# Patient Record
Sex: Male | Born: 1945
Health system: Southern US, Community
[De-identification: ages and names within clinical notes are randomized; demographics above are authoritative.]

## PROBLEM LIST (undated history)

## (undated) DIAGNOSIS — Z720 Tobacco use: Secondary | ICD-10-CM

## (undated) DIAGNOSIS — E559 Vitamin D deficiency, unspecified: Secondary | ICD-10-CM

## (undated) DIAGNOSIS — I10 Essential (primary) hypertension: Secondary | ICD-10-CM

## (undated) DIAGNOSIS — E119 Type 2 diabetes mellitus without complications: Secondary | ICD-10-CM

## (undated) DIAGNOSIS — E785 Hyperlipidemia, unspecified: Secondary | ICD-10-CM

## (undated) HISTORY — DX: Tobacco use: Z72.0

## (undated) HISTORY — DX: Vitamin D deficiency, unspecified: E55.9

## (undated) HISTORY — DX: Essential (primary) hypertension: I10

## (undated) HISTORY — DX: Type 2 diabetes mellitus without complications: E11.9

## (undated) HISTORY — PX: CATARACT EXTRACTION: SUR2

## (undated) HISTORY — DX: Hyperlipidemia, unspecified: E78.5

---

## 2013-05-15 ENCOUNTER — Ambulatory Visit (INDEPENDENT_AMBULATORY_CARE_PROVIDER_SITE_OTHER): Payer: Medicare Other | Admitting: Family Medicine

## 2013-05-15 ENCOUNTER — Encounter: Payer: Self-pay | Admitting: Family Medicine

## 2013-05-15 VITALS — BP 143/87 | HR 74 | Temp 97.6°F | Ht 70.0 in | Wt 186.0 lb

## 2013-05-15 DIAGNOSIS — E119 Type 2 diabetes mellitus without complications: Secondary | ICD-10-CM

## 2013-05-15 DIAGNOSIS — E785 Hyperlipidemia, unspecified: Secondary | ICD-10-CM

## 2013-05-15 DIAGNOSIS — I10 Essential (primary) hypertension: Secondary | ICD-10-CM

## 2013-05-15 LAB — COMPLETE METABOLIC PANEL WITH GFR
ALT: 19 U/L (ref 0–53)
AST: 17 U/L (ref 0–37)
Albumin: 4.6 g/dL (ref 3.5–5.2)
Alkaline Phosphatase: 53 U/L (ref 39–117)
BUN: 12 mg/dL (ref 6–23)
CO2: 28 mEq/L (ref 19–32)
Calcium: 9.2 mg/dL (ref 8.4–10.5)
Chloride: 91 mEq/L — ABNORMAL LOW (ref 96–112)
Creat: 0.8 mg/dL (ref 0.50–1.35)
GFR, Est African American: >89 mL/min
GFR, Est Non African American: >89 mL/min
Glucose, Bld: 107 mg/dL — ABNORMAL HIGH (ref 70–99)
Potassium: 5.1 mEq/L (ref 3.5–5.3)
Sodium: 130 mEq/L — ABNORMAL LOW (ref 135–145)
Total Bilirubin: 0.6 mg/dL (ref 0.3–1.2)
Total Protein: 7.2 g/dL (ref 6.0–8.3)

## 2013-05-15 LAB — POCT GLYCOSYLATED HEMOGLOBIN (HGB A1C): Hemoglobin A1C: 6.1

## 2013-05-15 MED ORDER — GLIPIZIDE 5 MG PO TABS: 5.0000 mg | ORAL_TABLET | Freq: Two times a day (BID) | ORAL | Status: DC

## 2013-05-15 MED ORDER — METOPROLOL TARTRATE 50 MG PO TABS: 50.0000 mg | ORAL_TABLET | Freq: Two times a day (BID) | ORAL | Status: DC

## 2013-05-15 MED ORDER — SITAGLIPTIN PHOSPHATE 100 MG PO TABS: 100.0000 mg | ORAL_TABLET | Freq: Every day | ORAL | Status: DC

## 2013-05-15 MED ORDER — AMLODIPINE BESYLATE 5 MG PO TABS: 5.0000 mg | ORAL_TABLET | Freq: Every day | ORAL | Status: DC

## 2013-05-15 NOTE — Progress Notes (Signed)
Patient ID: Ray Carlson, male   DOB: 1946/04/26, 67 y.o.   MRN: [id number] SUBJECTIVE: Chief Complaint  Patient presents with  . Diabetes    3-4 MO F/U acm PT  . Hypertension  . Hypothyroidism     HPI: Patient is here for follow up of Diabetes Mellitus/hypertension/hyperlipidemia: Symptoms of DM: Denies Nocturia ,Denies Urinary Frequency , denies Blurred vision ,deniesDizziness,denies.Dysuria,denies paresthesias, denies extremity pain or ulcers.Marland Kitchendenies chest pain. has had an annual eye exam. do check the feet. Does check CBGs. Average CBG:135 this am Denies episodes of hypoglycemia. Does have an emergency hypoglycemic plan. admits toCompliance with medications. Denies Problems with medications.   PMH/PSH: reviewed/updated in Epic  SH/FH: reviewed/updated in Epic  Allergies: reviewed/updated in Epic  Medications: reviewed/updated in Epic  Immunizations: reviewed/updated in Epic  ROS: As above in the HPI. All other systems are stable or negative.  OBJECTIVE: APPEARANCE:  Patient in no acute distress.The patient appeared well nourished and normally developed. Acyanotic. Waist: VITAL SIGNS:BP 143/87  Pulse 74  Temp(Src) 97.6 F (36.4 C) (Oral)  Ht 5\' 10"  (1.778 m)  Wt 186 lb (84.369 kg)  BMI 26.69 kg/m2   SKIN: warm and  Dry without overt rashes, tattoos and scars  HEAD and Neck: without JVD, Head and scalp: normal Eyes:No scleral icterus. Fundi normal, eye movements normal. Ears: Auricle normal, canal normal, Tympanic membranes normal, insufflation normal. Nose: normal Throat: normal Neck & thyroid: normal  CHEST & LUNGS: Chest wall: normal Lungs: Clear  CVS: Reveals the PMI to be normally located. Regular rhythm, First and Second Heart sounds are normal,  absence of murmurs, rubs or gallops. Peripheral vasculature: Radial pulses: normal Dorsal pedis pulses: normal Posterior pulses: normal  ABDOMEN:  Appearance: normal Benign, no organomegaly, no  masses, no Abdominal Aortic enlargement. No Guarding , no rebound. No Bruits. Bowel sounds: normal  RECTAL: N/A GU: N/A  EXTREMETIES: nonedematous. Both Femoral and Pedal pulses are normal.  MUSCULOSKELETAL:  Spine: normal Joints: intact  NEUROLOGIC: oriented to time,place and person; nonfocal. Strength is normal Sensory is normal Reflexes are normal Cranial Nerves are normal.  ASSESSMENT: DM (diabetes mellitus) - Plan: POCT glycosylated hemoglobin (Hb A1C), COMPLETE METABOLIC PANEL WITH GFR, sitaGLIPtin (JANUVIA) 100 MG tablet, glipiZIDE (GLUCOTROL) 5 MG tablet, CANCELED: POCT UA - Microalbumin  HTN (hypertension) - Plan: COMPLETE METABOLIC PANEL WITH GFR, metoprolol (LOPRESSOR) 50 MG tablet, amLODipine (NORVASC) 5 MG tablet  HLD (hyperlipidemia) - Plan: COMPLETE METABOLIC PANEL WITH GFR, NMR Lipoprofile with Lipids  Patient doesn't have a Dx of hypothyroidism  PLAN: Orders Placed This Encounter  Procedures  . COMPLETE METABOLIC PANEL WITH GFR  . NMR Lipoprofile with Lipids  . POCT glycosylated hemoglobin (Hb A1C)   Meds ordered this encounter  Medications  . DISCONTD: amLODipine (NORVASC) 5 MG tablet    Sig: Take 1 tablet by mouth daily.  Marland Kitchen DISCONTD: glipiZIDE (GLUCOTROL) 5 MG tablet    Sig: Take 1 tablet by mouth 2 (two) times daily.  Marland Kitchen BAYER CONTOUR TEST test strip    Sig:   . lisinopril-hydrochlorothiazide (PRINZIDE,ZESTORETIC) 20-12.5 MG per tablet    Sig: Take 1 tablet by mouth daily.  . metFORMIN (GLUCOPHAGE) 500 MG tablet    Sig: Take 1 tablet by mouth 2 (two) times daily.  Marland Kitchen DISCONTD: metoprolol (LOPRESSOR) 50 MG tablet    Sig: Take 1 tablet by mouth 2 (two) times daily.  . pravastatin (PRAVACHOL) 40 MG tablet    Sig: Take 1 tablet by mouth at bedtime.  Marland Kitchen  DISCONTD: JANUVIA 100 MG tablet    Sig: Take 1 tablet by mouth daily.  . Cholecalciferol (VITAMIN D) 2000 UNITS CAPS    Sig: Take 2,000 Units by mouth daily.  . metoprolol (LOPRESSOR) 50 MG tablet     Sig: Take 1 tablet (50 mg total) by mouth 2 (two) times daily.    Dispense:  60 tablet    Refill:  11  . sitaGLIPtin (JANUVIA) 100 MG tablet    Sig: Take 1 tablet (100 mg total) by mouth daily.    Dispense:  30 tablet    Refill:  11  . amLODipine (NORVASC) 5 MG tablet    Sig: Take 1 tablet (5 mg total) by mouth daily.    Dispense:  30 tablet    Refill:  11  . glipiZIDE (GLUCOTROL) 5 MG tablet    Sig: Take 1 tablet (5 mg total) by mouth 2 (two) times daily.    Dispense:  60 tablet    Refill:  11   Results for orders placed in visit on 05/15/13  POCT GLYCOSYLATED HEMOGLOBIN (HGB A1C)      Result Value Range   Hemoglobin A1C 6.1%     Discussed goals of therapy  Return in about 3 months (around 08/15/2013).  Nayelli Inglis P. Modesto Charon, M.D.

## 2013-05-16 ENCOUNTER — Other Ambulatory Visit: Payer: Self-pay | Admitting: Family Medicine

## 2013-05-16 DIAGNOSIS — E871 Hypo-osmolality and hyponatremia: Secondary | ICD-10-CM

## 2013-05-16 LAB — NMR LIPOPROFILE WITH LIPIDS
Cholesterol, Total: 116 mg/dL (ref <200)
HDL Particle Number: 26.6 umol/L — ABNORMAL LOW (ref >=30.5)
HDL Size: 9 nm — ABNORMAL LOW (ref >=9.2)
HDL-C: 32 mg/dL — ABNORMAL LOW (ref >=40)
LDL (calc): 54 mg/dL (ref <100)
LDL Particle Number: 1001 nmol/L — ABNORMAL HIGH (ref <1000)
LDL Size: 19.8 nm — ABNORMAL LOW (ref >20.5)
LP-IR Score: 75 — ABNORMAL HIGH (ref <=45)
Large HDL-P: 3.7 umol/L — ABNORMAL LOW (ref >=4.8)
Large VLDL-P: 6.4 nmol/L — ABNORMAL HIGH (ref <=2.7)
Small LDL Particle Number: 809 nmol/L — ABNORMAL HIGH (ref <=527)
Triglycerides: 148 mg/dL (ref <150)
VLDL Size: 61.6 nm — ABNORMAL HIGH (ref <=46.6)

## 2013-05-16 NOTE — Progress Notes (Signed)
Quick Note:  Lab result at goal.for DM and lipids. However, the sodium is very low. Will need to to recheck this in 1 week. Ordered in EPIC. No change in Medications for now.  ______

## 2013-05-29 ENCOUNTER — Telehealth: Payer: Self-pay | Admitting: Family Medicine

## 2013-05-29 MED ORDER — METFORMIN HCL 500 MG PO TABS: 500.0000 mg | ORAL_TABLET | Freq: Two times a day (BID) | ORAL | Status: DC

## 2013-05-29 NOTE — Telephone Encounter (Signed)
done

## 2013-05-30 ENCOUNTER — Telehealth: Payer: Self-pay | Admitting: Family Medicine

## 2013-05-30 MED ORDER — LISINOPRIL-HYDROCHLOROTHIAZIDE 20-12.5 MG PO TABS: 1.0000 | ORAL_TABLET | Freq: Every day | ORAL | Status: DC

## 2013-05-30 NOTE — Telephone Encounter (Signed)
done

## 2013-05-31 ENCOUNTER — Telehealth: Payer: Self-pay | Admitting: Family Medicine

## 2013-07-16 ENCOUNTER — Other Ambulatory Visit: Payer: Self-pay | Admitting: *Deleted

## 2013-07-16 MED ORDER — PRAVASTATIN SODIUM 40 MG PO TABS: 40.0000 mg | ORAL_TABLET | Freq: Every day | ORAL | Status: DC

## 2013-08-16 ENCOUNTER — Ambulatory Visit (INDEPENDENT_AMBULATORY_CARE_PROVIDER_SITE_OTHER): Payer: Medicare Other | Admitting: Family Medicine

## 2013-08-16 ENCOUNTER — Encounter: Payer: Self-pay | Admitting: Family Medicine

## 2013-08-16 VITALS — BP 129/82 | HR 85 | Temp 98.9°F | Ht 71.0 in | Wt 182.4 lb

## 2013-08-16 DIAGNOSIS — F17200 Nicotine dependence, unspecified, uncomplicated: Secondary | ICD-10-CM

## 2013-08-16 DIAGNOSIS — E1159 Type 2 diabetes mellitus with other circulatory complications: Secondary | ICD-10-CM | POA: Insufficient documentation

## 2013-08-16 DIAGNOSIS — I10 Essential (primary) hypertension: Secondary | ICD-10-CM

## 2013-08-16 DIAGNOSIS — E1169 Type 2 diabetes mellitus with other specified complication: Secondary | ICD-10-CM | POA: Insufficient documentation

## 2013-08-16 DIAGNOSIS — E785 Hyperlipidemia, unspecified: Secondary | ICD-10-CM

## 2013-08-16 DIAGNOSIS — Z720 Tobacco use: Secondary | ICD-10-CM | POA: Insufficient documentation

## 2013-08-16 DIAGNOSIS — I152 Hypertension secondary to endocrine disorders: Secondary | ICD-10-CM | POA: Insufficient documentation

## 2013-08-16 DIAGNOSIS — E119 Type 2 diabetes mellitus without complications: Secondary | ICD-10-CM

## 2013-08-16 DIAGNOSIS — E559 Vitamin D deficiency, unspecified: Secondary | ICD-10-CM

## 2013-08-16 LAB — POCT GLYCOSYLATED HEMOGLOBIN (HGB A1C): Hemoglobin A1C: 6.1

## 2013-08-16 NOTE — Progress Notes (Signed)
Patient ID: Ray Carlson, male   DOB: Feb 14, 1946, 67 y.o.   MRN: [id number] SUBJECTIVE: CC: Chief Complaint  Patient presents with  . Follow-up    3 month follow up chronic problems refill metformin, pravastatin    HPI: Patient is here for follow up of Diabetes Mellitus/HLD/HTN: Symptoms evaluated: Denies Nocturia ,Denies Urinary Frequency , denies Blurred vision ,deniesDizziness,denies.Dysuria,denies paresthesias, denies extremity pain or ulcers.Marland Kitchendenies chest pain. has had an annual eye exam. do check the feet. Does check CBGs. Average WUJ:WJXBJY check regularly Denies episodes of hypoglycemia. Does have an emergency hypoglycemic plan. admits toCompliance with medications. Denies Problems with medications.  Tobacco use: continues to smoke at least one daily. Not ready to tally stop  Past Medical History  Diagnosis Date  . Hyperlipidemia   . Hypertension   . Diabetes mellitus without complication   . Tobacco user   . Vitamin D deficiency    No past surgical history on file. History   Social History  . Marital Status: Divorced    Spouse Name: N/A    Number of Children: N/A  . Years of Education: N/A   Occupational History  . Not on file.   Social History Main Topics  . Smoking status: Current Every Day Smoker -- 0.25 packs/day    Types: Cigarettes  . Smokeless tobacco: Not on file  . Alcohol Use: No  . Drug Use: No  . Sexual Activity: Not on file   Other Topics Concern  . Not on file   Social History Narrative  . No narrative on file   Family History  Problem Relation Age of Onset  . Hypertension Mother   . Heart disease Mother   . Hypertension Father    Current Outpatient Prescriptions on File Prior to Visit  Medication Sig Dispense Refill  . amLODipine (NORVASC) 5 MG tablet Take 1 tablet (5 mg total) by mouth daily.  30 tablet  11  . BAYER CONTOUR TEST test strip       . Cholecalciferol (VITAMIN D) 2000 UNITS CAPS Take 2,000 Units by mouth daily.       Marland Kitchen glipiZIDE (GLUCOTROL) 5 MG tablet Take 1 tablet (5 mg total) by mouth 2 (two) times daily.  60 tablet  11  . lisinopril-hydrochlorothiazide (PRINZIDE,ZESTORETIC) 20-12.5 MG per tablet Take 1 tablet by mouth daily.  30 tablet  5  . metFORMIN (GLUCOPHAGE) 500 MG tablet Take 1 tablet (500 mg total) by mouth 2 (two) times daily.  60 tablet  2  . metoprolol (LOPRESSOR) 50 MG tablet Take 1 tablet (50 mg total) by mouth 2 (two) times daily.  60 tablet  11  . pravastatin (PRAVACHOL) 40 MG tablet Take 1 tablet (40 mg total) by mouth at bedtime.  30 tablet  2  . sitaGLIPtin (JANUVIA) 100 MG tablet Take 1 tablet (100 mg total) by mouth daily.  30 tablet  11   No current facility-administered medications on file prior to visit.   Allergies  Allergen Reactions  . Penicillins    Immunization History  Administered Date(s) Administered  . Pneumococcal Polysaccharide 08/12/2010   Prior to Admission medications   Medication Sig Start Date End Date Taking? Authorizing Provider  amLODipine (NORVASC) 5 MG tablet Take 1 tablet (5 mg total) by mouth daily. 05/15/13  Yes Ileana Ladd, MD  BAYER CONTOUR TEST test strip  02/25/13  Yes Historical Provider, MD  Cholecalciferol (VITAMIN D) 2000 UNITS CAPS Take 2,000 Units by mouth daily.   Yes Historical Provider, MD  glipiZIDE (GLUCOTROL) 5 MG tablet Take 1 tablet (5 mg total) by mouth 2 (two) times daily. 05/15/13  Yes Ileana Ladd, MD  lisinopril-hydrochlorothiazide (PRINZIDE,ZESTORETIC) 20-12.5 MG per tablet Take 1 tablet by mouth daily. 05/30/13  Yes Ileana Ladd, MD  metFORMIN (GLUCOPHAGE) 500 MG tablet Take 1 tablet (500 mg total) by mouth 2 (two) times daily. 05/29/13  Yes Ileana Ladd, MD  metoprolol (LOPRESSOR) 50 MG tablet Take 1 tablet (50 mg total) by mouth 2 (two) times daily. 05/15/13  Yes Ileana Ladd, MD  pravastatin (PRAVACHOL) 40 MG tablet Take 1 tablet (40 mg total) by mouth at bedtime. 07/16/13  Yes Ernestina Penna, MD  sitaGLIPtin (JANUVIA) 100  MG tablet Take 1 tablet (100 mg total) by mouth daily. 05/15/13  Yes Ileana Ladd, MD    ROS: As above in the HPI. All other systems are stable or negative.  OBJECTIVE: APPEARANCE:  Patient in no acute distress.The patient appeared well nourished and normally developed. Acyanotic. Waist: VITAL SIGNS:BP 129/82  Pulse 85  Temp(Src) 98.9 F (37.2 C) (Oral)  Ht 5\' 11"  (1.803 m)  Wt 182 lb 6.4 oz (82.736 kg)  BMI 25.45 kg/m2  WM  Ambulates with a cane    SKIN: warm and  Dry without overt rashes, tattoos and scars  HEAD and Neck: without JVD, Head and scalp: normal Eyes:No scleral icterus. Fundi normal, eye movements normal. Ears: Auricle normal, canal normal, Tympanic membranes normal, insufflation normal. Nose: normal Throat: normal Neck & thyroid: normal  CHEST & LUNGS: Chest wall: normal Lungs: Clear  CVS: Reveals the PMI to be normally located. Regular rhythm, First and Second Heart sounds are normal,  absence of murmurs, rubs or gallops. Peripheral vasculature: Radial pulses: normal Dorsal pedis pulses: normal Posterior pulses: normal  ABDOMEN:  Appearance: normal Benign, no organomegaly, no masses, no Abdominal Aortic enlargement. No Guarding , no rebound. No Bruits. Bowel sounds: normal  RECTAL: N/A GU: N/A  EXTREMETIES: nonedematous.  MUSCULOSKELETAL:  Spine: normal Joints: intact  NEUROLOGIC: oriented to time,place and person; nonfocal.  Results for orders placed in visit on 05/15/13  COMPLETE METABOLIC PANEL WITH GFR      Result Value Range   Sodium 130 (*) 135 - 145 mEq/L   Potassium 5.1  3.5 - 5.3 mEq/L   Chloride 91 (*) 96 - 112 mEq/L   CO2 28  19 - 32 mEq/L   Glucose, Bld 107 (*) 70 - 99 mg/dL   BUN 12  6 - 23 mg/dL   Creat 1.02  7.25 - 3.66 mg/dL   Total Bilirubin 0.6  0.3 - 1.2 mg/dL   Alkaline Phosphatase 53  39 - 117 U/L   AST 17  0 - 37 U/L   ALT 19  0 - 53 U/L   Total Protein 7.2  6.0 - 8.3 g/dL   Albumin 4.6  3.5 - 5.2  g/dL   Calcium 9.2  8.4 - 44.0 mg/dL   GFR, Est African American >89     GFR, Est Non African American >89    NMR LIPOPROFILE WITH LIPIDS      Result Value Range   LDL Particle Number 1001 (*) <1000 nmol/L   LDL (calc) 54  <100 mg/dL   HDL-C 32 (*) >=34 mg/dL   Triglycerides 742  <595 mg/dL   Cholesterol, Total 638  <200 mg/dL   HDL Particle Number 75.6 (*) >=30.5 umol/L   Large HDL-P 3.7 (*) >=4.8 umol/L   Large  VLDL-P 6.4 (*) <=2.7 nmol/L   Small LDL Particle Number [id number] (*) <=527 nmol/L   LDL Size 19.8 (*) >20.5 nm   HDL Size 9.0 (*) >=9.2 nm   VLDL Size 61.6 (*) <=46.6 nm   LP-IR Score 75 (*) <=45  POCT GLYCOSYLATED HEMOGLOBIN (HGB A1C)      Result Value Range   Hemoglobin A1C 6.1%      ASSESSMENT: Diabetes mellitus without complication - Plan: POCT glycosylated hemoglobin (Hb A1C), CMP14+EGFR  Hyperlipidemia - Plan: CMP14+EGFR, NMR, lipoprofile  Hypertension - Plan: CMP14+EGFR  Tobacco user  Vitamin D deficiency - Plan: Vit D  25 hydroxy (rtn osteoporosis monitoring)   PLAN: Orders Placed This Encounter  Procedures  . CMP14+EGFR  . NMR, lipoprofile  . Vit D  25 hydroxy (rtn osteoporosis monitoring)  . POCT glycosylated hemoglobin (Hb A1C)         Dr Woodroe Mode Recommendations  For nutrition information, I recommend books:  1).Eat to Live by Dr Monico Hoar. 2).Prevent and Reverse Heart Disease by Dr Suzzette Righter. 3) Dr Katherina Right Book:  Program to Reverse Diabetes  Exercise recommendations are:  If unable to walk, then the patient can exercise in a chair 3 times a day. By flapping arms like a bird gently and raising legs outwards to the front.  If ambulatory, the patient can go for walks for 30 minutes 3 times a week. Then increase the intensity and duration as tolerated.  Goal is to try to attain exercise frequency to 5 times a week.  If applicable: Best to perform resistance exercises (machines or weights) 2 days a week and cardio type  exercises 3 days per week.  Smoking cessation and foot care discussed and handout generated in the AVS. Same medications for now. Await lab results.  Return in about 3 months (around 11/15/2013) for Recheck medical problems.  Kojo Liby P. [place] Charon, M.D.

## 2013-08-16 NOTE — Patient Instructions (Signed)
Smoking Cessation Quitting smoking is important to your health and has many advantages. However, it is not always easy to quit since nicotine is a very addictive drug. Often times, people try 3 times or more before being able to quit. This document explains the best ways for you to prepare to quit smoking. Quitting takes hard work and a lot of effort, but you can do it. ADVANTAGES OF QUITTING SMOKING  You will live longer, feel better, and live better.  Your body will feel the impact of quitting smoking almost immediately.  Within 20 minutes, blood pressure decreases. Your pulse returns to its normal level.  After 8 hours, carbon monoxide levels in the blood return to normal. Your oxygen level increases.  After 24 hours, the chance of having a heart attack starts to decrease. Your breath, hair, and body stop smelling like smoke.  After 48 hours, damaged nerve endings begin to recover. Your sense of taste and smell improve.  After 72 hours, the body is virtually free of nicotine. Your bronchial tubes relax and breathing becomes easier.  After 2 to 12 weeks, lungs can hold more air. Exercise becomes easier and circulation improves.  The risk of having a heart attack, stroke, cancer, or lung disease is greatly reduced.  After 1 year, the risk of coronary heart disease is cut in half.  After 5 years, the risk of stroke falls to the same as a nonsmoker.  After 10 years, the risk of lung cancer is cut in half and the risk of other cancers decreases significantly.  After 15 years, the risk of coronary heart disease drops, usually to the level of a nonsmoker.  If you are pregnant, quitting smoking will improve your chances of having a healthy baby.  The people you live with, especially any children, will be healthier.  You will have extra money to spend on things other than cigarettes. QUESTIONS TO THINK ABOUT BEFORE ATTEMPTING TO QUIT You may want to talk about your answers with your  caregiver.  Why do you want to quit?  If you tried to quit in the past, what helped and what did not?  What will be the most difficult situations for you after you quit? How will you plan to handle them?  Who can help you through the tough times? Your family? Friends? A caregiver?  What pleasures do you get from smoking? What ways can you still get pleasure if you quit? Here are some questions to ask your caregiver:  How can you help me to be successful at quitting?  What medicine do you think would be best for me and how should I take it?  What should I do if I need more help?  What is smoking withdrawal like? How can I get information on withdrawal? GET READY  Set a quit date.  Change your environment by getting rid of all cigarettes, ashtrays, matches, and lighters in your home, car, or work. Do not let people smoke in your home.  Review your past attempts to quit. Think about what worked and what did not. GET SUPPORT AND ENCOURAGEMENT You have a better chance of being successful if you have help. You can get support in many ways.  Tell your family, friends, and co-workers that you are going to quit and need their support. Ask them not to smoke around you.  Get individual, group, or telephone counseling and support. Programs are available at local hospitals and health centers. Call your local health department for   information about programs in your area.  Spiritual beliefs and practices may help some smokers quit.  Download a "quit meter" on your computer to keep track of quit statistics, such as how long you have gone without smoking, cigarettes not smoked, and money saved.  Get a self-help book about quitting smoking and staying off of tobacco. LEARN NEW SKILLS AND BEHAVIORS  Distract yourself from urges to smoke. Talk to someone, go for a walk, or occupy your time with a task.  Change your normal routine. Take a different route to work. Drink tea instead of coffee.  Eat breakfast in a different place.  Reduce your stress. Take a hot bath, exercise, or read a book.  Plan something enjoyable to do every day. Reward yourself for not smoking.  Explore interactive web-based programs that specialize in helping you quit. GET MEDICINE AND USE IT CORRECTLY Medicines can help you stop smoking and decrease the urge to smoke. Combining medicine with the above behavioral methods and support can greatly increase your chances of successfully quitting smoking.  Nicotine replacement therapy helps deliver nicotine to your body without the negative effects and risks of smoking. Nicotine replacement therapy includes nicotine gum, lozenges, inhalers, nasal sprays, and skin patches. Some may be available over-the-counter and others require a prescription.  Antidepressant medicine helps people abstain from smoking, but how this works is unknown. This medicine is available by prescription.  Nicotinic receptor partial agonist medicine simulates the effect of nicotine in your brain. This medicine is available by prescription. Ask your caregiver for advice about which medicines to use and how to use them based on your health history. Your caregiver will tell you what side effects to look out for if you choose to be on a medicine or therapy. Carefully read the information on the package. Do not use any other product containing nicotine while using a nicotine replacement product.  RELAPSE OR DIFFICULT SITUATIONS Most relapses occur within the first 3 months after quitting. Do not be discouraged if you start smoking again. Remember, most people try several times before finally quitting. You may have symptoms of withdrawal because your body is used to nicotine. You may crave cigarettes, be irritable, feel very hungry, cough often, get headaches, or have difficulty concentrating. The withdrawal symptoms are only temporary. They are strongest when you first quit, but they will go away within  10 14 days. To reduce the chances of relapse, try to:  Avoid drinking alcohol. Drinking lowers your chances of successfully quitting.  Reduce the amount of caffeine you consume. Once you quit smoking, the amount of caffeine in your body increases and can give you symptoms, such as a rapid heartbeat, sweating, and anxiety.  Avoid smokers because they can make you want to smoke.  Do not let weight gain distract you. Many smokers will gain weight when they quit, usually less than 10 pounds. Eat a healthy diet and stay active. You can always lose the weight gained after you quit.  Find ways to improve your mood other than smoking. FOR MORE INFORMATION  www.smokefree.gov  Document Released: 11/22/2001 Document Revised: 05/29/2012 Document Reviewed: 03/08/2012 [facility] Patient Information 2014 [place], [place].   Diabetes and Foot Care Diabetes may cause you to have a poor blood supply (circulation) to your legs and feet. Because of this, the skin may be thinner, break easier, and heal more slowly. You also may have nerve damage in your legs and feet causing decreased feeling. You may not notice minor injuries to your  feet that could lead to serious problems or infections. Taking care of your feet is one of the most important things you can do for yourself.  HOME CARE INSTRUCTIONS  Do not go barefoot. Bare feet are easily injured.  Check your feet daily for blisters, cuts, and redness.  Wash your feet with warm water (not hot) and mild soap. Pat your feet and between your toes until completely dry.  Apply a moisturizing lotion that does not contain alcohol or petroleum jelly to the dry skin on your feet and to dry brittle toenails. Do not put it between your toes.  Trim your toenails straight across. Do not dig under them or around the cuticle.  Do not cut corns or calluses, or try to remove them with medicine.  Wear clean cotton socks or stockings every day. Make sure they are not too  tight. Do not wear knee high stockings since they may decrease blood flow to your legs.  Wear leather shoes that fit properly and have enough cushioning. To break in new shoes, wear them just a few hours a day to avoid injuring your feet.  Wear shoes at all times, even in the house.  Do not cross your legs. This may decrease the blood flow to your feet.  If you find a minor scrape, cut, or break in the skin on your feet, keep it and the skin around it clean and dry. These areas may be cleansed with mild soap and water. Do not use peroxide, alcohol, iodine or Merthiolate.  When you remove an adhesive bandage, be sure not to harm the skin around it.  If you have a wound, look at it several times a day to make sure it is healing.  Do not use heating pads or hot water bottles. Burns can occur. If you have lost feeling in your feet or legs, you may not know it is happening until it is too late.  Report any cuts, sores or bruises to your caregiver. Do not wait! SEEK MEDICAL CARE IF:   You have an injury that is not healing or you notice redness, numbness, burning, or tingling.  Your feet always feel cold.  You have pain or cramps in your legs and feet. SEEK IMMEDIATE MEDICAL CARE IF:   There is increasing redness, swelling, or increasing pain in the wound.  There is a red line that goes up your leg.  Pus is coming from a wound.  You develop an unexplained oral temperature above 102 F (38.9 C), or as your caregiver suggests.  You notice a bad smell coming from an ulcer or wound. MAKE SURE YOU:   Understand these instructions.  Will watch your condition.  Will get help right away if you are not doing well or get worse. Document Released: 11/25/2000 Document Revised: 02/20/2012 Document Reviewed: 06/03/2009 [facility] Patient Information 2014 [place], [place].        Dr Woodroe Mode Recommendations  For nutrition information, I recommend books:  1).Eat to Live by Dr Monico Hoar. 2).Prevent and Reverse Heart Disease by Dr Suzzette Righter. 3) Dr Katherina Right Book:  Program to Reverse Diabetes  Exercise recommendations are:  If unable to walk, then the patient can exercise in a chair 3 times a day. By flapping arms like a bird gently and raising legs outwards to the front.  If ambulatory, the patient can go for walks for 30 minutes 3 times a week. Then increase the intensity and duration as tolerated.  Goal  is to try to attain exercise frequency to 5 times a week.  If applicable: Best to perform resistance exercises (machines or weights) 2 days a week and cardio type exercises 3 days per week.

## 2013-08-18 LAB — NMR, LIPOPROFILE
Cholesterol: 115 mg/dL (ref <200)
HDL Cholesterol by NMR: 36 mg/dL — ABNORMAL LOW (ref >=40)
HDL Particle Number: 27.2 umol/L — ABNORMAL LOW (ref >=30.5)
LDL Particle Number: 1206 nmol/L — ABNORMAL HIGH (ref <1000)
LDL Size: 20.2 nm — ABNORMAL LOW (ref >20.5)
LDLC SERPL CALC-MCNC: 59 mg/dL (ref <100)
LP-IR Score: 55 — ABNORMAL HIGH (ref <=45)
Small LDL Particle Number: 840 nmol/L — ABNORMAL HIGH (ref <=527)
Triglycerides by NMR: 102 mg/dL (ref <150)

## 2013-08-18 LAB — CMP14+EGFR
ALT: 21 IU/L (ref 0–44)
AST: 15 IU/L (ref 0–40)
Albumin/Globulin Ratio: 2 (ref 1.1–2.5)
Albumin: 4.9 g/dL — ABNORMAL HIGH (ref 3.6–4.8)
Alkaline Phosphatase: 49 IU/L (ref 39–117)
BUN/Creatinine Ratio: 19 (ref 10–22)
BUN: 15 mg/dL (ref 8–27)
CO2: 25 mmol/L (ref 18–29)
Calcium: 9.8 mg/dL (ref 8.6–10.2)
Chloride: 87 mmol/L — ABNORMAL LOW (ref 97–108)
Creatinine, Ser: 0.8 mg/dL (ref 0.76–1.27)
GFR calc Af Amer: 106 mL/min/{1.73_m2} (ref >59)
GFR calc non Af Amer: 92 mL/min/{1.73_m2} (ref >59)
Globulin, Total: 2.5 g/dL (ref 1.5–4.5)
Glucose: 135 mg/dL — ABNORMAL HIGH (ref 65–99)
Potassium: 4.4 mmol/L (ref 3.5–5.2)
Sodium: 131 mmol/L — ABNORMAL LOW (ref 134–144)
Total Bilirubin: 0.4 mg/dL (ref 0.0–1.2)
Total Protein: 7.4 g/dL (ref 6.0–8.5)

## 2013-08-18 LAB — VITAMIN D 25 HYDROXY (VIT D DEFICIENCY, FRACTURES): Vit D, 25-Hydroxy: 58.5 ng/mL (ref 30.0–100.0)

## 2013-08-21 ENCOUNTER — Other Ambulatory Visit: Payer: Self-pay | Admitting: Family Medicine

## 2013-08-27 ENCOUNTER — Encounter: Payer: Self-pay | Admitting: *Deleted

## 2013-11-11 ENCOUNTER — Other Ambulatory Visit: Payer: Self-pay

## 2013-11-11 MED ORDER — GLUCOSE BLOOD VI STRP: ORAL_STRIP | Status: DC

## 2013-11-11 NOTE — Telephone Encounter (Signed)
Vit D level 08/16/13  58.5 normal

## 2013-11-12 MED ORDER — VITAMIN D 50 MCG (2000 UT) PO CAPS: 2000.0000 [IU] | ORAL_CAPSULE | Freq: Every day | ORAL | Status: DC

## 2013-11-12 NOTE — Telephone Encounter (Signed)
Call patient : Prescription refilled & sent to pharmacy in EPIC. 

## 2013-11-23 ENCOUNTER — Other Ambulatory Visit: Payer: Self-pay | Admitting: Family Medicine

## 2013-11-28 LAB — HM DIABETES EYE EXAM

## 2013-12-03 ENCOUNTER — Encounter: Payer: Self-pay | Admitting: Family Medicine

## 2013-12-03 ENCOUNTER — Ambulatory Visit (INDEPENDENT_AMBULATORY_CARE_PROVIDER_SITE_OTHER): Payer: Medicare Other | Admitting: Family Medicine

## 2013-12-03 VITALS — BP 130/83 | HR 82 | Temp 97.3°F | Ht 71.0 in | Wt 181.0 lb

## 2013-12-03 DIAGNOSIS — Z720 Tobacco use: Secondary | ICD-10-CM

## 2013-12-03 DIAGNOSIS — E559 Vitamin D deficiency, unspecified: Secondary | ICD-10-CM

## 2013-12-03 DIAGNOSIS — I10 Essential (primary) hypertension: Secondary | ICD-10-CM

## 2013-12-03 DIAGNOSIS — F17200 Nicotine dependence, unspecified, uncomplicated: Secondary | ICD-10-CM

## 2013-12-03 DIAGNOSIS — Z23 Encounter for immunization: Secondary | ICD-10-CM

## 2013-12-03 DIAGNOSIS — E119 Type 2 diabetes mellitus without complications: Secondary | ICD-10-CM

## 2013-12-03 DIAGNOSIS — E785 Hyperlipidemia, unspecified: Secondary | ICD-10-CM

## 2013-12-03 MED ORDER — PRAVASTATIN SODIUM 40 MG PO TABS: 40.0000 mg | ORAL_TABLET | Freq: Every day | ORAL | Status: DC

## 2013-12-03 MED ORDER — LISINOPRIL-HYDROCHLOROTHIAZIDE 20-12.5 MG PO TABS: ORAL_TABLET | ORAL | Status: DC

## 2013-12-03 MED ORDER — METFORMIN HCL 500 MG PO TABS: ORAL_TABLET | ORAL | Status: DC

## 2013-12-03 NOTE — Progress Notes (Signed)
Patient ID: Ray Carlson, male   DOB: January 20, 1946, 67 y.o.   MRN: [id number] SUBJECTIVE: CC: Chief Complaint  Patient presents with  . Follow-up    3 month follow up needs refills    HPI: Patient is here for follow up of Diabetes Mellitus/HTN/HLD/Vit D Def/tob user: Symptoms evaluated: Denies Nocturia ,Denies Urinary Frequency , denies Blurred vision ,deniesDizziness,denies.Dysuria,denies paresthesias, denies extremity pain or ulcers.Marland Kitchendenies chest pain. has had an annual eye exam. do check the feet. Does check CBGs. Average CBG:120s Denies episodes of hypoglycemia. Does have an emergency hypoglycemic plan. admits toCompliance with medications. Denies Problems with medications.  Past Medical History  Diagnosis Date  . Hyperlipidemia   . Hypertension   . Diabetes mellitus without complication   . Tobacco user   . Vitamin D deficiency    No past surgical history on file. History   Social History  . Marital Status: Divorced    Spouse Name: N/A    Number of Children: N/A  . Years of Education: N/A   Occupational History  . Not on file.   Social History Main Topics  . Smoking status: Current Every Day Smoker -- 0.25 packs/day    Types: Cigarettes  . Smokeless tobacco: Not on file  . Alcohol Use: No  . Drug Use: No  . Sexual Activity: Not on file   Other Topics Concern  . Not on file   Social History Narrative  . No narrative on file   Family History  Problem Relation Age of Onset  . Hypertension Mother   . Heart disease Mother   . Hypertension Father    Current Outpatient Prescriptions on File Prior to Visit  Medication Sig Dispense Refill  . amLODipine (NORVASC) 5 MG tablet Take 1 tablet (5 mg total) by mouth daily.  30 tablet  11  . BAYER CONTOUR TEST test strip       . Cholecalciferol (VITAMIN D) 2000 UNITS CAPS Take 1 capsule (2,000 Units total) by mouth daily.  30 capsule  0  . glipiZIDE (GLUCOTROL) 5 MG tablet Take 1 tablet (5 mg total) by mouth 2 (two)  times daily.  60 tablet  11  . glucose blood test strip Use as instructed  100 each  0  . metoprolol (LOPRESSOR) 50 MG tablet Take 1 tablet (50 mg total) by mouth 2 (two) times daily.  60 tablet  11  . sitaGLIPtin (JANUVIA) 100 MG tablet Take 1 tablet (100 mg total) by mouth daily.  30 tablet  11   No current facility-administered medications on file prior to visit.   Allergies  Allergen Reactions  . Penicillins    Immunization History  Administered Date(s) Administered  . Influenza-Unspecified 10/12/2013  . Pneumococcal Polysaccharide-23 08/12/2010, 12/03/2013  . Tdap 12/13/2007   Prior to Admission medications   Medication Sig Start Date End Date Taking? Authorizing Provider  amLODipine (NORVASC) 5 MG tablet Take 1 tablet (5 mg total) by mouth daily. 05/15/13   Ileana Ladd, MD  BAYER CONTOUR TEST test strip  02/25/13   Historical Provider, MD  Cholecalciferol (VITAMIN D) 2000 UNITS CAPS Take 1 capsule (2,000 Units total) by mouth daily. 11/11/13   Ileana Ladd, MD  glipiZIDE (GLUCOTROL) 5 MG tablet Take 1 tablet (5 mg total) by mouth 2 (two) times daily. 05/15/13   Ileana Ladd, MD  glucose blood test strip Use as instructed 11/11/13   Ileana Ladd, MD  lisinopril-hydrochlorothiazide (PRINZIDE,ZESTORETIC) 20-12.5 MG per tablet TAKE 1 TABLET ONCE  DAILY. 11/23/13   Ileana Ladd, MD  metFORMIN (GLUCOPHAGE) 500 MG tablet TAKE ONE TABLET TWICE A DAY. 11/23/13   Ileana Ladd, MD  metoprolol (LOPRESSOR) 50 MG tablet Take 1 tablet (50 mg total) by mouth 2 (two) times daily. 05/15/13   Ileana Ladd, MD  pravastatin (PRAVACHOL) 40 MG tablet Take 1 tablet (40 mg total) by mouth at bedtime. 07/16/13   Ernestina Penna, MD  sitaGLIPtin (JANUVIA) 100 MG tablet Take 1 tablet (100 mg total) by mouth daily. 05/15/13   Ileana Ladd, MD     ROS: As above in the HPI. All other systems are stable or negative.  OBJECTIVE: APPEARANCE:  Patient in no acute distress.The patient appeared well  nourished and normally developed. Acyanotic. Waist: VITAL SIGNS:BP 130/83  Pulse 82  Temp(Src) 97.3 F (36.3 C) (Oral)  Ht 5\' 11"  (1.803 m)  Wt 181 lb (82.101 kg)  BMI 25.26 kg/m2  WM   SKIN: warm and  Dry without overt rashes, tattoos and scars  HEAD and Neck: without JVD, Head and scalp: normal Eyes:No scleral icterus. Fundi normal, eye movements normal. Ears: Auricle normal, canal normal, Tympanic membranes normal, insufflation normal. Nose: normal Throat: normal Neck & thyroid: normal  CHEST & LUNGS: Chest wall: normal Lungs: Clear  CVS: Reveals the PMI to be normally located. Regular rhythm, First and Second Heart sounds are normal,  absence of murmurs, rubs or gallops. Peripheral vasculature: Radial pulses: normal Dorsal pedis pulses: normal Posterior pulses: normal  ABDOMEN:  Appearance: normal Benign, no organomegaly, no masses, no Abdominal Aortic enlargement. No Guarding , no rebound. No Bruits. Bowel sounds: normal  RECTAL: N/A GU: N/A  EXTREMETIES: nonedematous.  MUSCULOSKELETAL:  Spine: normal Joints: intact  NEUROLOGIC: oriented to time,place and person; nonfocal. Strength is normal Sensory is normal Reflexes are normal Cranial Nerves are normal.  ASSESSMENT:  Diabetes mellitus without complication - Plan: metFORMIN (GLUCOPHAGE) 500 MG tablet, POCT glycosylated hemoglobin (Hb A1C), POCT UA - Microalbumin, CMP14+EGFR  Hyperlipidemia - Plan: pravastatin (PRAVACHOL) 40 MG tablet, CMP14+EGFR, NMR, lipoprofile  Hypertension - Plan: lisinopril-hydrochlorothiazide (PRINZIDE,ZESTORETIC) 20-12.5 MG per tablet, CMP14+EGFR  Tobacco user  Vitamin D deficiency - Plan: Vit D  25 hydroxy (rtn osteoporosis monitoring)  PLAN: Smoking cessation counseled for 7 minutes and handout in the AVS       Dr Woodroe Mode Recommendations  For nutrition information, I recommend books:  1).Eat to Live by Dr Monico Hoar. 2).Prevent and Reverse Heart  Disease by Dr Suzzette Righter. 3) Dr Katherina Right Book:  Program to Reverse Diabetes  Exercise recommendations are:  If unable to walk, then the patient can exercise in a chair 3 times a day. By flapping arms like a bird gently and raising legs outwards to the front.  If ambulatory, the patient can go for walks for 30 minutes 3 times a week. Then increase the intensity and duration as tolerated.  Goal is to try to attain exercise frequency to 5 times a week.  If applicable: Best to perform resistance exercises (machines or weights) 2 days a week and cardio type exercises 3 days per week.  DM foot care handout in the AVS. Orders Placed This Encounter  Procedures  . Pneumococcal polysaccharide vaccine 23-valent greater than or equal to 2yo subcutaneous/IM  . CMP14+EGFR  . NMR, lipoprofile  . Vit D  25 hydroxy (rtn osteoporosis monitoring)  . POCT glycosylated hemoglobin (Hb A1C)  . POCT UA - Microalbumin   Meds ordered this encounter  Medications  . lisinopril-hydrochlorothiazide (PRINZIDE,ZESTORETIC) 20-12.5 MG per tablet    Sig: TAKE 1 TABLET ONCE DAILY.    Dispense:  30 tablet    Refill:  5  . metFORMIN (GLUCOPHAGE) 500 MG tablet    Sig: TAKE ONE TABLET TWICE A DAY.    Dispense:  60 tablet    Refill:  5  . pravastatin (PRAVACHOL) 40 MG tablet    Sig: Take 1 tablet (40 mg total) by mouth at bedtime.    Dispense:  30 tablet    Refill:  5   Medications Discontinued During This Encounter  Medication Reason  . lisinopril-hydrochlorothiazide (PRINZIDE,ZESTORETIC) 20-12.5 MG per tablet Reorder  . metFORMIN (GLUCOPHAGE) 500 MG tablet Reorder  . pravastatin (PRAVACHOL) 40 MG tablet Reorder   Return in about 4 months (around 04/03/2014) for Recheck medical problems.  Deniro Laymon P. [place]esto Charon, M.D.

## 2013-12-03 NOTE — Patient Instructions (Signed)
Smoking Cessation Quitting smoking is important to your health and has many advantages. However, it is not always easy to quit since nicotine is a very addictive drug. Often times, people try 3 times or more before being able to quit. This document explains the best ways for you to prepare to quit smoking. Quitting takes hard work and a lot of effort, but you can do it. ADVANTAGES OF QUITTING SMOKING  You will live longer, feel better, and live better.  Your body will feel the impact of quitting smoking almost immediately.  Within 20 minutes, blood pressure decreases. Your pulse returns to its normal level.  After 8 hours, carbon monoxide levels in the blood return to normal. Your oxygen level increases.  After 24 hours, the chance of having a heart attack starts to decrease. Your breath, hair, and body stop smelling like smoke.  After 48 hours, damaged nerve endings begin to recover. Your sense of taste and smell improve.  After 72 hours, the body is virtually free of nicotine. Your bronchial tubes relax and breathing becomes easier.  After 2 to 12 weeks, lungs can hold more air. Exercise becomes easier and circulation improves.  The risk of having a heart attack, stroke, cancer, or lung disease is greatly reduced.  After 1 year, the risk of coronary heart disease is cut in half.  After 5 years, the risk of stroke falls to the same as a nonsmoker.  After 10 years, the risk of lung cancer is cut in half and the risk of other cancers decreases significantly.  After 15 years, the risk of coronary heart disease drops, usually to the level of a nonsmoker.  If you are pregnant, quitting smoking will improve your chances of having a healthy baby.  The people you live with, especially any children, will be healthier.  You will have extra money to spend on things other than cigarettes. QUESTIONS TO THINK ABOUT BEFORE ATTEMPTING TO QUIT You may want to talk about your answers with your  caregiver.  Why do you want to quit?  If you tried to quit in the past, what helped and what did not?  What will be the most difficult situations for you after you quit? How will you plan to handle them?  Who can help you through the tough times? Your family? Friends? A caregiver?  What pleasures do you get from smoking? What ways can you still get pleasure if you quit? Here are some questions to ask your caregiver:  How can you help me to be successful at quitting?  What medicine do you think would be best for me and how should I take it?  What should I do if I need more help?  What is smoking withdrawal like? How can I get information on withdrawal? GET READY  Set a quit date.  Change your environment by getting rid of all cigarettes, ashtrays, matches, and lighters in your home, car, or work. Do not let people smoke in your home.  Review your past attempts to quit. Think about what worked and what did not. GET SUPPORT AND ENCOURAGEMENT You have a better chance of being successful if you have help. You can get support in many ways.  Tell your family, friends, and co-workers that you are going to quit and need their support. Ask them not to smoke around you.  Get individual, group, or telephone counseling and support. Programs are available at local hospitals and health centers. Call your local health department for   information about programs in your area.  Spiritual beliefs and practices may help some smokers quit.  Download a "quit meter" on your computer to keep track of quit statistics, such as how long you have gone without smoking, cigarettes not smoked, and money saved.  Get a self-help book about quitting smoking and staying off of tobacco. LEARN NEW SKILLS AND BEHAVIORS  Distract yourself from urges to smoke. Talk to someone, go for a walk, or occupy your time with a task.  Change your normal routine. Take a different route to work. Drink tea instead of coffee.  Eat breakfast in a different place.  Reduce your stress. Take a hot bath, exercise, or read a book.  Plan something enjoyable to do every day. Reward yourself for not smoking.  Explore interactive web-based programs that specialize in helping you quit. GET MEDICINE AND USE IT CORRECTLY Medicines can help you stop smoking and decrease the urge to smoke. Combining medicine with the above behavioral methods and support can greatly increase your chances of successfully quitting smoking.  Nicotine replacement therapy helps deliver nicotine to your body without the negative effects and risks of smoking. Nicotine replacement therapy includes nicotine gum, lozenges, inhalers, nasal sprays, and skin patches. Some may be available over-the-counter and others require a prescription.  Antidepressant medicine helps people abstain from smoking, but how this works is unknown. This medicine is available by prescription.  Nicotinic receptor partial agonist medicine simulates the effect of nicotine in your brain. This medicine is available by prescription. Ask your caregiver for advice about which medicines to use and how to use them based on your health history. Your caregiver will tell you what side effects to look out for if you choose to be on a medicine or therapy. Carefully read the information on the package. Do not use any other product containing nicotine while using a nicotine replacement product.  RELAPSE OR DIFFICULT SITUATIONS Most relapses occur within the first 3 months after quitting. Do not be discouraged if you start smoking again. Remember, most people try several times before finally quitting. You may have symptoms of withdrawal because your body is used to nicotine. You may crave cigarettes, be irritable, feel very hungry, cough often, get headaches, or have difficulty concentrating. The withdrawal symptoms are only temporary. They are strongest when you first quit, but they will go away within  10 14 days. To reduce the chances of relapse, try to:  Avoid drinking alcohol. Drinking lowers your chances of successfully quitting.  Reduce the amount of caffeine you consume. Once you quit smoking, the amount of caffeine in your body increases and can give you symptoms, such as a rapid heartbeat, sweating, and anxiety.  Avoid smokers because they can make you want to smoke.  Do not let weight gain distract you. Many smokers will gain weight when they quit, usually less than 10 pounds. Eat a healthy diet and stay active. You can always lose the weight gained after you quit.  Find ways to improve your mood other than smoking. FOR MORE INFORMATION  www.smokefree.gov  Document Released: 11/22/2001 Document Revised: 05/29/2012 Document Reviewed: 03/08/2012 [employer] Patient Information 2014 [employer].        Dr Artist Bloom's Recommendations  For nutrition information, I recommend books:  1).Eat to Live by Dr Joel Fuhrman. 2).Prevent and Reverse Heart Disease by Dr Caldwell Esselstyn. 3) Dr Neal Barnard's Book:  Program to Reverse Diabetes  Exercise recommendations are:  If unable to walk, then the patient can   exercise in a chair 3 times a day. By flapping arms like a bird gently and raising legs outwards to the front.  If ambulatory, the patient can go for walks for 30 minutes 3 times a week. Then increase the intensity and duration as tolerated.  Goal is to try to attain exercise frequency to 5 times a week.  If applicable: Best to perform resistance exercises (machines or weights) 2 days a week and cardio type exercises 3 days per week.  Diabetes and Foot Care Diabetes may cause you to have problems because of poor blood supply (circulation) to your feet and legs. This may cause the skin on your feet to become thinner, break easier, and heal more slowly. Your skin may become dry, and the skin may peel and crack. You may also have nerve damage in your legs and feet causing  decreased feeling in them. You may not notice minor injuries to your feet that could lead to infections or more serious problems. Taking care of your feet is one of the most important things you can do for yourself.  HOME CARE INSTRUCTIONS  Wear shoes at all times, even in the house. Do not go barefoot. Bare feet are easily injured.  Check your feet daily for blisters, cuts, and redness. If you cannot see the bottom of your feet, use a mirror or ask someone for help.  Wash your feet with warm water (do not use hot water) and mild soap. Then pat your feet and the areas between your toes until they are completely dry. Do not soak your feet as this can dry your skin.  Apply a moisturizing lotion or petroleum jelly (that does not contain alcohol and is unscented) to the skin on your feet and to dry, brittle toenails. Do not apply lotion between your toes.  Trim your toenails straight across. Do not dig under them or around the cuticle. File the edges of your nails with an emery board or nail file.  Do not cut corns or calluses or try to remove them with medicine.  Wear clean socks or stockings every day. Make sure they are not too tight. Do not wear knee-high stockings since they may decrease blood flow to your legs.  Wear shoes that fit properly and have enough cushioning. To break in new shoes, wear them for just a few hours a day. This prevents you from injuring your feet. Always look in your shoes before you put them on to be sure there are no objects inside.  Do not cross your legs. This may decrease the blood flow to your feet.  If you find a minor scrape, cut, or break in the skin on your feet, keep it and the skin around it clean and dry. These areas may be cleansed with mild soap and water. Do not cleanse the area with peroxide, alcohol, or iodine.  When you remove an adhesive bandage, be sure not to damage the skin around it.  If you have a wound, look at it several times a day to make  sure it is healing.  Do not use heating pads or hot water bottles. They may burn your skin. If you have lost feeling in your feet or legs, you may not know it is happening until it is too late.  Make sure your health care provider performs a complete foot exam at least annually or more often if you have foot problems. Report any cuts, sores, or bruises to your health care provider  immediately. SEEK MEDICAL CARE IF:   You have an injury that is not healing.  You have cuts or breaks in the skin.  You have an ingrown nail.  You notice redness on your legs or feet.  You feel burning or tingling in your legs or feet.  You have pain or cramps in your legs and feet.  Your legs or feet are numb.  Your feet always feel cold. SEEK IMMEDIATE MEDICAL CARE IF:   There is increasing redness, swelling, or pain in or around a wound.  There is a red line that goes up your leg.  Pus is coming from a wound.  You develop a fever or as directed by your health care provider.  You notice a bad smell coming from an ulcer or wound. Document Released: 11/25/2000 Document Revised: 07/31/2013 Document Reviewed: 05/07/2013 [facility] Patient Information 2014 [place], [place].

## 2014-04-03 ENCOUNTER — Encounter: Payer: Self-pay | Admitting: Family Medicine

## 2014-04-03 ENCOUNTER — Ambulatory Visit (INDEPENDENT_AMBULATORY_CARE_PROVIDER_SITE_OTHER): Payer: Medicare Other | Admitting: Family Medicine

## 2014-04-03 VITALS — BP 134/77 | HR 82 | Temp 98.0°F | Ht 72.0 in | Wt 182.0 lb

## 2014-04-03 DIAGNOSIS — E119 Type 2 diabetes mellitus without complications: Secondary | ICD-10-CM

## 2014-04-03 DIAGNOSIS — E785 Hyperlipidemia, unspecified: Secondary | ICD-10-CM

## 2014-04-03 DIAGNOSIS — I10 Essential (primary) hypertension: Secondary | ICD-10-CM

## 2014-04-03 DIAGNOSIS — Z720 Tobacco use: Secondary | ICD-10-CM

## 2014-04-03 DIAGNOSIS — E559 Vitamin D deficiency, unspecified: Secondary | ICD-10-CM

## 2014-04-03 DIAGNOSIS — F17200 Nicotine dependence, unspecified, uncomplicated: Secondary | ICD-10-CM

## 2014-04-03 DIAGNOSIS — R269 Unspecified abnormalities of gait and mobility: Secondary | ICD-10-CM

## 2014-04-03 LAB — POCT GLYCOSYLATED HEMOGLOBIN (HGB A1C): Hemoglobin A1C: 6.3

## 2014-04-03 MED ORDER — LISINOPRIL-HYDROCHLOROTHIAZIDE 20-12.5 MG PO TABS: ORAL_TABLET | ORAL | Status: DC

## 2014-04-03 MED ORDER — METOPROLOL TARTRATE 50 MG PO TABS: 50.0000 mg | ORAL_TABLET | Freq: Two times a day (BID) | ORAL | Status: DC

## 2014-04-03 MED ORDER — GLIPIZIDE 5 MG PO TABS: 5.0000 mg | ORAL_TABLET | Freq: Two times a day (BID) | ORAL | Status: DC

## 2014-04-03 MED ORDER — PRAVASTATIN SODIUM 40 MG PO TABS: 40.0000 mg | ORAL_TABLET | Freq: Every day | ORAL | Status: DC

## 2014-04-03 MED ORDER — GLUCOSE BLOOD VI STRP: ORAL_STRIP | Status: DC

## 2014-04-03 MED ORDER — AMLODIPINE BESYLATE 5 MG PO TABS: 5.0000 mg | ORAL_TABLET | Freq: Every day | ORAL | Status: DC

## 2014-04-03 MED ORDER — SITAGLIPTIN PHOSPHATE 100 MG PO TABS: 100.0000 mg | ORAL_TABLET | Freq: Every day | ORAL | Status: DC

## 2014-04-03 MED ORDER — METFORMIN HCL 500 MG PO TABS: ORAL_TABLET | ORAL | Status: DC

## 2014-04-03 NOTE — Progress Notes (Signed)
Patient ID: Ray Carlson, male   DOB: Feb 27, 1946, 68 y.o.   MRN: [id number] SUBJECTIVE: CC: Chief Complaint  Patient presents with  . Follow-up    4 month reck chronic problems alert and oriented to date time place  nneds form completed for [employer]    HPI: Patient is here for follow up of Diabetes Mellitus/HTN/HLD/Tobacco smoking: Symptoms evaluated: Denies Nocturia ,Denies Urinary Frequency , denies Blurred vision ,deniesDizziness,denies.Dysuria,denies paresthesias, denies extremity pain or ulcers.Marland Kitchendenies chest pain. has had an annual eye exam. do check the feet. Does check CBGs. Average CBG:88-90 Denies episodes of hypoglycemia. Does have an emergency hypoglycemic plan. admits toCompliance with medications. Denies Problems with medications.   Breakfast : cereal  : oatmeal with splenda , water; honey Snack: nabbs Lunch: sandwich: cheese mostly Snack: none Supper: last night had a tomato sandwich.wheat bread.  No change in his high stepping gait. Evaluated extensively in the past by Dr Erling Cruz , neurology   Past Medical History  Diagnosis Date  . Hyperlipidemia   . Hypertension   . Diabetes mellitus without complication   . Tobacco user   . Vitamin D deficiency    No past surgical history on file. History   Social History  . Marital Status: Divorced    Spouse Name: N/A    Number of Children: N/A  . Years of Education: N/A   Occupational History  . Not on file.   Social History Main Topics  . Smoking status: Current Every Day Smoker -- 0.25 packs/day    Types: Cigarettes  . Smokeless tobacco: Not on file  . Alcohol Use: No  . Drug Use: No  . Sexual Activity: Not on file   Other Topics Concern  . Not on file   Social History Narrative  . No narrative on file   Family History  Problem Relation Age of Onset  . Hypertension Mother   . Heart disease Mother   . Hypertension Father    Current Outpatient Prescriptions on File Prior to Visit   Medication Sig Dispense Refill  . Cholecalciferol (VITAMIN D) 2000 UNITS CAPS Take 1 capsule (2,000 Units total) by mouth daily.  30 capsule  0   No current facility-administered medications on file prior to visit.   Allergies  Allergen Reactions  . Penicillins    Immunization History  Administered Date(s) Administered  . Influenza-Unspecified 10/12/2013  . Pneumococcal Polysaccharide-23 08/12/2010, 12/03/2013  . Tdap 12/13/2007   Prior to Admission medications   Medication Sig Start Date End Date Taking? Authorizing Provider  amLODipine (NORVASC) 5 MG tablet Take 1 tablet (5 mg total) by mouth daily. 05/15/13  Yes Vernie Shanks, MD  BAYER CONTOUR TEST test strip  02/25/13  Yes Historical Provider, MD  Cholecalciferol (VITAMIN D) 2000 UNITS CAPS Take 1 capsule (2,000 Units total) by mouth daily. 11/11/13  Yes Vernie Shanks, MD  glipiZIDE (GLUCOTROL) 5 MG tablet Take 1 tablet (5 mg total) by mouth 2 (two) times daily. 05/15/13  Yes Vernie Shanks, MD  glucose blood test strip Use as instructed 11/11/13  Yes Vernie Shanks, MD  lisinopril-hydrochlorothiazide (PRINZIDE,ZESTORETIC) 20-12.5 MG per tablet TAKE 1 TABLET ONCE DAILY. 12/03/13  Yes Vernie Shanks, MD  metFORMIN (GLUCOPHAGE) 500 MG tablet TAKE ONE TABLET TWICE A DAY. 12/03/13  Yes Vernie Shanks, MD  metoprolol (LOPRESSOR) 50 MG tablet Take 1 tablet (50 mg total) by mouth 2 (two) times daily. 05/15/13  Yes Vernie Shanks, MD  pravastatin (PRAVACHOL) 40 MG  tablet Take 1 tablet (40 mg total) by mouth at bedtime. 12/03/13  Yes Vernie Shanks, MD  sitaGLIPtin (JANUVIA) 100 MG tablet Take 1 tablet (100 mg total) by mouth daily. 05/15/13  Yes Vernie Shanks, MD     ROS: As above in the HPI. All other systems are stable or negative.  OBJECTIVE: APPEARANCE:  Patient in no acute distress.The patient appeared well nourished and normally developed. Acyanotic. Waist: VITAL SIGNS:BP 134/77  Pulse 82  Temp(Src) 98 F (36.7 C) (Oral)  Ht 6'  (1.829 m)  Wt 182 lb (82.555 kg)  BMI 24.68 kg/m2 WM Ambulates with a cane. SKIN: warm and  Dry without overt rashes, tattoos and scars  HEAD and Neck: without JVD, Head and scalp: normal Eyes:No scleral icterus. Fundi normal, eye movements normal. Ears: Auricle normal, canal normal, Tympanic membranes normal, insufflation normal. Nose: normal Throat: normal Neck & thyroid: normal  CHEST & LUNGS: Chest wall: normal Lungs: Clear  CVS: Reveals the PMI to be normally located. Regular rhythm, First and Second Heart sounds are normal,  absence of murmurs, rubs or gallops. Peripheral vasculature: Radial pulses: normal Dorsal pedis pulses: normal Posterior pulses: normal  ABDOMEN:  Appearance: normal Benign, no organomegaly, no masses, no Abdominal Aortic enlargement. No Guarding , no rebound. No Bruits. Bowel sounds: normal  RECTAL: N/A GU: N/A  EXTREMETIES: nonedematous.  MUSCULOSKELETAL:  Spine: normal Joints: intact  NEUROLOGIC: oriented to time,place and person; nonfocal in extremities High stepping gait. Ambulates with a cane.  Strength is normal Sensory is normal Reflexes are normal Cranial Nerves are normal.  ASSESSMENT:  Tobacco user  Hypertension - Plan: lisinopril-hydrochlorothiazide (PRINZIDE,ZESTORETIC) 20-12.5 MG per tablet  Hyperlipidemia - Plan: CMP14+EGFR, Lipid panel, pravastatin (PRAVACHOL) 40 MG tablet  Diabetes mellitus without complication - Plan: POCT glycosylated hemoglobin (Hb A1C), CMP14+EGFR, metFORMIN (GLUCOPHAGE) 500 MG tablet, glucose blood test strip  Gait disturbance - Plan: Vitamin B12  HTN (hypertension) - Plan: amLODipine (NORVASC) 5 MG tablet, metoprolol (LOPRESSOR) 50 MG tablet  DM (diabetes mellitus) - Plan: glipiZIDE (GLUCOTROL) 5 MG tablet, sitaGLIPtin (JANUVIA) 100 MG tablet  Unspecified vitamin D deficiency - Plan: Vit D  25 hydroxy (rtn osteoporosis monitoring)  PLAN:  Declines AAA screen. Risks for AAA  discussed. Smoking cessation encouraged. Handouts in the AVS Declines zostavax. See handouts. Orders Placed This Encounter  Procedures  . CMP14+EGFR  . Lipid panel  . Vit D  25 hydroxy (rtn osteoporosis monitoring)  . Vitamin B12  . POCT glycosylated hemoglobin (Hb A1C)   Meds ordered this encounter  Medications  . lisinopril-hydrochlorothiazide (PRINZIDE,ZESTORETIC) 20-12.5 MG per tablet    Sig: TAKE 1 TABLET ONCE DAILY.    Dispense:  30 tablet    Refill:  5  . metFORMIN (GLUCOPHAGE) 500 MG tablet    Sig: TAKE ONE TABLET TWICE A DAY.    Dispense:  60 tablet    Refill:  5  . pravastatin (PRAVACHOL) 40 MG tablet    Sig: Take 1 tablet (40 mg total) by mouth at bedtime.    Dispense:  30 tablet    Refill:  5  . amLODipine (NORVASC) 5 MG tablet    Sig: Take 1 tablet (5 mg total) by mouth daily.    Dispense:  30 tablet    Refill:  11  . glipiZIDE (GLUCOTROL) 5 MG tablet    Sig: Take 1 tablet (5 mg total) by mouth 2 (two) times daily.    Dispense:  60 tablet    Refill:  11  . metoprolol (LOPRESSOR) 50 MG tablet    Sig: Take 1 tablet (50 mg total) by mouth 2 (two) times daily.    Dispense:  60 tablet    Refill:  11  . sitaGLIPtin (JANUVIA) 100 MG tablet    Sig: Take 1 tablet (100 mg total) by mouth daily.    Dispense:  30 tablet    Refill:  11  . glucose blood test strip    Sig: Use as instructed    Dispense:  100 each    Refill:  2    Check blood sugar once daily  250.00   Medications Discontinued During This Encounter  Medication Reason  . BAYER CONTOUR TEST test strip   . lisinopril-hydrochlorothiazide (PRINZIDE,ZESTORETIC) 20-12.5 MG per tablet Reorder  . metFORMIN (GLUCOPHAGE) 500 MG tablet Reorder  . pravastatin (PRAVACHOL) 40 MG tablet Reorder  . amLODipine (NORVASC) 5 MG tablet Reorder  . glipiZIDE (GLUCOTROL) 5 MG tablet Reorder  . metoprolol (LOPRESSOR) 50 MG tablet Reorder  . sitaGLIPtin (JANUVIA) 100 MG tablet Reorder  . glucose blood test strip Reorder    Return in about 4 months (around 08/03/2014) for Recheck medical problems.  Yamira Papa P. Jacelyn Grip, M.D.

## 2014-04-04 ENCOUNTER — Other Ambulatory Visit: Payer: Self-pay | Admitting: Family Medicine

## 2014-04-04 DIAGNOSIS — E538 Deficiency of other specified B group vitamins: Secondary | ICD-10-CM | POA: Insufficient documentation

## 2014-04-04 DIAGNOSIS — E871 Hypo-osmolality and hyponatremia: Secondary | ICD-10-CM | POA: Insufficient documentation

## 2014-04-04 LAB — VITAMIN D 25 HYDROXY (VIT D DEFICIENCY, FRACTURES): Vit D, 25-Hydroxy: 56.1 ng/mL (ref 30.0–100.0)

## 2014-04-04 LAB — CMP14+EGFR
ALT: 20 IU/L (ref 0–44)
AST: 19 IU/L (ref 0–40)
Albumin/Globulin Ratio: 2 (ref 1.1–2.5)
Albumin: 4.7 g/dL (ref 3.6–4.8)
Alkaline Phosphatase: 58 IU/L (ref 39–117)
BUN/Creatinine Ratio: 15 (ref 10–22)
BUN: 12 mg/dL (ref 8–27)
CO2: 26 mmol/L (ref 18–29)
Calcium: 9.6 mg/dL (ref 8.6–10.2)
Chloride: 88 mmol/L — ABNORMAL LOW (ref 97–108)
Creatinine, Ser: 0.81 mg/dL (ref 0.76–1.27)
GFR calc Af Amer: 105 mL/min/{1.73_m2} (ref >59)
GFR calc non Af Amer: 91 mL/min/{1.73_m2} (ref >59)
Globulin, Total: 2.4 g/dL (ref 1.5–4.5)
Glucose: 123 mg/dL — ABNORMAL HIGH (ref 65–99)
Potassium: 4.6 mmol/L (ref 3.5–5.2)
Sodium: 129 mmol/L — ABNORMAL LOW (ref 134–144)
Total Bilirubin: 0.6 mg/dL (ref 0.0–1.2)
Total Protein: 7.1 g/dL (ref 6.0–8.5)

## 2014-04-04 LAB — LIPID PANEL
Chol/HDL Ratio: 3.2 ratio units (ref 0.0–5.0)
Cholesterol, Total: 113 mg/dL (ref 100–199)
HDL: 35 mg/dL — ABNORMAL LOW (ref >39)
LDL Calculated: 60 mg/dL (ref 0–99)
Triglycerides: 89 mg/dL (ref 0–149)
VLDL Cholesterol Cal: 18 mg/dL (ref 5–40)

## 2014-04-04 LAB — VITAMIN B12: Vitamin B-12: 84 pg/mL — ABNORMAL LOW (ref 211–946)

## 2014-04-04 NOTE — Progress Notes (Signed)
Quick Note:  Call Patient Labs that are abnormal: Vit B12 very low Sodium level low  The rest are at goal  Recommendations: liberalize salt in diet. Restrict water intake to 1.2Liter per 24 hours. start on Vit B12 tablets 2 mg daily. Recheck sodium in 2 weeks lab ordered in EPIC. Recheck B12 in 4 weeks ordered in Epic.    ______

## 2014-04-28 ENCOUNTER — Ambulatory Visit (INDEPENDENT_AMBULATORY_CARE_PROVIDER_SITE_OTHER): Payer: Medicare Other | Admitting: *Deleted

## 2014-04-28 DIAGNOSIS — E538 Deficiency of other specified B group vitamins: Secondary | ICD-10-CM

## 2014-04-28 DIAGNOSIS — E871 Hypo-osmolality and hyponatremia: Secondary | ICD-10-CM

## 2014-04-28 NOTE — Progress Notes (Signed)
Patient ID: Ray Carlson, male   DOB: [birth year], 68 y.o.   MRN: [id number] ..Marland Kitchen

## 2014-04-28 NOTE — Progress Notes (Signed)
Pt came in for lab  only 

## 2014-04-29 LAB — VITAMIN B12: Vitamin B-12: 607 pg/mL (ref 211–946)

## 2014-04-29 LAB — BMP8+EGFR
BUN/Creatinine Ratio: 16 (ref 10–22)
BUN: 13 mg/dL (ref 8–27)
CO2: 25 mmol/L (ref 18–29)
Calcium: 9.4 mg/dL (ref 8.6–10.2)
Chloride: 90 mmol/L — ABNORMAL LOW (ref 97–108)
Creatinine, Ser: 0.79 mg/dL (ref 0.76–1.27)
GFR calc Af Amer: 106 mL/min/{1.73_m2} (ref >59)
GFR calc non Af Amer: 92 mL/min/{1.73_m2} (ref >59)
Glucose: 127 mg/dL — ABNORMAL HIGH (ref 65–99)
Potassium: 4.6 mmol/L (ref 3.5–5.2)
Sodium: 129 mmol/L — ABNORMAL LOW (ref 134–144)

## 2014-06-23 ENCOUNTER — Ambulatory Visit (INDEPENDENT_AMBULATORY_CARE_PROVIDER_SITE_OTHER): Payer: Medicare Other | Admitting: Family Medicine

## 2014-06-23 ENCOUNTER — Encounter: Payer: Self-pay | Admitting: Family Medicine

## 2014-06-23 VITALS — BP 140/83 | HR 89 | Temp 97.8°F | Ht 72.0 in | Wt 183.6 lb

## 2014-06-23 DIAGNOSIS — L03114 Cellulitis of left upper limb: Secondary | ICD-10-CM

## 2014-06-23 DIAGNOSIS — IMO0002 Reserved for concepts with insufficient information to code with codable children: Secondary | ICD-10-CM

## 2014-06-23 MED ORDER — DOXYCYCLINE HYCLATE 100 MG PO TABS: 100.0000 mg | ORAL_TABLET | Freq: Two times a day (BID) | ORAL | Status: DC

## 2014-06-23 NOTE — Progress Notes (Signed)
   Subjective:    Patient ID: Ray Carlson, male    DOB: 10/25/1946, 68 y.o.   MRN: [id number]  HPI This 68 y.o. male presents for evaluation of swelling and discomfort in his right hand and wrist. He has had this for a few days.  He denies any joint pain or injury.  He has had this in the past and was tx'd with antibiotics.   Review of Systems C/o right hand and wrist swelling   No chest pain, SOB, HA, dizziness, vision change, N/V, diarrhea, constipation, dysuria, urinary urgency or frequency, myalgias, arthralgias or rash.  Objective:   Physical Exam  Vital signs noted  Well developed well nourished male.  HEENT - Head atraumatic Normocephalic Respiratory - Lungs CTA bilateral Cardiac - RRR S1 and S2 without murmur Skin - Erythema and swelling right hand and wrist w/o TTP     Assessment & Plan:  Cellulitis of left upper extremity - Plan: doxycycline (VIBRA-TABS) 100 MG tablet Po bid x 10 days #20.  Follow up if not resolved in a week and prn  Deatra CanterWilliam J Oxford FNP

## 2014-07-01 ENCOUNTER — Encounter: Payer: Self-pay | Admitting: *Deleted

## 2014-08-04 ENCOUNTER — Encounter: Payer: Self-pay | Admitting: Family

## 2014-08-04 ENCOUNTER — Ambulatory Visit (INDEPENDENT_AMBULATORY_CARE_PROVIDER_SITE_OTHER): Payer: Medicare Other | Admitting: *Deleted

## 2014-08-04 ENCOUNTER — Ambulatory Visit (INDEPENDENT_AMBULATORY_CARE_PROVIDER_SITE_OTHER): Payer: Medicare Other | Admitting: Family

## 2014-08-04 ENCOUNTER — Ambulatory Visit (INDEPENDENT_AMBULATORY_CARE_PROVIDER_SITE_OTHER): Payer: Medicare Other

## 2014-08-04 VITALS — BP 137/84 | HR 83 | Temp 97.0°F | Ht 72.0 in | Wt 180.6 lb

## 2014-08-04 DIAGNOSIS — E785 Hyperlipidemia, unspecified: Secondary | ICD-10-CM

## 2014-08-04 DIAGNOSIS — R0602 Shortness of breath: Secondary | ICD-10-CM

## 2014-08-04 DIAGNOSIS — I10 Essential (primary) hypertension: Secondary | ICD-10-CM

## 2014-08-04 DIAGNOSIS — E119 Type 2 diabetes mellitus without complications: Secondary | ICD-10-CM

## 2014-08-04 DIAGNOSIS — E538 Deficiency of other specified B group vitamins: Secondary | ICD-10-CM

## 2014-08-04 DIAGNOSIS — Z23 Encounter for immunization: Secondary | ICD-10-CM

## 2014-08-04 DIAGNOSIS — E559 Vitamin D deficiency, unspecified: Secondary | ICD-10-CM

## 2014-08-04 LAB — POCT GLYCOSYLATED HEMOGLOBIN (HGB A1C): Hemoglobin A1C: 5.9

## 2014-08-04 LAB — POCT UA - MICROALBUMIN: Microalbumin Ur, POC: NEGATIVE mg/L

## 2014-08-04 MED ORDER — LISINOPRIL-HYDROCHLOROTHIAZIDE 20-12.5 MG PO TABS: ORAL_TABLET | ORAL | Status: DC

## 2014-08-04 MED ORDER — PRAVASTATIN SODIUM 40 MG PO TABS: 40.0000 mg | ORAL_TABLET | Freq: Every day | ORAL | Status: DC

## 2014-08-04 MED ORDER — METFORMIN HCL 500 MG PO TABS: ORAL_TABLET | ORAL | Status: DC

## 2014-08-04 MED ORDER — BUDESONIDE-FORMOTEROL FUMARATE 160-4.5 MCG/ACT IN AERO: 2.0000 | INHALATION_SPRAY | Freq: Two times a day (BID) | RESPIRATORY_TRACT | Status: DC

## 2014-08-04 NOTE — Patient Instructions (Signed)
Herpes Zoster Virus Vaccine What is this medicine? HERPES ZOSTER VIRUS VACCINE (HUR peez ZOS ter vahy ruhs vak SEEN) is a vaccine. It is used to prevent shingles in adults 69 years old and over. This vaccine is not used to treat shingles or nerve pain from shingles. This medicine may be used for other purposes; ask your health care provider or pharmacist if you have questions. COMMON BRAND NAME(S): Varivax, Zostavax What should I tell my health care provider before I take this medicine? They need to know if you have any of these conditions: -cancer like leukemia or lymphoma -immune system problems or therapy -infection with fever -tuberculosis -an unusual or allergic reaction to vaccines, neomycin, gelatin, other medicines, foods, dyes, or preservatives -pregnant or trying to get pregnant -breast-feeding How should I use this medicine? This vaccine is for injection under the skin. It is given by a health care professional. Talk to your pediatrician regarding the use of this medicine in children. This medicine is not approved for use in children. Overdosage: If you think you have taken too much of this medicine contact a poison control center or emergency room at once. NOTE: This medicine is only for you. Do not share this medicine with others. What if I miss a dose? This does not apply. What may interact with this medicine? Do not take this medicine with any of the following medications: -adalimumab -anakinra -etanercept -infliximab -medicines to treat cancer -medicines that suppress your immune system This medicine may also interact with the following medications: -immunoglobulins -steroid medicines like prednisone or cortisone This list may not describe all possible interactions. Give your health care provider a list of all the medicines, herbs, non-prescription drugs, or dietary supplements you use. Also tell them if you smoke, drink alcohol, or use illegal drugs. Some items may  interact with your medicine. What should I watch for while using this medicine? Visit your doctor for regular check ups. This vaccine, like all vaccines, may not fully protect everyone. After receiving this vaccine it may be possible to pass chickenpox infection to others. Avoid people with immune system problems, pregnant women who have not had chickenpox, and newborns of women who have not had chickenpox. Talk to your doctor for more information. What side effects may I notice from receiving this medicine? Side effects that you should report to your doctor or health care professional as soon as possible: -allergic reactions like skin rash, itching or hives, swelling of the face, lips, or tongue -breathing problems -feeling faint or lightheaded, falls -fever, flu-like symptoms -pain, tingling, numbness in the hands or feet -swelling of the ankles, feet, hands -unusually weak or tired Side effects that usually do not require medical attention (report to your doctor or health care professional if they continue or are bothersome): -aches or pains -chickenpox-like rash -diarrhea -headache -loss of appetite -nausea, vomiting -redness, pain, swelling at site where injected -runny nose This list may not describe all possible side effects. Call your doctor for medical advice about side effects. You may report side effects to FDA at 1-800-FDA-1088. Where should I keep my medicine? This drug is given in a hospital or clinic and will not be stored at home. NOTE: This sheet is a summary. It may not cover all possible information. If you have questions about this medicine, talk to your doctor, pharmacist, or health care provider.  2015, Elsevier/Gold Standard. (2010-05-17 17:43:50)  

## 2014-08-04 NOTE — Progress Notes (Signed)
Patient ID: Ray Carlson, male   DOB: 26-Sep-1946, 68 y.o.   MRN: [id number] Zostavax given and tolerated well

## 2014-08-04 NOTE — Progress Notes (Signed)
Subjective:    Patient ID: Ray Carlson, male    DOB: 03/18/1946, 68 y.o.   MRN: [id number]  Diabetes He presents for his follow-up diabetic visit. He has type 2 diabetes mellitus. His disease course has been fluctuating. Hypoglycemia symptoms include dizziness. Pertinent negatives for hypoglycemia include no confusion or headaches. Pertinent negatives for diabetes include no blurred vision, no foot paresthesias, no foot ulcerations and no visual change. Symptoms are improving. Pertinent negatives for diabetic complications include no CVA, heart disease, nephropathy or peripheral neuropathy. Risk factors for coronary artery disease include diabetes mellitus, dyslipidemia, hypertension, male sex and sedentary lifestyle. Current diabetic treatment includes oral agent (triple therapy). He is compliant with treatment all of the time. He is following a diabetic diet. His breakfast blood glucose range is generally 90-110 mg/dl. An ACE inhibitor/angiotensin II receptor blocker is being taken. He does not see a podiatrist.Eye exam is current.  Hypertension This is a chronic problem. The current episode started more than 1 year ago. The problem has been resolved since onset. The problem is controlled. Associated symptoms include shortness of breath. Pertinent negatives include no blurred vision, headaches, palpitations or peripheral edema. Risk factors for coronary artery disease include diabetes mellitus, dyslipidemia, male gender, smoking/tobacco exposure and sedentary lifestyle. Past treatments include ACE inhibitors and diuretics. The current treatment provides significant improvement. There is no history of kidney disease, CAD/MI, CVA, heart failure or a thyroid problem. There is no history of sleep apnea.  Hyperlipidemia This is a chronic problem. The current episode started more than 1 year ago. The problem is controlled. Recent lipid tests were reviewed and are normal. Exacerbating diseases include  diabetes. He has no history of hypothyroidism. Associated symptoms include shortness of breath. Pertinent negatives include no focal sensory loss, leg pain or myalgias. Current antihyperlipidemic treatment includes statins. The current treatment provides moderate improvement of lipids. Risk factors for coronary artery disease include diabetes mellitus, dyslipidemia, hypertension, male sex and a sedentary lifestyle.      Review of Systems  Constitutional: Negative.   HENT: Negative.   Eyes: Negative for blurred vision.  Respiratory: Positive for shortness of breath.   Cardiovascular: Negative.  Negative for palpitations.  Gastrointestinal: Negative.   Endocrine: Negative.   Genitourinary: Negative.   Musculoskeletal: Negative.  Negative for myalgias.  Neurological: Positive for dizziness. Negative for headaches.  Hematological: Negative.   Psychiatric/Behavioral: Negative.  Negative for confusion.  All other systems reviewed and are negative.      Objective:   Physical Exam  Vitals reviewed. Constitutional: He is oriented to person, place, and time. He appears well-developed and well-nourished. No distress.  HENT:  Head: Normocephalic.  Right Ear: External ear normal.  Left Ear: External ear normal.  Mouth/Throat: Oropharynx is clear and moist.  Nasal passage erythemas with mild swelling   Eyes: Pupils are equal, round, and reactive to light. Right eye exhibits no discharge. Left eye exhibits no discharge.  Neck: Normal range of motion. Neck supple. No thyromegaly present.  Cardiovascular: Normal rate, regular rhythm, normal heart sounds and intact distal pulses.   No murmur heard. Pulmonary/Chest: Effort normal. No respiratory distress.  Diminished breath sounds nonproductive cough   Abdominal: Soft. Bowel sounds are normal. He exhibits no distension. There is no tenderness.  Musculoskeletal: Normal range of motion. He exhibits no edema and no tenderness.  Neurological: He  is alert and oriented to person, place, and time. He has normal reflexes. No cranial nerve deficit.  Skin: Skin is warm and  dry. No rash noted. No erythema. There is pallor.  Psychiatric: He has a normal mood and affect. His behavior is normal. Judgment and thought content normal.      BP 137/84  Pulse 83  Temp(Src) 97 F (36.1 C) (Oral)  Ht 6' (1.829 m)  Wt 180 lb 9.6 oz (81.92 kg)  BMI 24.49 kg/m2     Assessment & Plan:  1. Essential hypertension - lisinopril-hydrochlorothiazide (PRINZIDE,ZESTORETIC) 20-12.5 MG per tablet; TAKE 1 TABLET ONCE DAILY.  Dispense: 90 tablet; Refill: 3 - CMP14+EGFR  2. Vitamin B 12 deficiency - CMP14+EGFR - Vitamin B12  3. Diabetes mellitus without complication - metFORMIN (GLUCOPHAGE) 500 MG tablet; TAKE ONE TABLET TWICE A DAY.  Dispense: 180 tablet; Refill: 3 - POCT glycosylated hemoglobin (Hb A1C) - CMP14+EGFR - POCT UA - Microalbumin  4. Hyperlipidemia - pravastatin (PRAVACHOL) 40 MG tablet; Take 1 tablet (40 mg total) by mouth at bedtime.  Dispense: 90 tablet; Refill: 3 - CMP14+EGFR - Lipid panel  5. Vitamin D deficiency - CMP14+EGFR - Vit D  25 hydroxy (rtn osteoporosis monitoring)  6. SOB (shortness of breath) on exertion - CMP14+EGFR - budesonide-formoterol (SYMBICORT) 160-4.5 MCG/ACT inhaler; Inhale 2 puffs into the lungs 2 (two) times daily.  Dispense: 1 Inhaler; Refill: 3 - DG Chest 2 View; Future   Continue all meds Labs pending Health Maintenance reviewed Diet and exercise encouraged RTO 4 months  Evelina Dun, FNP

## 2014-08-04 NOTE — Patient Instructions (Addendum)

## 2014-08-05 LAB — CMP14+EGFR
ALT: 17 IU/L (ref 0–44)
AST: 14 IU/L (ref 0–40)
Albumin/Globulin Ratio: 1.6 (ref 1.1–2.5)
Albumin: 4.6 g/dL (ref 3.6–4.8)
Alkaline Phosphatase: 60 IU/L (ref 39–117)
BUN/Creatinine Ratio: 16 (ref 10–22)
BUN: 14 mg/dL (ref 8–27)
CO2: 26 mmol/L (ref 18–29)
Calcium: 9.8 mg/dL (ref 8.6–10.2)
Chloride: 88 mmol/L — ABNORMAL LOW (ref 97–108)
Creatinine, Ser: 0.85 mg/dL (ref 0.76–1.27)
GFR calc Af Amer: 103 mL/min/{1.73_m2} (ref >59)
GFR calc non Af Amer: 89 mL/min/{1.73_m2} (ref >59)
Globulin, Total: 2.9 g/dL (ref 1.5–4.5)
Glucose: 154 mg/dL — ABNORMAL HIGH (ref 65–99)
Potassium: 4.7 mmol/L (ref 3.5–5.2)
Sodium: 132 mmol/L — ABNORMAL LOW (ref 134–144)
Total Bilirubin: 0.5 mg/dL (ref 0.0–1.2)
Total Protein: 7.5 g/dL (ref 6.0–8.5)

## 2014-08-05 LAB — LIPID PANEL
Chol/HDL Ratio: 3.1 ratio units (ref 0.0–5.0)
Cholesterol, Total: 129 mg/dL (ref 100–199)
HDL: 42 mg/dL (ref >39)
LDL Calculated: 70 mg/dL (ref 0–99)
Triglycerides: 85 mg/dL (ref 0–149)
VLDL Cholesterol Cal: 17 mg/dL (ref 5–40)

## 2014-08-05 LAB — VITAMIN D 25 HYDROXY (VIT D DEFICIENCY, FRACTURES): Vit D, 25-Hydroxy: 56.8 ng/mL (ref 30.0–100.0)

## 2014-08-05 LAB — VITAMIN B12: Vitamin B-12: 668 pg/mL (ref 211–946)

## 2014-08-07 ENCOUNTER — Telehealth: Payer: Self-pay | Admitting: Family

## 2014-08-07 NOTE — Telephone Encounter (Signed)
Message copied by MOORE, MITZI on ThDoreatha Massed 2015 10:48 AM ------      Message from: Lendon Colonel, [place]      Created: Wed Aug 06, 2014 12:55 PM       Microalbumin negative      HgbA1C-WNL      Kidney and liver function stable      Cholesterol levels WNL      Vit D & Vit B12 levels WNL ------

## 2014-09-23 ENCOUNTER — Encounter: Payer: Self-pay | Admitting: *Deleted

## 2014-10-14 ENCOUNTER — Ambulatory Visit (INDEPENDENT_AMBULATORY_CARE_PROVIDER_SITE_OTHER): Payer: Medicare Other

## 2014-10-14 DIAGNOSIS — Z23 Encounter for immunization: Secondary | ICD-10-CM

## 2014-12-04 ENCOUNTER — Encounter: Payer: Self-pay | Admitting: Family Medicine

## 2014-12-04 ENCOUNTER — Ambulatory Visit (INDEPENDENT_AMBULATORY_CARE_PROVIDER_SITE_OTHER): Payer: Medicare Other | Admitting: Family Medicine

## 2014-12-04 VITALS — BP 144/87 | HR 96 | Temp 97.0°F | Ht 72.0 in | Wt 185.0 lb

## 2014-12-04 DIAGNOSIS — R269 Unspecified abnormalities of gait and mobility: Secondary | ICD-10-CM

## 2014-12-04 DIAGNOSIS — E119 Type 2 diabetes mellitus without complications: Secondary | ICD-10-CM

## 2014-12-04 DIAGNOSIS — I10 Essential (primary) hypertension: Secondary | ICD-10-CM

## 2014-12-04 DIAGNOSIS — E559 Vitamin D deficiency, unspecified: Secondary | ICD-10-CM

## 2014-12-04 DIAGNOSIS — E538 Deficiency of other specified B group vitamins: Secondary | ICD-10-CM

## 2014-12-04 DIAGNOSIS — E785 Hyperlipidemia, unspecified: Secondary | ICD-10-CM

## 2014-12-04 LAB — POCT GLYCOSYLATED HEMOGLOBIN (HGB A1C): Hemoglobin A1C: 6.1

## 2014-12-04 NOTE — Patient Instructions (Signed)
Medicare Annual Wellness Visit  [place] and the medical providers at [facility] strive to bring you the best medical care.  In doing so we not only want to address your current medical conditions and concerns but also to detect new conditions early and prevent illness, disease and health-related problems.    Medicare offers a yearly Wellness Visit which allows our clinical staff to assess your need for preventative services including immunizations, lifestyle education, counseling to decrease risk of preventable diseases and screening for fall risk and other medical concerns.    This visit is provided free of charge (no copay) for all Medicare recipients. The clinical pharmacists at [facility] have begun to conduct these Wellness Visits which will also include a thorough review of all your medications.    As you primary medical provider recommend that you make an appointment for your Annual Wellness Visit if you have not done so already this year.  You may set up this appointment before you leave today or you may call back ([phone]) and schedule an appointment.  Please make sure when you call that you mention that you are scheduling your Annual Wellness Visit with the clinical pharmacist so that the appointment may be made for the proper length of time.     Continue current medications. Continue good therapeutic lifestyle changes which include good diet and exercise. Fall precautions discussed with patient. If an FOBT was given today- please return it to our front desk. If you are over 50 years old - you may need Prevnar 13 or the adult Pneumonia vaccine.  Flu Shots will be available at our office starting mid- September. Please call and schedule a FLU CLINIC APPOINTMENT.    

## 2014-12-04 NOTE — Progress Notes (Signed)
   Subjective:    Patient ID: Ray Carlson, male    DOB: [birth year], 68 y.o.   MRN: [id number]  HPI Pt here for follow up and management of chronic medical problems. Patient checks his sugars at home and numbers have not been greater than 100 on fasting level. He denies any shortness of breath, chest pain, dependent edema, or symptoms related to neuropathy such as burning pain and numbness et       Patient Active Problem List   Diagnosis Date Noted  . Vitamin B 12 deficiency 04/04/2014  . Hyponatremia 04/04/2014  . Gait disturbance 04/03/2014  . Hyperlipidemia   . Hypertension   . Diabetes mellitus without complication   . Tobacco user   . Vitamin D deficiency    Outpatient Encounter Prescriptions as of 12/04/2014  Medication Sig  . amLODipine (NORVASC) 5 MG tablet Take 1 tablet (5 mg total) by mouth daily.  . budesonide-formoterol (SYMBICORT) 160-4.5 MCG/ACT inhaler Inhale 2 puffs into the lungs 2 (two) times daily.  . Cholecalciferol (VITAMIN D) 2000 UNITS CAPS Take 1 capsule (2,000 Units total) by mouth daily.  . Cyanocobalamin (VITAMIN B 12 PO) Take 1,000 mg by mouth daily.  Marland Kitchen. glipiZIDE (GLUCOTROL) 5 MG tablet Take 1 tablet (5 mg total) by mouth 2 (two) times daily.  Marland Kitchen. glucose blood test strip Use as instructed  . lisinopril-hydrochlorothiazide (PRINZIDE,ZESTORETIC) 20-12.5 MG per tablet TAKE 1 TABLET ONCE DAILY.  . metFORMIN (GLUCOPHAGE) 500 MG tablet TAKE ONE TABLET TWICE A DAY.  . metoprolol (LOPRESSOR) 50 MG tablet Take 1 tablet (50 mg total) by mouth 2 (two) times daily.  . pravastatin (PRAVACHOL) 40 MG tablet Take 1 tablet (40 mg total) by mouth at bedtime.  . sitaGLIPtin (JANUVIA) 100 MG tablet Take 1 tablet (100 mg total) by mouth daily.    Review of Systems  Constitutional: Negative.   HENT: Negative.   Eyes: Negative.   Respiratory: Negative.   Cardiovascular: Negative.   Gastrointestinal: Negative.   Endocrine: Negative.   Genitourinary: Negative.     Musculoskeletal: Negative.   Skin: Negative.   Allergic/Immunologic: Negative.   Neurological: Negative.   Hematological: Negative.   Psychiatric/Behavioral: Negative.        Objective:   Physical Exam BP 144/87 mmHg  Pulse 96  Temp(Src) 97 F (36.1 C) (Oral)  Ht 6' (1.829 m)  Wt 185 lb (83.915 kg)  BMI 25.08 kg/m2  Patient no distress Lungs are clear to auscultation Heart is regular without significant murmurs or gallops Abdomen soft without organomegaly Feet show no edema and normal sensation      Assessment & Plan:   1. Essential hypertension Blood pressure well controlled no changes are recommended today  2. Hyperlipidemia Lipids were checked in August and were at goal we'll check again at next visit  3. Vitamin D deficiency Vitamin D level is in the normal range. Suggest he stay on maintenance 2000 units per day  4. Diabetes mellitus without complication Suspect A1c will be at goal based on his history but pending at the time of this note - POCT glycosylated hemoglobin (Hb A1C)  5. Vitamin B 12 deficiency B12 is in the normal range he could probably stop the supplement but I see no harm in continuing  6. Gait disturbance He denies recent falls he continues to use a walker for ambulation  Frederica KusterStephen M Keyarra Rendall MD

## 2014-12-30 ENCOUNTER — Encounter: Payer: Self-pay | Admitting: *Deleted

## 2015-04-06 ENCOUNTER — Ambulatory Visit: Payer: Self-pay | Admitting: Family Medicine

## 2015-04-08 ENCOUNTER — Ambulatory Visit: Payer: Medicare Other | Admitting: Family Medicine

## 2015-04-10 ENCOUNTER — Other Ambulatory Visit: Payer: Self-pay | Admitting: Family Medicine

## 2015-04-29 ENCOUNTER — Encounter: Payer: Self-pay | Admitting: Family Medicine

## 2015-04-29 ENCOUNTER — Ambulatory Visit (INDEPENDENT_AMBULATORY_CARE_PROVIDER_SITE_OTHER): Payer: Medicare Other | Admitting: Family Medicine

## 2015-04-29 VITALS — BP 141/80 | HR 84 | Temp 97.0°F | Ht 72.0 in | Wt 180.0 lb

## 2015-04-29 DIAGNOSIS — E119 Type 2 diabetes mellitus without complications: Secondary | ICD-10-CM

## 2015-04-29 DIAGNOSIS — I10 Essential (primary) hypertension: Secondary | ICD-10-CM | POA: Diagnosis not present

## 2015-04-29 DIAGNOSIS — E785 Hyperlipidemia, unspecified: Secondary | ICD-10-CM

## 2015-04-29 LAB — POCT GLYCOSYLATED HEMOGLOBIN (HGB A1C): Hemoglobin A1C: 5.9

## 2015-04-29 MED ORDER — SITAGLIPTIN PHOSPHATE 100 MG PO TABS: 100.0000 mg | ORAL_TABLET | Freq: Every day | ORAL | Status: DC

## 2015-04-29 MED ORDER — FLUTICASONE FUROATE-VILANTEROL 100-25 MCG/INH IN AEPB
1.0000 | INHALATION_SPRAY | Freq: Every day | RESPIRATORY_TRACT | Status: DC
Start: 2015-04-29 — End: 2016-05-19

## 2015-04-29 NOTE — Addendum Note (Signed)
Addended by: Gwenith DailyHUDY, KRISTEN N on: 04/29/2015 03:59 PM   Modules accepted: Orders

## 2015-04-29 NOTE — Addendum Note (Signed)
Addended by: Gwenith DailyHUDY, KRISTEN N on: 04/29/2015 04:46 PM   Modules accepted: Orders

## 2015-04-29 NOTE — Progress Notes (Addendum)
   Subjective:    Patient ID: Ray Carlson, male    DOB: 1946-05-22, 69 y.o.   MRN: [id number]  HPI  69 year old gentleman here to follow-up diabetes, COPD, and hyperlipidemia. He really is doing well. He denies any chest pain, dependent edema, burning or tingling in his feet. He has had no recent falls and uses I cane or walker depending on where he's going on a given day. He does pretty well with watching his diet and when he checks his sugars at home have ranged between 80 and 130.    Review of Systems  Constitutional: Negative.   HENT: Negative.   Respiratory: Positive for cough and shortness of breath.   Cardiovascular: Negative.   Gastrointestinal: Negative.   Genitourinary: Negative.   Musculoskeletal: Positive for gait problem.  Neurological: Negative.   Psychiatric/Behavioral: Negative.        Patient Active Problem List   Diagnosis Date Noted  . Vitamin B 12 deficiency 04/04/2014  . Hyponatremia 04/04/2014  . Gait disturbance 04/03/2014  . Hyperlipidemia   . Hypertension   . Diabetes mellitus without complication   . Tobacco user   . Vitamin D deficiency    Outpatient Encounter Prescriptions as of 04/29/2015  Medication Sig  . amLODipine (NORVASC) 5 MG tablet Take 1 tablet (5 mg total) by mouth daily.  . budesonide-formoterol (SYMBICORT) 160-4.5 MCG/ACT inhaler Inhale 2 puffs into the lungs 2 (two) times daily.  . Cholecalciferol (VITAMIN D) 2000 UNITS CAPS Take 1 capsule (2,000 Units total) by mouth daily.  . Cyanocobalamin (VITAMIN B 12 PO) Take 1,000 mg by mouth daily.  Marland Kitchen. glucose blood test strip Use as instructed  . JANUVIA 100 MG tablet TAKE 1 TABLET ONCE DAILY.  Marland Kitchen. lisinopril-hydrochlorothiazide (PRINZIDE,ZESTORETIC) 20-12.5 MG per tablet TAKE 1 TABLET ONCE DAILY.  . metFORMIN (GLUCOPHAGE) 500 MG tablet TAKE ONE TABLET TWICE A DAY.  . metoprolol (LOPRESSOR) 50 MG tablet Take 1 tablet (50 mg total) by mouth 2 (two) times daily.  . pravastatin (PRAVACHOL) 40 MG  tablet Take 1 tablet (40 mg total) by mouth at bedtime.  Marland Kitchen. glipiZIDE (GLUCOTROL) 5 MG tablet TAKE ONE TABLET TWICE A DAY.   No facility-administered encounter medications on file as of 04/29/2015.    Objective:   Physical Exam  Constitutional: He is oriented to person, place, and time. He appears well-developed and well-nourished.  Cardiovascular: Normal rate and regular rhythm.   Pulmonary/Chest: Effort normal and breath sounds normal.  Neurological: He is alert and oriented to person, place, and time.  Psychiatric: He has a normal mood and affect. His behavior is normal.          Assessment & Plan:  1. Diabetes mellitus without complication A1c today is 5.9 indicating excellent control and we should continue on same regimen which is Glucotrol, Januvia, and metformin. - POCT glycosylated hemoglobin (Hb A1C)  2. Hyperlipidemia He continues on pravastatin. Need to check lipids at next visit in August  3. Essential hypertension Blood pressure today is 141/80 medications include lisinopril and hydrochlorothiazide and amlodipine and metoprolol. Pressure control was adequate and no changes are recommended  Frederica KusterStephen M Miller MD

## 2015-04-30 ENCOUNTER — Telehealth: Payer: Self-pay | Admitting: *Deleted

## 2015-04-30 NOTE — Telephone Encounter (Signed)
Pt notified of results Verbalizes understanding 

## 2015-04-30 NOTE — Telephone Encounter (Signed)
-----   Message from Frederica KusterStephen M Miller, MD sent at 04/30/2015  7:57 AM EDT ----- Hemoglobin A1c is great. Would not recommend any change to current treatment

## 2015-05-04 ENCOUNTER — Other Ambulatory Visit: Payer: Self-pay

## 2015-05-04 ENCOUNTER — Other Ambulatory Visit: Payer: Self-pay | Admitting: *Deleted

## 2015-05-04 ENCOUNTER — Other Ambulatory Visit: Payer: Self-pay | Admitting: Family Medicine

## 2015-05-04 DIAGNOSIS — R0602 Shortness of breath: Secondary | ICD-10-CM

## 2015-05-04 MED ORDER — BUDESONIDE-FORMOTEROL FUMARATE 160-4.5 MCG/ACT IN AERO: 2.0000 | INHALATION_SPRAY | Freq: Two times a day (BID) | RESPIRATORY_TRACT | Status: DC

## 2015-05-04 MED ORDER — METOPROLOL TARTRATE 50 MG PO TABS: 50.0000 mg | ORAL_TABLET | Freq: Two times a day (BID) | ORAL | Status: DC

## 2015-05-04 MED ORDER — AMLODIPINE BESYLATE 5 MG PO TABS: 5.0000 mg | ORAL_TABLET | Freq: Every day | ORAL | Status: DC

## 2015-08-19 ENCOUNTER — Other Ambulatory Visit: Payer: Self-pay | Admitting: Family Medicine

## 2015-09-01 ENCOUNTER — Ambulatory Visit (INDEPENDENT_AMBULATORY_CARE_PROVIDER_SITE_OTHER): Payer: Medicare Other | Admitting: Family Medicine

## 2015-09-01 ENCOUNTER — Encounter: Payer: Self-pay | Admitting: Family Medicine

## 2015-09-01 VITALS — BP 132/79 | HR 76 | Temp 97.0°F | Ht 72.0 in | Wt 181.0 lb

## 2015-09-01 DIAGNOSIS — E119 Type 2 diabetes mellitus without complications: Secondary | ICD-10-CM

## 2015-09-01 DIAGNOSIS — Z23 Encounter for immunization: Secondary | ICD-10-CM

## 2015-09-01 DIAGNOSIS — I10 Essential (primary) hypertension: Secondary | ICD-10-CM

## 2015-09-01 DIAGNOSIS — E785 Hyperlipidemia, unspecified: Secondary | ICD-10-CM | POA: Diagnosis not present

## 2015-09-01 LAB — POCT GLYCOSYLATED HEMOGLOBIN (HGB A1C): Hemoglobin A1C: 6.1

## 2015-09-01 MED ORDER — SITAGLIPTIN PHOSPHATE 100 MG PO TABS: 100.0000 mg | ORAL_TABLET | Freq: Every day | ORAL | Status: DC

## 2015-09-01 MED ORDER — AMLODIPINE BESYLATE 5 MG PO TABS: 5.0000 mg | ORAL_TABLET | Freq: Every day | ORAL | Status: DC

## 2015-09-01 MED ORDER — METFORMIN HCL 500 MG PO TABS: ORAL_TABLET | ORAL | Status: DC

## 2015-09-01 MED ORDER — LISINOPRIL-HYDROCHLOROTHIAZIDE 20-12.5 MG PO TABS: 1.0000 | ORAL_TABLET | Freq: Every day | ORAL | Status: DC

## 2015-09-01 MED ORDER — GLIPIZIDE 5 MG PO TABS: ORAL_TABLET | ORAL | Status: DC

## 2015-09-01 MED ORDER — PRAVASTATIN SODIUM 40 MG PO TABS: 40.0000 mg | ORAL_TABLET | Freq: Every day | ORAL | Status: DC

## 2015-09-01 MED ORDER — METOPROLOL TARTRATE 50 MG PO TABS: 50.0000 mg | ORAL_TABLET | Freq: Two times a day (BID) | ORAL | Status: DC

## 2015-09-01 NOTE — Patient Instructions (Addendum)
You may receive a survey either by mail or email. Please take time to complete this as it helps [place] to serve you better.  Schedule eye exam   Health Maintenance A healthy lifestyle and preventative care can promote health and wellness.  Maintain regular health, dental, and eye exams.  Eat a healthy diet. Foods like vegetables, fruits, whole grains, low-fat dairy products, and lean protein foods contain the nutrients you need and are low in calories. Decrease your intake of foods high in solid fats, added sugars, and salt. Get information about a proper diet from your health care Chantalle Defilippo, if necessary.  Regular physical exercise is one of the most important things you can do for your health. Most adults should get at least 150 minutes of moderate-intensity exercise (any activity that increases your heart rate and causes you to sweat) each week. In addition, most adults need muscle-strengthening exercises on 2 or more days a week.   Maintain a healthy weight. The body mass index (BMI) is a screening tool to identify possible weight problems. It provides an estimate of body fat based on height and weight. Your health care Cavin Longman can find your BMI and can help you achieve or maintain a healthy weight. For males 20 years and older:  A BMI below 18.5 is considered underweight.  A BMI of 18.5 to 24.9 is normal.  A BMI of 25 to 29.9 is considered overweight.  A BMI of 30 and above is considered obese.  Maintain normal blood lipids and cholesterol by exercising and minimizing your intake of saturated fat. Eat a balanced diet with plenty of fruits and vegetables. Blood tests for lipids and cholesterol should begin at age 50 and be repeated every 5 years. If your lipid or cholesterol levels are high, you are over age 69, or you are at high risk for heart disease, you may need your cholesterol levels checked more frequently.Ongoing high lipid and cholesterol levels should be treated with medicines if  diet and exercise are not working.  If you smoke, find out from your health care Undray Allman how to quit. If you do not use tobacco, do not start.  Lung cancer screening is recommended for adults aged 55-80 years who are at high risk for developing lung cancer because of a history of smoking. A yearly low-dose CT scan of the lungs is recommended for people who have at least a 30-pack-year history of smoking and are current smokers or have quit within the past 15 years. A pack year of smoking is smoking an average of 1 pack of cigarettes a day for 1 year (for example, a 30-pack-year history of smoking could mean smoking 1 pack a day for 30 years or 2 packs a day for 15 years). Yearly screening should continue until the smoker has stopped smoking for at least 15 years. Yearly screening should be stopped for people who develop a health problem that would prevent them from having lung cancer treatment.  If you choose to drink alcohol, do not have more than 2 drinks per day. One drink is considered to be 12 oz (360 mL) of beer, 5 oz (150 mL) of wine, or 1.5 oz (45 mL) of liquor.  Avoid the use of street drugs. Do not share needles with anyone. Ask for help if you need support or instructions about stopping the use of drugs.  High blood pressure causes heart disease and increases the risk of stroke. Blood pressure should be checked at least every 1-2 years.  Ongoing high blood pressure should be treated with medicines if weight loss and exercise are not effective.  If you are 48-35 years old, ask your health care Chellsie Gomer if you should take aspirin to prevent heart disease.  Diabetes screening involves taking a blood sample to check your fasting blood sugar level. This should be done once every 3 years after age 46 if you are at a normal weight and without risk factors for diabetes. Testing should be considered at a younger age or be carried out more frequently if you are overweight and have at least 1 risk  factor for diabetes.  Colorectal cancer can be detected and often prevented. Most routine colorectal cancer screening begins at the age of 59 and continues through age 59. However, your health care Kassidy Dockendorf may recommend screening at an earlier age if you have risk factors for colon cancer. On a yearly basis, your health care Kowen Kluth may provide home test kits to check for hidden blood in the stool. A small camera at the end of a tube may be used to directly examine the colon (sigmoidoscopy or colonoscopy) to detect the earliest forms of colorectal cancer. Talk to your health care Pearl Bents about this at age 29 when routine screening begins. A direct exam of the colon should be repeated every 5-10 years through age 60, unless early forms of precancerous polyps or small growths are found.  People who are at an increased risk for hepatitis B should be screened for this virus. You are considered at high risk for hepatitis B if:  You were born in a country where hepatitis B occurs often. Talk with your health care Buffi Ewton about which countries are considered high risk.  Your parents were born in a high-risk country and you have not received a shot to protect against hepatitis B (hepatitis B vaccine).  You have HIV or AIDS.  You use needles to inject street drugs.  You live with, or have sex with, someone who has hepatitis B.  You are a man who has sex with other men (MSM).  You get hemodialysis treatment.  You take certain medicines for conditions like cancer, organ transplantation, and autoimmune conditions.  Hepatitis C blood testing is recommended for all people born from [birth year] through 1966 and any individual with known risk factors for hepatitis C.  Healthy men should no longer receive prostate-specific antigen (PSA) blood tests as part of routine cancer screening. Talk to your health care Chadric Kimberley about prostate cancer screening.  Testicular cancer screening is not recommended for  adolescents or adult males who have no symptoms. Screening includes self-exam, a health care Abhijay Morriss exam, and other screening tests. Consult with your health care Kahleb Mcclane about any symptoms you have or any concerns you have about testicular cancer.  Practice safe sex. Use condoms and avoid high-risk sexual practices to reduce the spread of sexually transmitted infections (STIs).  You should be screened for STIs, including gonorrhea and chlamydia if:  You are sexually active and are younger than 24 years.  You are older than 24 years, and your health care Daissy Yerian tells you that you are at risk for this type of infection.  Your sexual activity has changed since you were last screened, and you are at an increased risk for chlamydia or gonorrhea. Ask your health care Brighton Delio if you are at risk.  If you are at risk of being infected with HIV, it is recommended that you take a prescription medicine daily to prevent HIV infection. This  is called pre-exposure prophylaxis (PrEP). You are considered at risk if:  You are a man who has sex with other men (MSM).  You are a heterosexual man who is sexually active with multiple partners.  You take drugs by injection.  You are sexually active with a partner who has HIV.  Talk with your health care Leidy Massar about whether you are at high risk of being infected with HIV. If you choose to begin PrEP, you should first be tested for HIV. You should then be tested every 3 months for as long as you are taking PrEP.  Use sunscreen. Apply sunscreen liberally and repeatedly throughout the day. You should seek shade when your shadow is shorter than you. Protect yourself by wearing long sleeves, pants, a wide-brimmed hat, and sunglasses year round whenever you are outdoors.  Tell your health care Delisia Mcquiston of new moles or changes in moles, especially if there is a change in shape or color. Also, tell your health care Hallelujah Wysong if a mole is larger than the size of a  pencil eraser.  A one-time screening for abdominal aortic aneurysm (AAA) and surgical repair of large AAAs by ultrasound is recommended for men aged 65-75 years who are current or former smokers.  Stay current with your vaccines (immunizations). Document Released: 05/26/2008 Document Revised: 12/03/2013 Document Reviewed: 04/25/2011 [facility] Patient Information 2015 [place], [place]. This information is not intended to replace advice given to you by your health care Sydne Krahl. Make sure you discuss any questions you have with your health care Simuel Stebner.  Pneumococcal Vaccine, Polyvalent suspension for injection What is this medicine? PNEUMOCOCCAL VACCINE, POLYVALENT (NEU mo KOK al vak SEEN, pol ee VEY luhnt) is a vaccine to prevent pneumococcus bacteria infection. These bacteria are a major cause of ear infections, 'Strep throat' infections, and serious pneumonia, meningitis, or blood infections worldwide. These vaccines help the body to produce antibodies (protective substances) that help your body defend against these bacteria. This vaccine is recommended for infants and young children. This vaccine will not treat an infection. This medicine may be used for other purposes; ask your health care Lupita Rosales or pharmacist if you have questions. COMMON BRAND NAME(S): Prevnar 13 What should I tell my health care Sydelle Sherfield before I take this medicine? They need to know if you have any of these conditions: -bleeding problems -fever -immune system problems -low platelet count in the blood -seizures -an unusual or allergic reaction to pneumococcal vaccine, diphtheria toxoid, other vaccines, latex, other medicines, foods, dyes, or preservatives -pregnant or trying to get pregnant -breast-feeding How should I use this medicine? This vaccine is for injection into a muscle. It is given by a health care professional. A copy of Vaccine Information Statements will be given before each vaccination. Read this  sheet carefully each time. The sheet may change frequently. Talk to your pediatrician regarding the use of this medicine in children. While this drug may be prescribed for children as young as 60 weeks old for selected conditions, precautions do apply. Overdosage: If you think you have taken too much of this medicine contact a poison control center or emergency room at once. NOTE: This medicine is only for you. Do not share this medicine with others. What if I miss a dose? It is important not to miss your dose. Call your doctor or health care professional if you are unable to keep an appointment. What may interact with this medicine? -medicines for cancer chemotherapy -medicines that suppress your immune function -medicines that treat or  prevent blood clots like warfarin, enoxaparin, and dalteparin -steroid medicines like prednisone or cortisone This list may not describe all possible interactions. Give your health care Kora Groom a list of all the medicines, herbs, non-prescription drugs, or dietary supplements you use. Also tell them if you smoke, drink alcohol, or use illegal drugs. Some items may interact with your medicine. What should I watch for while using this medicine? Mild fever and pain should go away in 3 days or less. Report any unusual symptoms to your doctor or health care professional. What side effects may I notice from receiving this medicine? Side effects that you should report to your doctor or health care professional as soon as possible: -allergic reactions like skin rash, itching or hives, swelling of the face, lips, or tongue -breathing problems -confused -fever over 102 degrees F -pain, tingling, numbness in the hands or feet -seizures -unusual bleeding or bruising -unusual muscle weakness Side effects that usually do not require medical attention (report to your doctor or health care professional if they continue or are bothersome): -aches and pains -diarrhea -fever  of 102 degrees F or less -headache -irritable -loss of appetite -pain, tender at site where injected -trouble sleeping This list may not describe all possible side effects. Call your doctor for medical advice about side effects. You may report side effects to FDA at 1-800-FDA-1088. Where should I keep my medicine? This does not apply. This vaccine is given in a clinic, pharmacy, doctor's office, or other health care setting and will not be stored at home. NOTE: This sheet is a summary. It may not cover all possible information. If you have questions about this medicine, talk to your doctor, pharmacist, or health care Sunday Klos.  2015, Elsevier/Gold Standard. (2009-02-10 10:17:22)

## 2015-09-01 NOTE — Progress Notes (Signed)
Subjective:    Patient ID: Ray Carlson, male    DOB: 01/17/1946, 69 y.o.   MRN: [id number]  HPI  69 year old here to follow-up diabetes, hyperlipidemia, hypertension, and gait disturbance. Regarding the gait disturbance, we assume is related to B12. He had seen a neurologist who did not feel problem was related to diabetes. He does take B12 supplement. Sugars in the past have been well controlled. Last A1c 4 months ago was 5.9. Blood pressures have likewise been controlled on combination ACE inhibitor, beta blocker. His diabetic regimen consists of glipizide, metformin, and sitagliptin. His sugars at home are all less than 120 by history. Lipids were last tested 1 year ago and were at goal at that time with LDL less than 100.    Review of Systems  Constitutional: Negative.   Respiratory: Negative.   Cardiovascular: Negative.   Psychiatric/Behavioral: Negative.     Patient Active Problem List   Diagnosis Date Noted  . Vitamin B 12 deficiency 04/04/2014  . Hyponatremia 04/04/2014  . Gait disturbance 04/03/2014  . Hyperlipidemia   . Hypertension   . Diabetes mellitus without complication   . Tobacco user   . Vitamin D deficiency    Outpatient Encounter Prescriptions as of 09/01/2015  Medication Sig  . amLODipine (NORVASC) 5 MG tablet Take 1 tablet (5 mg total) by mouth daily.  . Cholecalciferol (VITAMIN D) 2000 UNITS CAPS Take 1 capsule (2,000 Units total) by mouth daily.  . Cyanocobalamin (VITAMIN B 12 PO) Take 1,000 mg by mouth daily.  . Fluticasone Furoate-Vilanterol (BREO ELLIPTA) 100-25 MCG/INH AEPB Inhale 1 puff into the lungs daily.  Marland Kitchen glipiZIDE (GLUCOTROL) 5 MG tablet TAKE ONE TABLET TWICE A DAY.  Marland Kitchen glucose blood test strip Use as instructed  . lisinopril-hydrochlorothiazide (PRINZIDE,ZESTORETIC) 20-12.5 MG per tablet Take 1 tablet by mouth daily.  . metFORMIN (GLUCOPHAGE) 500 MG tablet TAKE ONE TABLET TWICE A DAY.  . metoprolol (LOPRESSOR) 50 MG tablet Take 1 tablet (50  mg total) by mouth 2 (two) times daily.  . pravastatin (PRAVACHOL) 40 MG tablet Take 1 tablet (40 mg total) by mouth at bedtime.  . sitaGLIPtin (JANUVIA) 100 MG tablet Take 1 tablet (100 mg total) by mouth daily.  . [DISCONTINUED] amLODipine (NORVASC) 5 MG tablet Take 1 tablet (5 mg total) by mouth daily.  . [DISCONTINUED] budesonide-formoterol (SYMBICORT) 160-4.5 MCG/ACT inhaler Inhale 2 puffs into the lungs 2 (two) times daily.  . [DISCONTINUED] glipiZIDE (GLUCOTROL) 5 MG tablet TAKE ONE TABLET TWICE A DAY.  . [DISCONTINUED] JANUVIA 100 MG tablet TAKE 1 TABLET ONCE DAILY.  . [DISCONTINUED] lisinopril-hydrochlorothiazide (PRINZIDE,ZESTORETIC) 20-12.5 MG per tablet TAKE 1 TABLET ONCE DAILY.  . [DISCONTINUED] metFORMIN (GLUCOPHAGE) 500 MG tablet TAKE ONE TABLET TWICE A DAY.  . [DISCONTINUED] metoprolol (LOPRESSOR) 50 MG tablet Take 1 tablet (50 mg total) by mouth 2 (two) times daily.  . [DISCONTINUED] pravastatin (PRAVACHOL) 40 MG tablet Take 1 tablet (40 mg total) by mouth at bedtime.  . [DISCONTINUED] sitaGLIPtin (JANUVIA) 100 MG tablet Take 1 tablet (100 mg total) by mouth daily.   No facility-administered encounter medications on file as of 09/01/2015.    Objective:   Physical Exam  Constitutional: He is oriented to person, place, and time. He appears well-developed and well-nourished.  He is a walker as ambulatory aid  Cardiovascular: Normal rate, regular rhythm and normal heart sounds.   Pulmonary/Chest: Effort normal and breath sounds normal.  Neurological: He is alert and oriented to person, place, and time.  Psychiatric:  He has a normal mood and affect. His behavior is normal.          Assessment & Plan:  1. Hyperlipidemia Pins were at goal when last checked need to update today - CMP14+EGFR - Lipid panel - pravastatin (PRAVACHOL) 40 MG tablet; Take 1 tablet (40 mg total) by mouth at bedtime.  Dispense: 90 tablet; Refill: 1  2. Essential hypertension Asher well controlled  on current regimen. Continue same  3. Diabetes mellitus without complication These also well controlled on current regimen. A1c was 5.94 months ago - POCT glycosylated hemoglobin (Hb A1C) - POCT UA - Microalbumin - metFORMIN (GLUCOPHAGE) 500 MG tablet; TAKE ONE TABLET TWICE A DAY.  Dispense: 180 tablet; Refill: 1   Wardell Honour MD

## 2015-09-01 NOTE — Addendum Note (Signed)
Addended by: Orma Render F on: 09/01/2015 11:12 AM   Modules accepted: Orders

## 2015-09-02 ENCOUNTER — Other Ambulatory Visit: Payer: Medicare Other

## 2015-09-02 LAB — CMP14+EGFR
ALT: 21 IU/L (ref 0–44)
AST: 17 IU/L (ref 0–40)
Albumin/Globulin Ratio: 1.5 (ref 1.1–2.5)
Albumin: 4.3 g/dL (ref 3.5–4.8)
Alkaline Phosphatase: 57 IU/L (ref 39–117)
BUN/Creatinine Ratio: 17 (ref 10–22)
BUN: 14 mg/dL (ref 8–27)
Bilirubin Total: 0.5 mg/dL (ref 0.0–1.2)
CO2: 24 mmol/L (ref 18–29)
Calcium: 9.5 mg/dL (ref 8.6–10.2)
Chloride: 86 mmol/L — ABNORMAL LOW (ref 97–108)
Creatinine, Ser: 0.83 mg/dL (ref 0.76–1.27)
GFR calc Af Amer: 103 mL/min/{1.73_m2} (ref >59)
GFR calc non Af Amer: 89 mL/min/{1.73_m2} (ref >59)
Globulin, Total: 2.8 g/dL (ref 1.5–4.5)
Glucose: 113 mg/dL — ABNORMAL HIGH (ref 65–99)
Potassium: 5 mmol/L (ref 3.5–5.2)
Sodium: 130 mmol/L — ABNORMAL LOW (ref 134–144)
Total Protein: 7.1 g/dL (ref 6.0–8.5)

## 2015-09-02 LAB — LIPID PANEL
Chol/HDL Ratio: 4.7 ratio units (ref 0.0–5.0)
Cholesterol, Total: 169 mg/dL (ref 100–199)
HDL: 36 mg/dL — ABNORMAL LOW (ref >39)
LDL Calculated: 107 mg/dL — ABNORMAL HIGH (ref 0–99)
Triglycerides: 130 mg/dL (ref 0–149)
VLDL Cholesterol Cal: 26 mg/dL (ref 5–40)

## 2015-09-02 LAB — POCT UA - MICROALBUMIN: Microalbumin Ur, POC: NEGATIVE mg/L

## 2015-09-02 NOTE — Progress Notes (Signed)
Fast busy #.

## 2015-09-02 NOTE — Addendum Note (Signed)
Addended by: Tommas Olp on: 09/02/2015 09:27 AM   Modules accepted: Orders

## 2015-09-21 ENCOUNTER — Other Ambulatory Visit: Payer: Self-pay | Admitting: Family Medicine

## 2015-10-30 ENCOUNTER — Other Ambulatory Visit: Payer: Self-pay | Admitting: Family Medicine

## 2015-11-18 ENCOUNTER — Other Ambulatory Visit: Payer: Self-pay | Admitting: Family Medicine

## 2015-12-03 IMAGING — CR DG CHEST 2V
2 series · 2 of 2 positions shown · non-contrast
Comparison: None.

CLINICAL DATA: Shortness of breath.

EXAM:
CHEST  2 VIEW

[view not recorded (1 of 2)]
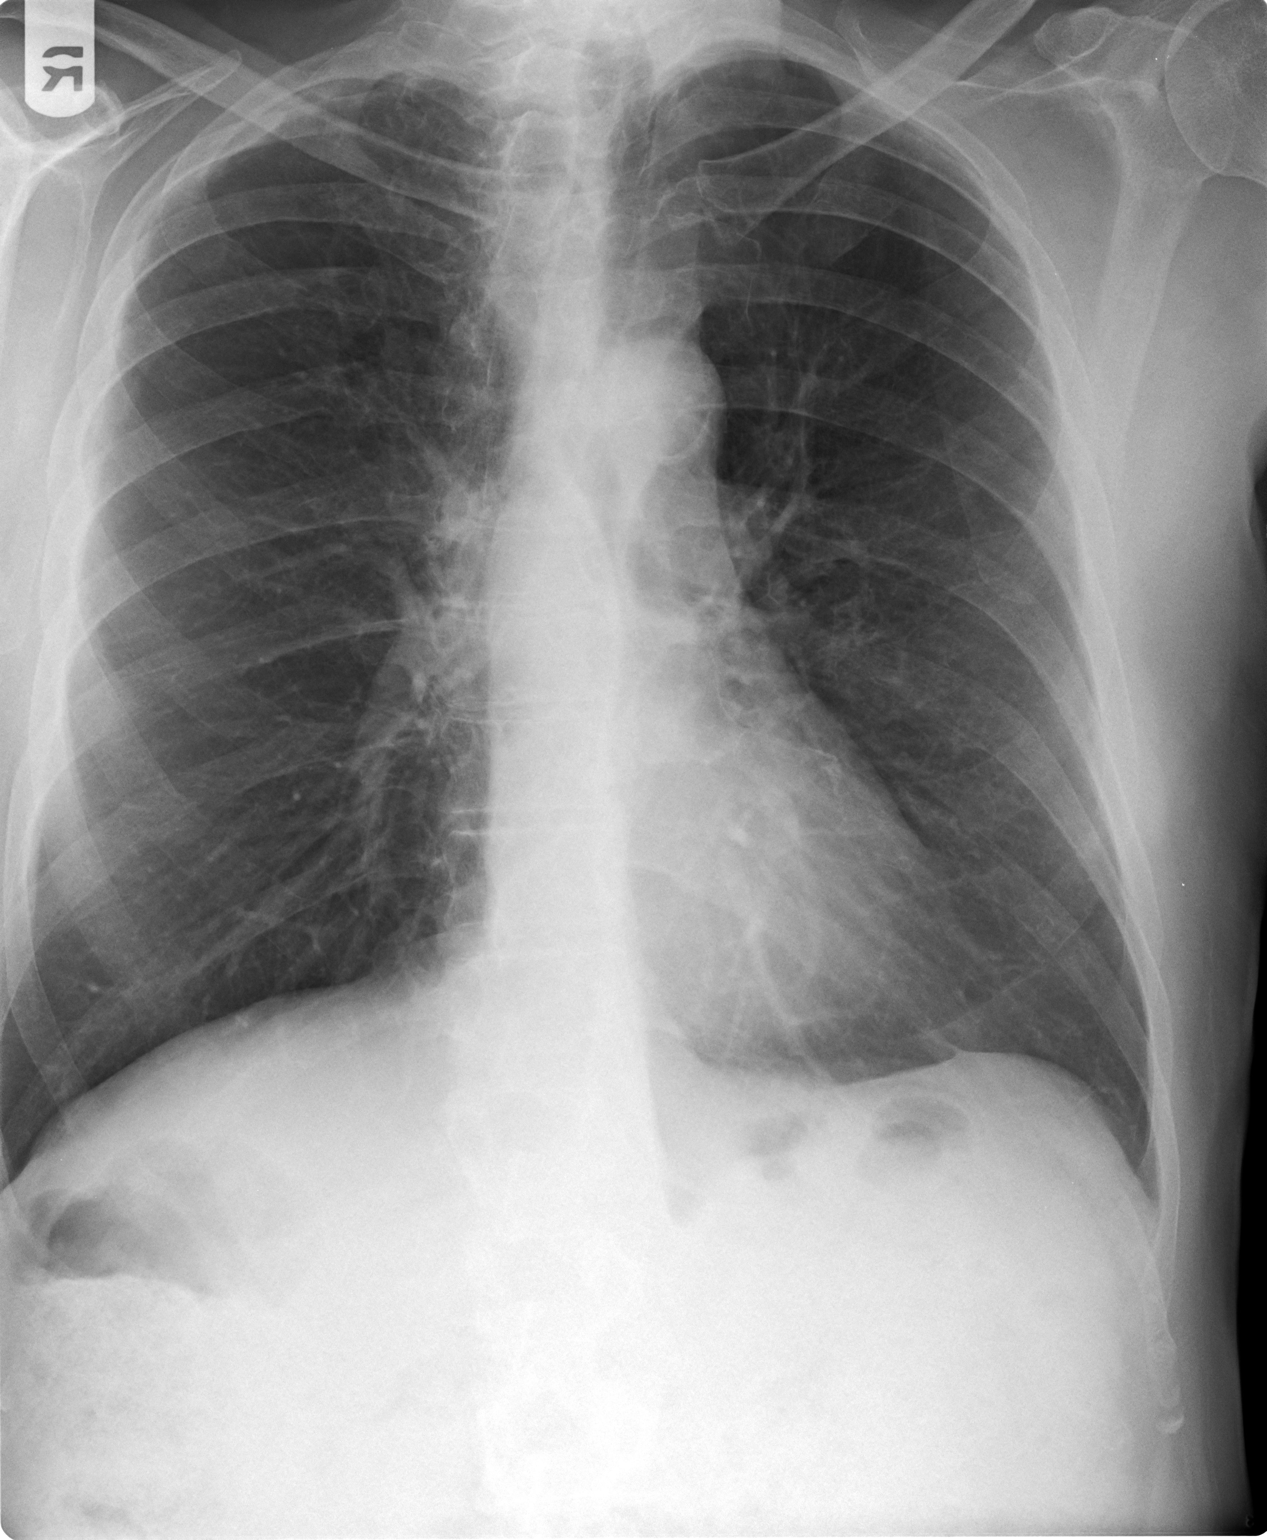

[view not recorded (2 of 2)]
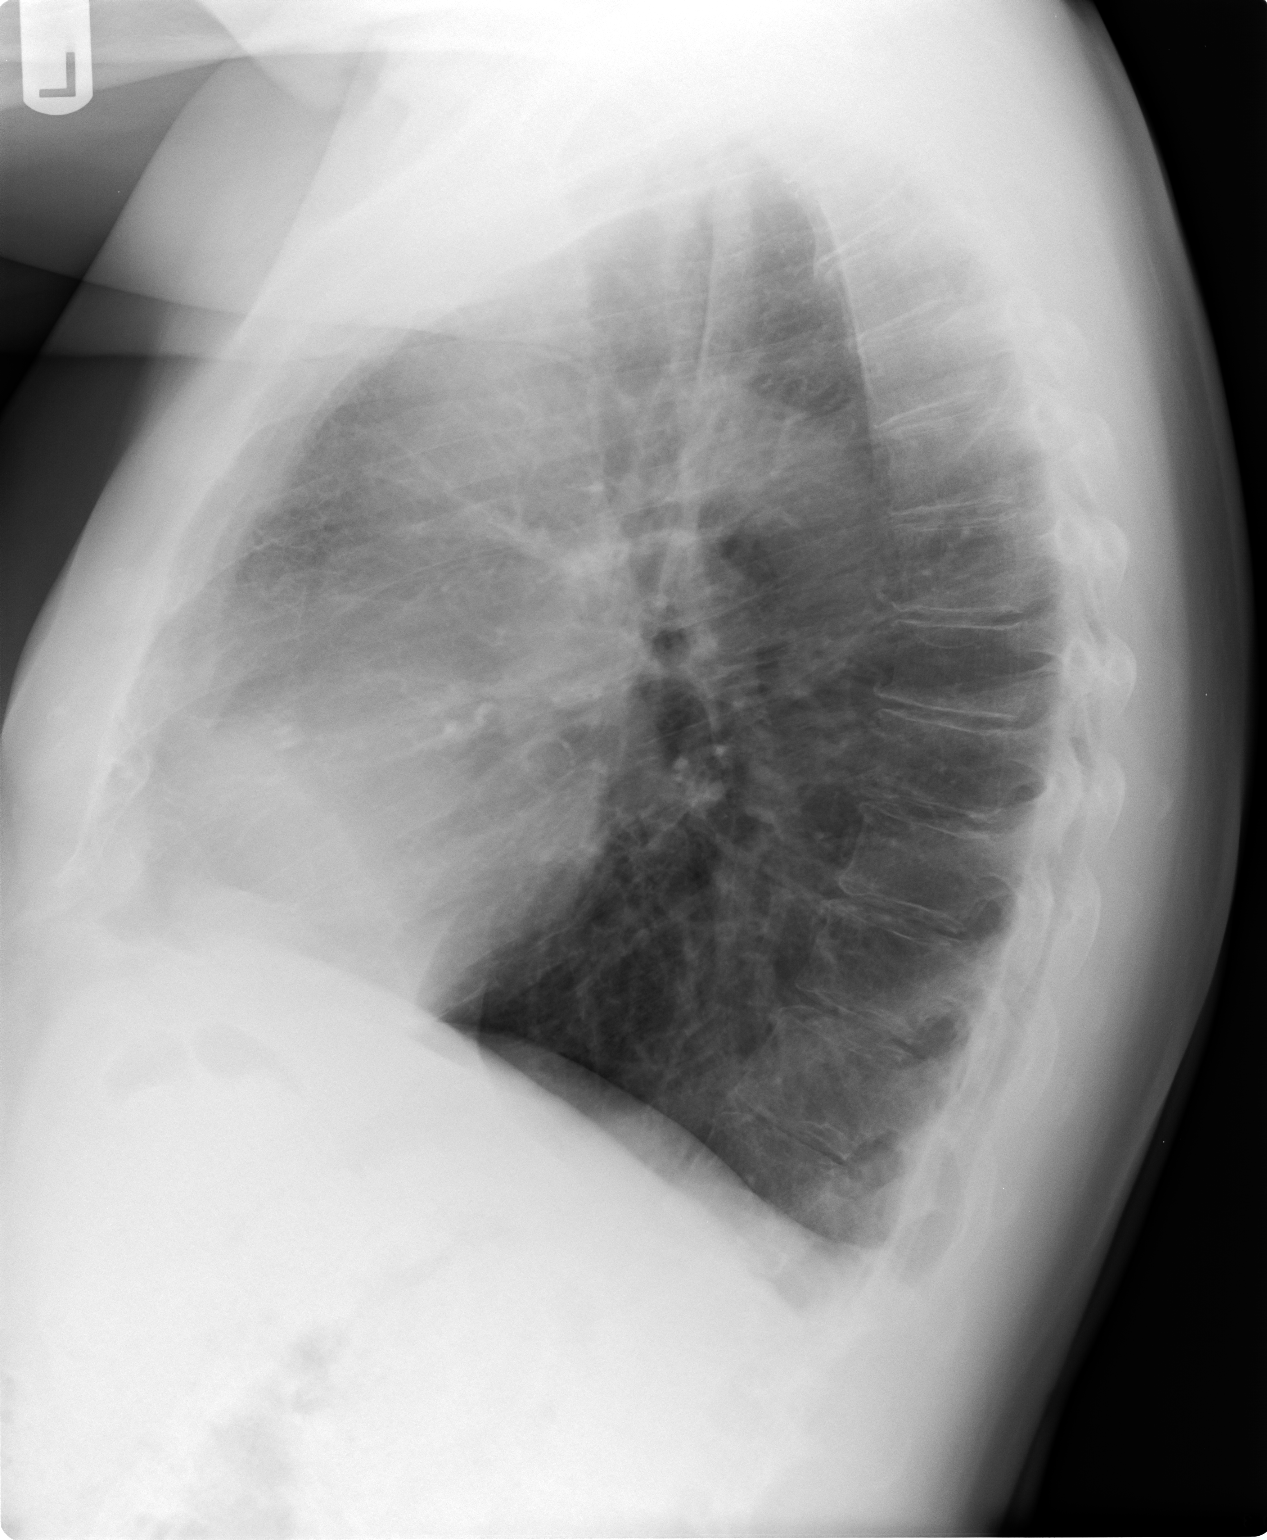

[2 of 2 positions shown; findings below may reference images not displayed]

FINDINGS: Lungs are adequately inflated without consolidation or effusion.
Cardiomediastinal silhouette is within normal. There is minimal
calcified plaque involving the thoracic aorta. There is minimal
spondylosis of the spine.
IMPRESSION: No active cardiopulmonary disease.

## 2016-01-04 ENCOUNTER — Ambulatory Visit: Payer: Medicare Other | Admitting: Family Medicine

## 2016-01-05 ENCOUNTER — Encounter: Payer: Self-pay | Admitting: Family Medicine

## 2016-01-15 ENCOUNTER — Ambulatory Visit (INDEPENDENT_AMBULATORY_CARE_PROVIDER_SITE_OTHER): Payer: Medicare Other | Admitting: Family Medicine

## 2016-01-15 ENCOUNTER — Encounter: Payer: Self-pay | Admitting: Family Medicine

## 2016-01-15 VITALS — BP 122/72 | HR 123 | Temp 97.5°F | Ht 72.0 in | Wt 181.6 lb

## 2016-01-15 DIAGNOSIS — E119 Type 2 diabetes mellitus without complications: Secondary | ICD-10-CM | POA: Diagnosis not present

## 2016-01-15 DIAGNOSIS — I10 Essential (primary) hypertension: Secondary | ICD-10-CM | POA: Diagnosis not present

## 2016-01-15 DIAGNOSIS — E785 Hyperlipidemia, unspecified: Secondary | ICD-10-CM

## 2016-01-15 DIAGNOSIS — R269 Unspecified abnormalities of gait and mobility: Secondary | ICD-10-CM | POA: Diagnosis not present

## 2016-01-15 LAB — POCT GLYCOSYLATED HEMOGLOBIN (HGB A1C): Hemoglobin A1C: 6

## 2016-01-15 MED ORDER — SITAGLIPTIN PHOSPHATE 100 MG PO TABS: 100.0000 mg | ORAL_TABLET | Freq: Every day | ORAL | Status: DC

## 2016-01-15 MED ORDER — LISINOPRIL-HYDROCHLOROTHIAZIDE 20-12.5 MG PO TABS: 1.0000 | ORAL_TABLET | Freq: Every day | ORAL | Status: DC

## 2016-01-15 MED ORDER — ATORVASTATIN CALCIUM 20 MG PO TABS: 20.0000 mg | ORAL_TABLET | Freq: Every day | ORAL | Status: DC

## 2016-01-15 MED ORDER — METFORMIN HCL 500 MG PO TABS: ORAL_TABLET | ORAL | Status: DC

## 2016-01-15 MED ORDER — METOPROLOL TARTRATE 50 MG PO TABS: 50.0000 mg | ORAL_TABLET | Freq: Two times a day (BID) | ORAL | Status: DC

## 2016-01-15 MED ORDER — GLIPIZIDE 5 MG PO TABS: ORAL_TABLET | ORAL | Status: DC

## 2016-01-15 MED ORDER — AMLODIPINE BESYLATE 5 MG PO TABS: 5.0000 mg | ORAL_TABLET | Freq: Every day | ORAL | Status: DC

## 2016-01-15 NOTE — Progress Notes (Signed)
   Subjective:    Patient ID: Ray Carlson, male    DOB: 08-25-1946, 70 y.o.   MRN: [id number]  HPI 70 year old gentleman who is here to follow-up diabetes. He also has hypertension and hyperlipidemia. Labs were checked 4 months ago and were at goal. Last A1c at that time was 6.1. Diabetes is managed with a regimen of metformin and glipizide and  sitagliptin He uses a walker for ambulation. He was assumed his gait disturbance is related to B12 deficiency. There've been no falls recently and he is accepting of using all per. He has no other specific complaints. Weight is stable and blood pressure is well controlled with lisinopril amlodipine and metoprolol.  Patient Active Problem List   Diagnosis Date Noted  . Vitamin B 12 deficiency 04/04/2014  . Hyponatremia 04/04/2014  . Gait disturbance 04/03/2014  . Hyperlipidemia   . Hypertension   . Diabetes mellitus without complication (HCC)   . Tobacco user   . Vitamin D deficiency    Outpatient Encounter Prescriptions as of 01/15/2016  Medication Sig  . amLODipine (NORVASC) 5 MG tablet Take 1 tablet (5 mg total) by mouth daily.  . Cholecalciferol (VITAMIN D) 2000 UNITS CAPS Take 1 capsule (2,000 Units total) by mouth daily.  . Cyanocobalamin (VITAMIN B 12 PO) Take 1,000 mg by mouth daily.  . Fluticasone Furoate-Vilanterol (BREO ELLIPTA) 100-25 MCG/INH AEPB Inhale 1 puff into the lungs daily.  Marland Kitchen glipiZIDE (GLUCOTROL) 5 MG tablet TAKE ONE TABLET TWICE A DAY.  Marland Kitchen glucose blood (ACCU-CHEK AVIVA PLUS) test strip Test once daily  . lisinopril-hydrochlorothiazide (PRINZIDE,ZESTORETIC) 20-12.5 MG tablet TAKE 1 TABLET ONCE DAILY.  . metFORMIN (GLUCOPHAGE) 500 MG tablet TAKE ONE TABLET TWICE A DAY.  . metoprolol (LOPRESSOR) 50 MG tablet Take 1 tablet (50 mg total) by mouth 2 (two) times daily.  . pravastatin (PRAVACHOL) 40 MG tablet Take 1 tablet (40 mg total) by mouth at bedtime.  . sitaGLIPtin (JANUVIA) 100 MG tablet Take 1 tablet (100 mg total) by mouth  daily.   No facility-administered encounter medications on file as of 01/15/2016.      Review of Systems  Constitutional: Negative.   HENT: Negative.   Respiratory: Negative.   Cardiovascular: Negative.   Musculoskeletal: Positive for gait problem.  Neurological: Negative.   Psychiatric/Behavioral: Negative.        Objective:   Physical Exam  Constitutional: He is oriented to person, place, and time. He appears well-developed and well-nourished.  Cardiovascular: Normal rate, regular rhythm and normal heart sounds.   Pulmonary/Chest: Effort normal and breath sounds normal.  Neurological: He is alert and oriented to person, place, and time. Coordination abnormal.  Psychiatric: He has a normal mood and affect. His behavior is normal.          Assessment & Plan:  1. Hyperlipidemia Lipids are at goal but will change from pravastatin to 8 atorvastatin and recheck lipids in 2-3 months. - POCT glycosylated hemoglobin (Hb A1C)   2. Essential hypertension Pressure well controlled on current regimen  3. Diabetes mellitus without complication (HCC) Diabetes is well controlled. A1c is pending but when last checked it was excellent at 6.1  4. Gait disturbance Her been no falls. He is dependent on walker. I observed him walking today and it is difficult. I had asked him to walk on his toes and heels and he is able to do it but it is not easy.  Frederica Kuster MD

## 2016-04-26 DIAGNOSIS — H2513 Age-related nuclear cataract, bilateral: Secondary | ICD-10-CM | POA: Diagnosis not present

## 2016-05-17 ENCOUNTER — Other Ambulatory Visit: Payer: Medicare Other

## 2016-05-17 DIAGNOSIS — I10 Essential (primary) hypertension: Secondary | ICD-10-CM | POA: Diagnosis not present

## 2016-05-17 DIAGNOSIS — E119 Type 2 diabetes mellitus without complications: Secondary | ICD-10-CM

## 2016-05-17 DIAGNOSIS — E785 Hyperlipidemia, unspecified: Secondary | ICD-10-CM | POA: Diagnosis not present

## 2016-05-17 LAB — BAYER DCA HB A1C WAIVED: HB A1C (BAYER DCA - WAIVED): 5.8 % (ref <7.0)

## 2016-05-17 LAB — CMP14+EGFR
ALT: 24 IU/L (ref 0–44)
AST: 20 IU/L (ref 0–40)
Albumin/Globulin Ratio: 1.4 (ref 1.2–2.2)
Albumin: 4.1 g/dL (ref 3.5–4.8)
Alkaline Phosphatase: 58 IU/L (ref 39–117)
BUN/Creatinine Ratio: 17 (ref 10–24)
BUN: 13 mg/dL (ref 8–27)
Bilirubin Total: 0.6 mg/dL (ref 0.0–1.2)
CO2: 24 mmol/L (ref 18–29)
Calcium: 9.2 mg/dL (ref 8.6–10.2)
Chloride: 88 mmol/L — ABNORMAL LOW (ref 96–106)
Creatinine, Ser: 0.77 mg/dL (ref 0.76–1.27)
GFR calc Af Amer: 106 mL/min/{1.73_m2} (ref >59)
GFR calc non Af Amer: 91 mL/min/{1.73_m2} (ref >59)
Globulin, Total: 3 g/dL (ref 1.5–4.5)
Glucose: 111 mg/dL — ABNORMAL HIGH (ref 65–99)
Potassium: 4.4 mmol/L (ref 3.5–5.2)
Sodium: 128 mmol/L — ABNORMAL LOW (ref 134–144)
Total Protein: 7.1 g/dL (ref 6.0–8.5)

## 2016-05-17 LAB — LIPID PANEL
Chol/HDL Ratio: 3.1 ratio units (ref 0.0–5.0)
Cholesterol, Total: 113 mg/dL (ref 100–199)
HDL: 36 mg/dL — ABNORMAL LOW (ref >39)
LDL Calculated: 58 mg/dL (ref 0–99)
Triglycerides: 94 mg/dL (ref 0–149)
VLDL Cholesterol Cal: 19 mg/dL (ref 5–40)

## 2016-05-19 ENCOUNTER — Encounter: Payer: Self-pay | Admitting: Family Medicine

## 2016-05-19 ENCOUNTER — Ambulatory Visit (INDEPENDENT_AMBULATORY_CARE_PROVIDER_SITE_OTHER): Payer: Medicare Other | Admitting: Family Medicine

## 2016-05-19 VITALS — BP 131/81 | HR 80 | Temp 97.9°F | Ht 72.0 in | Wt 182.6 lb

## 2016-05-19 DIAGNOSIS — E785 Hyperlipidemia, unspecified: Secondary | ICD-10-CM | POA: Diagnosis not present

## 2016-05-19 DIAGNOSIS — E119 Type 2 diabetes mellitus without complications: Secondary | ICD-10-CM | POA: Diagnosis not present

## 2016-05-19 DIAGNOSIS — R269 Unspecified abnormalities of gait and mobility: Secondary | ICD-10-CM | POA: Diagnosis not present

## 2016-05-19 DIAGNOSIS — I10 Essential (primary) hypertension: Secondary | ICD-10-CM

## 2016-05-19 MED ORDER — AMLODIPINE BESYLATE 5 MG PO TABS: 5.0000 mg | ORAL_TABLET | Freq: Every day | ORAL | Status: DC

## 2016-05-19 MED ORDER — GLIPIZIDE 5 MG PO TABS: ORAL_TABLET | ORAL | Status: DC

## 2016-05-19 MED ORDER — METOPROLOL TARTRATE 50 MG PO TABS: 50.0000 mg | ORAL_TABLET | Freq: Two times a day (BID) | ORAL | Status: DC

## 2016-05-19 MED ORDER — SITAGLIPTIN PHOSPHATE 100 MG PO TABS: 100.0000 mg | ORAL_TABLET | Freq: Every day | ORAL | Status: DC

## 2016-05-19 MED ORDER — METFORMIN HCL 500 MG PO TABS: ORAL_TABLET | ORAL | Status: DC

## 2016-05-19 MED ORDER — FLUTICASONE FUROATE-VILANTEROL 100-25 MCG/INH IN AEPB: 1.0000 | INHALATION_SPRAY | Freq: Every day | RESPIRATORY_TRACT | Status: DC

## 2016-05-19 MED ORDER — LISINOPRIL-HYDROCHLOROTHIAZIDE 20-12.5 MG PO TABS: 1.0000 | ORAL_TABLET | Freq: Every day | ORAL | Status: DC

## 2016-05-19 NOTE — Progress Notes (Signed)
   Subjective:    Patient ID: Ray Carlson, male    DOB: 04/04/1946, [age] y.o.   MRN: [id number]  HPI 70 year old gentleman with diabetes hyperlipidemia and hypertension. He had blood done earlier this week and we discussed those results today. His hemoglobin A1c is 5.8. Total cholesterol is 113 with LDL cholesterol of 58, and definite improvement from last checked 9 months ago.  Patient Active Problem List   Diagnosis Date Noted  . Vitamin B 12 deficiency 04/04/2014  . Hyponatremia 04/04/2014  . Gait disturbance 04/03/2014  . Hyperlipidemia   . Hypertension   . Diabetes mellitus without complication (HCC)   . Tobacco user   . Vitamin D deficiency    Outpatient Encounter Prescriptions as of 05/19/2016  Medication Sig  . amLODipine (NORVASC) 5 MG tablet Take 1 tablet (5 mg total) by mouth daily.  Marland Kitchen. atorvastatin (LIPITOR) 20 MG tablet Take 1 tablet (20 mg total) by mouth daily.  . Cholecalciferol (VITAMIN D) 2000 UNITS CAPS Take 1 capsule (2,000 Units total) by mouth daily.  . Cyanocobalamin (VITAMIN B 12 PO) Take 1,000 mg by mouth daily.  . Fluticasone Furoate-Vilanterol (BREO ELLIPTA) 100-25 MCG/INH AEPB Inhale 1 puff into the lungs daily.  Marland Kitchen. glipiZIDE (GLUCOTROL) 5 MG tablet TAKE ONE TABLET TWICE A DAY.  Marland Kitchen. glucose blood (ACCU-CHEK AVIVA PLUS) test strip Test once daily  . lisinopril-hydrochlorothiazide (PRINZIDE,ZESTORETIC) 20-12.5 MG tablet Take 1 tablet by mouth daily.  . metFORMIN (GLUCOPHAGE) 500 MG tablet TAKE ONE TABLET TWICE A DAY.  . metoprolol (LOPRESSOR) 50 MG tablet Take 1 tablet (50 mg total) by mouth 2 (two) times daily.  . sitaGLIPtin (JANUVIA) 100 MG tablet Take 1 tablet (100 mg total) by mouth daily.  . [DISCONTINUED] pravastatin (PRAVACHOL) 40 MG tablet Take 1 tablet (40 mg total) by mouth at bedtime.   No facility-administered encounter medications on file as of 05/19/2016.      Review of Systems  Constitutional: Negative.   HENT: Negative.   Respiratory: Negative.     Cardiovascular: Negative.   Gastrointestinal: Negative.   Genitourinary: Negative.   Psychiatric/Behavioral: Negative.        Objective:   Physical Exam  Constitutional: He is oriented to person, place, and time. He appears well-developed and well-nourished.  Cardiovascular: Normal rate and regular rhythm.   Pulmonary/Chest: Effort normal and breath sounds normal.  Abdominal: Soft.  Neurological: He is alert and oriented to person, place, and time.  Psychiatric: He has a normal mood and affect.   BP 131/81 mmHg  Pulse 80  Temp(Src) 97.9 F (36.6 C) (Oral)  Ht 6' (1.829 m)  Wt 182 lb 9.6 oz (82.827 kg)  BMI 24.76 kg/m2        Assessment & Plan:   1. Essential hypertension Blood pressure is well managed on a combination of amlodipine and lisinopril and metoprolol. Continue same  2. Diabetes mellitus without complication (HCC) Noted above A1c is in good shape 5.8 indicating good control of diabetes. He uses metformin and sitagliptin.  3. Gait disturbance Recent falls. Continues to ambulate with aid of a walker.  4. Hyperlipidemia 6 changing over from a low potency statin to lower pons a standard lipids looked very good. He has no side effects. We'll continue with atorvastatin as before  Frederica KusterStephen M Sherrine Salberg MD

## 2016-05-27 ENCOUNTER — Encounter: Payer: Self-pay | Admitting: *Deleted

## 2016-09-05 ENCOUNTER — Other Ambulatory Visit: Payer: Self-pay | Admitting: Family Medicine

## 2016-09-05 DIAGNOSIS — R0602 Shortness of breath: Secondary | ICD-10-CM

## 2016-09-06 ENCOUNTER — Encounter: Payer: Self-pay | Admitting: *Deleted

## 2016-09-19 NOTE — Progress Notes (Signed)
   Subjective:    Patient ID: Ray Carlson, male    DOB: 10-14-1946, 70 y.o.   MRN: [id number]  HPI 70 year old gentleman here to follow-up diabetes, hypertension, hyperlipidemia, and gait disturbance. Regarding the latter, he is resigned to using his walker and has had no falls. Sugars have been well controlled in general[. His last A1c was 5.8] back in June of this year.  Patient Active Problem List   Diagnosis Date Noted  . Vitamin B 12 deficiency 04/04/2014  . Hyponatremia 04/04/2014  . Gait disturbance 04/03/2014  . Hyperlipidemia   . Hypertension   . Diabetes mellitus without complication (HCC)   . Tobacco user   . Vitamin D deficiency    Outpatient Encounter Prescriptions as of 09/20/2016  Medication Sig  . amLODipine (NORVASC) 5 MG tablet Take 1 tablet (5 mg total) by mouth daily.  Marland Kitchen. atorvastatin (LIPITOR) 20 MG tablet TAKE 1 TABLET ONCE DAILY.  Marland Kitchen. Cholecalciferol (VITAMIN D) 2000 UNITS CAPS Take 1 capsule (2,000 Units total) by mouth daily.  . Cyanocobalamin (VITAMIN B 12 PO) Take 1,000 mg by mouth daily.  . fluticasone furoate-vilanterol (BREO ELLIPTA) 100-25 MCG/INH AEPB Inhale 1 puff into the lungs daily.  Marland Kitchen. glipiZIDE (GLUCOTROL) 5 MG tablet TAKE ONE TABLET TWICE A DAY.  Marland Kitchen. glucose blood (ACCU-CHEK AVIVA PLUS) test strip Test once daily  . lisinopril-hydrochlorothiazide (PRINZIDE,ZESTORETIC) 20-12.5 MG tablet Take 1 tablet by mouth daily.  . metFORMIN (GLUCOPHAGE) 500 MG tablet TAKE ONE TABLET TWICE A DAY.  . metoprolol (LOPRESSOR) 50 MG tablet Take 1 tablet (50 mg total) by mouth 2 (two) times daily.  . sitaGLIPtin (JANUVIA) 100 MG tablet Take 1 tablet (100 mg total) by mouth daily.  . [DISCONTINUED] SYMBICORT 160-4.5 MCG/ACT inhaler INHALE 2 PUFFS ORALLY TWICE DAILY.   No facility-administered encounter medications on file as of 09/20/2016.       Review of Systems  Constitutional: Negative.   HENT: Negative.   Respiratory: Negative.   Cardiovascular: Negative.     Psychiatric/Behavioral: Negative.        Objective:   Physical Exam  Constitutional: He is oriented to person, place, and time. He appears well-developed and well-nourished.  Cardiovascular: Normal rate, regular rhythm and normal heart sounds.   Pulmonary/Chest: Effort normal and breath sounds normal.  Neurological: He is alert and oriented to person, place, and time.  Psychiatric: He has a normal mood and affect. His behavior is normal.   BP 133/86   Pulse 74   Temp 97.6 F (36.4 C) (Oral)   Ht 6' (1.829 m)   Wt 187 lb (84.8 kg)   BMI 25.36 kg/m        Assessment & Plan:  1. Diabetes mellitus without complication (HCC) Compliant with medications including glipizide and metformin and Januvia. Expect A1c to be good as before - Bayer DCA Hb A1c Waived  2. Essential hypertension Blood pressures are well controlled. He is on an ACE inhibitor  Frederica KusterStephen M Dashiel Bergquist MD

## 2016-09-20 ENCOUNTER — Ambulatory Visit (INDEPENDENT_AMBULATORY_CARE_PROVIDER_SITE_OTHER): Payer: Medicare Other | Admitting: Family Medicine

## 2016-09-20 ENCOUNTER — Encounter: Payer: Self-pay | Admitting: Family Medicine

## 2016-09-20 VITALS — BP 133/86 | HR 74 | Temp 97.6°F | Ht 72.0 in | Wt 187.0 lb

## 2016-09-20 DIAGNOSIS — E119 Type 2 diabetes mellitus without complications: Secondary | ICD-10-CM

## 2016-09-20 DIAGNOSIS — Z23 Encounter for immunization: Secondary | ICD-10-CM

## 2016-09-20 DIAGNOSIS — I10 Essential (primary) hypertension: Secondary | ICD-10-CM | POA: Diagnosis not present

## 2016-09-20 LAB — BAYER DCA HB A1C WAIVED: HB A1C (BAYER DCA - WAIVED): 6.3 % (ref <7.0)

## 2016-09-22 ENCOUNTER — Ambulatory Visit: Payer: Medicare Other | Admitting: Family Medicine

## 2017-01-16 ENCOUNTER — Other Ambulatory Visit: Payer: Self-pay | Admitting: Family Medicine

## 2017-01-26 ENCOUNTER — Ambulatory Visit (INDEPENDENT_AMBULATORY_CARE_PROVIDER_SITE_OTHER): Payer: Medicare Other | Admitting: Family Medicine

## 2017-01-26 ENCOUNTER — Encounter: Payer: Self-pay | Admitting: Family Medicine

## 2017-01-26 VITALS — BP 138/75 | HR 85 | Temp 97.1°F | Ht 72.0 in | Wt 185.4 lb

## 2017-01-26 DIAGNOSIS — I10 Essential (primary) hypertension: Secondary | ICD-10-CM | POA: Diagnosis not present

## 2017-01-26 DIAGNOSIS — E119 Type 2 diabetes mellitus without complications: Secondary | ICD-10-CM

## 2017-01-26 MED ORDER — AMLODIPINE BESYLATE 5 MG PO TABS: 5.0000 mg | ORAL_TABLET | Freq: Every day | ORAL | 0 refills | Status: DC

## 2017-01-26 MED ORDER — GLIPIZIDE 5 MG PO TABS: 5.0000 mg | ORAL_TABLET | Freq: Two times a day (BID) | ORAL | 0 refills | Status: DC

## 2017-01-26 MED ORDER — ATORVASTATIN CALCIUM 20 MG PO TABS: 20.0000 mg | ORAL_TABLET | Freq: Every day | ORAL | 0 refills | Status: DC

## 2017-01-26 NOTE — Progress Notes (Signed)
   Subjective:    Patient ID: Ray Carlson, male    DOB: 02-Dec-1946, 71 y.o.   MRN: [id number]  HPI 71 year old gentleman with history of hypertension diabetes hyperlipidemia gait disturbance and vitamin B12 deficiency. He has started ambulating with a walker. I think this will serve him well as far as fall prevention. He has no specific complaints today except some decreased hearing in his right ear; he thinks it may need to be irrigated.  Patient Active Problem List   Diagnosis Date Noted  . Vitamin B 12 deficiency 04/04/2014  . Hyponatremia 04/04/2014  . Gait disturbance 04/03/2014  . Hyperlipidemia   . Hypertension   . Diabetes mellitus without complication (HCC)   . Tobacco user   . Vitamin D deficiency    Outpatient Encounter Prescriptions as of 01/26/2017  Medication Sig  . amLODipine (NORVASC) 5 MG tablet Take 1 tablet (5 mg total) by mouth daily.  Marland Kitchen. atorvastatin (LIPITOR) 20 MG tablet TAKE 1 TABLET ONCE DAILY.  Marland Kitchen. Cholecalciferol (VITAMIN D) 2000 UNITS CAPS Take 1 capsule (2,000 Units total) by mouth daily.  . Cyanocobalamin (VITAMIN B 12 PO) Take 1,000 mg by mouth daily.  . fluticasone furoate-vilanterol (BREO ELLIPTA) 100-25 MCG/INH AEPB Inhale 1 puff into the lungs daily.  Marland Kitchen. glipiZIDE (GLUCOTROL) 5 MG tablet TAKE ONE TABLET TWICE A DAY.  Marland Kitchen. glucose blood (ACCU-CHEK AVIVA PLUS) test strip Test once daily  . JANUVIA 100 MG tablet TAKE 1 TABLET ONCE DAILY.  Marland Kitchen. lisinopril-hydrochlorothiazide (PRINZIDE,ZESTORETIC) 20-12.5 MG tablet TAKE 1 TABLET ONCE DAILY.  . metFORMIN (GLUCOPHAGE) 500 MG tablet TAKE ONE TABLET TWICE A DAY.  . metoprolol (LOPRESSOR) 50 MG tablet TAKE ONE TABLET TWICE A DAY.   No facility-administered encounter medications on file as of 01/26/2017.       Review of Systems  HENT: Positive for hearing loss.   Respiratory: Negative.   Cardiovascular: Negative.   Neurological: Negative.   Psychiatric/Behavioral: Negative.        Objective:   Physical Exam    Constitutional: He is oriented to person, place, and time. He appears well-developed and well-nourished.  HENT:  Left ear occluded by cerumen  Cardiovascular: Normal rate, regular rhythm and normal heart sounds.   Pulmonary/Chest: Effort normal and breath sounds normal.  Neurological: He is alert and oriented to person, place, and time.   BP 138/75   Pulse 85   Temp 97.1 F (36.2 C) (Oral)   Ht 6' (1.829 m)   Wt 185 lb 6.4 oz (84.1 kg)   BMI 25.14 kg/m         Assessment & Plan:  1. Essential hypertension Blood pressure is Below 130/74. He is on lisinopril as well as metoprolol and amlodipine  2. Diabetes mellitus without complication (HCC) Last A1c was 6.3, 4 months ago. Will check again today  Frederica KusterStephen M Miller MD

## 2017-02-01 ENCOUNTER — Other Ambulatory Visit: Payer: Self-pay | Admitting: Family Medicine

## 2017-03-30 DIAGNOSIS — E785 Hyperlipidemia, unspecified: Secondary | ICD-10-CM | POA: Diagnosis not present

## 2017-03-30 DIAGNOSIS — Z4682 Encounter for fitting and adjustment of non-vascular catheter: Secondary | ICD-10-CM | POA: Diagnosis not present

## 2017-03-30 DIAGNOSIS — I4510 Unspecified right bundle-branch block: Secondary | ICD-10-CM | POA: Diagnosis not present

## 2017-03-30 DIAGNOSIS — Z7951 Long term (current) use of inhaled steroids: Secondary | ICD-10-CM | POA: Diagnosis not present

## 2017-03-30 DIAGNOSIS — J440 Chronic obstructive pulmonary disease with acute lower respiratory infection: Secondary | ICD-10-CM | POA: Diagnosis not present

## 2017-03-30 DIAGNOSIS — R0600 Dyspnea, unspecified: Secondary | ICD-10-CM | POA: Diagnosis not present

## 2017-03-30 DIAGNOSIS — Z87891 Personal history of nicotine dependence: Secondary | ICD-10-CM | POA: Diagnosis not present

## 2017-03-30 DIAGNOSIS — B953 Streptococcus pneumoniae as the cause of diseases classified elsewhere: Secondary | ICD-10-CM | POA: Diagnosis not present

## 2017-03-30 DIAGNOSIS — J690 Pneumonitis due to inhalation of food and vomit: Secondary | ICD-10-CM | POA: Diagnosis not present

## 2017-03-30 DIAGNOSIS — R6521 Severe sepsis with septic shock: Secondary | ICD-10-CM | POA: Diagnosis not present

## 2017-03-30 DIAGNOSIS — R000 Tachycardia, unspecified: Secondary | ICD-10-CM | POA: Diagnosis not present

## 2017-03-30 DIAGNOSIS — J449 Chronic obstructive pulmonary disease, unspecified: Secondary | ICD-10-CM | POA: Diagnosis not present

## 2017-03-30 DIAGNOSIS — J869 Pyothorax without fistula: Secondary | ICD-10-CM | POA: Diagnosis not present

## 2017-03-30 DIAGNOSIS — I10 Essential (primary) hypertension: Secondary | ICD-10-CM | POA: Diagnosis not present

## 2017-03-30 DIAGNOSIS — Z430 Encounter for attention to tracheostomy: Secondary | ICD-10-CM | POA: Diagnosis not present

## 2017-03-30 DIAGNOSIS — J9601 Acute respiratory failure with hypoxia: Secondary | ICD-10-CM | POA: Diagnosis not present

## 2017-03-30 DIAGNOSIS — I517 Cardiomegaly: Secondary | ICD-10-CM | POA: Diagnosis not present

## 2017-03-30 DIAGNOSIS — A419 Sepsis, unspecified organism: Secondary | ICD-10-CM | POA: Diagnosis not present

## 2017-03-30 DIAGNOSIS — J9602 Acute respiratory failure with hypercapnia: Secondary | ICD-10-CM | POA: Diagnosis not present

## 2017-03-30 DIAGNOSIS — Z79899 Other long term (current) drug therapy: Secondary | ICD-10-CM | POA: Diagnosis not present

## 2017-03-30 DIAGNOSIS — I959 Hypotension, unspecified: Secondary | ICD-10-CM | POA: Diagnosis not present

## 2017-03-30 DIAGNOSIS — J90 Pleural effusion, not elsewhere classified: Secondary | ICD-10-CM | POA: Diagnosis not present

## 2017-03-30 DIAGNOSIS — E870 Hyperosmolality and hypernatremia: Secondary | ICD-10-CM | POA: Diagnosis not present

## 2017-03-30 DIAGNOSIS — J181 Lobar pneumonia, unspecified organism: Secondary | ICD-10-CM | POA: Diagnosis not present

## 2017-03-30 DIAGNOSIS — R918 Other nonspecific abnormal finding of lung field: Secondary | ICD-10-CM | POA: Diagnosis not present

## 2017-03-30 DIAGNOSIS — J189 Pneumonia, unspecified organism: Secondary | ICD-10-CM | POA: Diagnosis not present

## 2017-03-30 DIAGNOSIS — E874 Mixed disorder of acid-base balance: Secondary | ICD-10-CM | POA: Diagnosis not present

## 2017-03-30 DIAGNOSIS — J918 Pleural effusion in other conditions classified elsewhere: Secondary | ICD-10-CM | POA: Diagnosis not present

## 2017-03-30 DIAGNOSIS — A403 Sepsis due to Streptococcus pneumoniae: Secondary | ICD-10-CM | POA: Diagnosis not present

## 2017-03-30 DIAGNOSIS — G9341 Metabolic encephalopathy: Secondary | ICD-10-CM | POA: Diagnosis not present

## 2017-03-30 DIAGNOSIS — J80 Acute respiratory distress syndrome: Secondary | ICD-10-CM | POA: Diagnosis not present

## 2017-03-30 DIAGNOSIS — I998 Other disorder of circulatory system: Secondary | ICD-10-CM | POA: Diagnosis not present

## 2017-03-30 DIAGNOSIS — I491 Atrial premature depolarization: Secondary | ICD-10-CM | POA: Diagnosis not present

## 2017-03-30 DIAGNOSIS — I493 Ventricular premature depolarization: Secondary | ICD-10-CM | POA: Diagnosis not present

## 2017-03-30 DIAGNOSIS — J159 Unspecified bacterial pneumonia: Secondary | ICD-10-CM | POA: Diagnosis not present

## 2017-03-30 DIAGNOSIS — R0602 Shortness of breath: Secondary | ICD-10-CM | POA: Diagnosis not present

## 2017-03-30 DIAGNOSIS — Z431 Encounter for attention to gastrostomy: Secondary | ICD-10-CM | POA: Diagnosis not present

## 2017-03-30 DIAGNOSIS — A409 Streptococcal sepsis, unspecified: Secondary | ICD-10-CM | POA: Diagnosis not present

## 2017-03-30 DIAGNOSIS — R7881 Bacteremia: Secondary | ICD-10-CM | POA: Diagnosis not present

## 2017-03-30 DIAGNOSIS — E871 Hypo-osmolality and hyponatremia: Secondary | ICD-10-CM | POA: Diagnosis not present

## 2017-03-30 DIAGNOSIS — I2119 ST elevation (STEMI) myocardial infarction involving other coronary artery of inferior wall: Secondary | ICD-10-CM | POA: Diagnosis not present

## 2017-03-30 DIAGNOSIS — R410 Disorientation, unspecified: Secondary | ICD-10-CM | POA: Diagnosis not present

## 2017-03-30 DIAGNOSIS — E1142 Type 2 diabetes mellitus with diabetic polyneuropathy: Secondary | ICD-10-CM | POA: Diagnosis not present

## 2017-03-30 DIAGNOSIS — J432 Centrilobular emphysema: Secondary | ICD-10-CM | POA: Diagnosis not present

## 2017-03-30 DIAGNOSIS — Z7984 Long term (current) use of oral hypoglycemic drugs: Secondary | ICD-10-CM | POA: Diagnosis not present

## 2017-03-30 DIAGNOSIS — Z9689 Presence of other specified functional implants: Secondary | ICD-10-CM | POA: Diagnosis not present

## 2017-03-30 DIAGNOSIS — J13 Pneumonia due to Streptococcus pneumoniae: Secondary | ICD-10-CM | POA: Diagnosis not present

## 2017-03-30 DIAGNOSIS — J154 Pneumonia due to other streptococci: Secondary | ICD-10-CM | POA: Diagnosis not present

## 2017-03-30 DIAGNOSIS — N179 Acute kidney failure, unspecified: Secondary | ICD-10-CM | POA: Diagnosis not present

## 2017-03-30 DIAGNOSIS — I471 Supraventricular tachycardia: Secondary | ICD-10-CM | POA: Diagnosis not present

## 2017-03-30 DIAGNOSIS — R911 Solitary pulmonary nodule: Secondary | ICD-10-CM | POA: Diagnosis not present

## 2017-04-14 DIAGNOSIS — R911 Solitary pulmonary nodule: Secondary | ICD-10-CM | POA: Diagnosis not present

## 2017-04-14 DIAGNOSIS — L89151 Pressure ulcer of sacral region, stage 1: Secondary | ICD-10-CM | POA: Diagnosis not present

## 2017-04-14 DIAGNOSIS — I471 Supraventricular tachycardia: Secondary | ICD-10-CM | POA: Diagnosis not present

## 2017-04-14 DIAGNOSIS — J13 Pneumonia due to Streptococcus pneumoniae: Secondary | ICD-10-CM | POA: Diagnosis not present

## 2017-04-14 DIAGNOSIS — J439 Emphysema, unspecified: Secondary | ICD-10-CM | POA: Diagnosis not present

## 2017-04-14 DIAGNOSIS — N401 Enlarged prostate with lower urinary tract symptoms: Secondary | ICD-10-CM | POA: Diagnosis not present

## 2017-04-14 DIAGNOSIS — E1151 Type 2 diabetes mellitus with diabetic peripheral angiopathy without gangrene: Secondary | ICD-10-CM | POA: Diagnosis not present

## 2017-04-14 DIAGNOSIS — S51812D Laceration without foreign body of left forearm, subsequent encounter: Secondary | ICD-10-CM | POA: Diagnosis not present

## 2017-04-14 DIAGNOSIS — R338 Other retention of urine: Secondary | ICD-10-CM | POA: Diagnosis not present

## 2017-04-14 DIAGNOSIS — R260 Ataxic gait: Secondary | ICD-10-CM | POA: Diagnosis not present

## 2017-04-14 DIAGNOSIS — I10 Essential (primary) hypertension: Secondary | ICD-10-CM | POA: Diagnosis not present

## 2017-04-14 DIAGNOSIS — E1142 Type 2 diabetes mellitus with diabetic polyneuropathy: Secondary | ICD-10-CM | POA: Diagnosis not present

## 2017-04-17 DIAGNOSIS — L89151 Pressure ulcer of sacral region, stage 1: Secondary | ICD-10-CM | POA: Diagnosis not present

## 2017-04-17 DIAGNOSIS — N401 Enlarged prostate with lower urinary tract symptoms: Secondary | ICD-10-CM | POA: Diagnosis not present

## 2017-04-17 DIAGNOSIS — J13 Pneumonia due to Streptococcus pneumoniae: Secondary | ICD-10-CM | POA: Diagnosis not present

## 2017-04-17 DIAGNOSIS — J439 Emphysema, unspecified: Secondary | ICD-10-CM | POA: Diagnosis not present

## 2017-04-17 DIAGNOSIS — I10 Essential (primary) hypertension: Secondary | ICD-10-CM | POA: Diagnosis not present

## 2017-04-17 DIAGNOSIS — R338 Other retention of urine: Secondary | ICD-10-CM | POA: Diagnosis not present

## 2017-04-17 DIAGNOSIS — E1151 Type 2 diabetes mellitus with diabetic peripheral angiopathy without gangrene: Secondary | ICD-10-CM | POA: Diagnosis not present

## 2017-04-17 DIAGNOSIS — R260 Ataxic gait: Secondary | ICD-10-CM | POA: Diagnosis not present

## 2017-04-17 DIAGNOSIS — R911 Solitary pulmonary nodule: Secondary | ICD-10-CM | POA: Diagnosis not present

## 2017-04-17 DIAGNOSIS — E1142 Type 2 diabetes mellitus with diabetic polyneuropathy: Secondary | ICD-10-CM | POA: Diagnosis not present

## 2017-04-17 DIAGNOSIS — S51812D Laceration without foreign body of left forearm, subsequent encounter: Secondary | ICD-10-CM | POA: Diagnosis not present

## 2017-04-17 DIAGNOSIS — I471 Supraventricular tachycardia: Secondary | ICD-10-CM | POA: Diagnosis not present

## 2017-04-18 DIAGNOSIS — I10 Essential (primary) hypertension: Secondary | ICD-10-CM | POA: Diagnosis not present

## 2017-04-18 DIAGNOSIS — S51812D Laceration without foreign body of left forearm, subsequent encounter: Secondary | ICD-10-CM | POA: Diagnosis not present

## 2017-04-18 DIAGNOSIS — J13 Pneumonia due to Streptococcus pneumoniae: Secondary | ICD-10-CM | POA: Diagnosis not present

## 2017-04-18 DIAGNOSIS — R911 Solitary pulmonary nodule: Secondary | ICD-10-CM | POA: Diagnosis not present

## 2017-04-18 DIAGNOSIS — E1142 Type 2 diabetes mellitus with diabetic polyneuropathy: Secondary | ICD-10-CM | POA: Diagnosis not present

## 2017-04-18 DIAGNOSIS — I471 Supraventricular tachycardia: Secondary | ICD-10-CM | POA: Diagnosis not present

## 2017-04-18 DIAGNOSIS — J439 Emphysema, unspecified: Secondary | ICD-10-CM | POA: Diagnosis not present

## 2017-04-18 DIAGNOSIS — R338 Other retention of urine: Secondary | ICD-10-CM | POA: Diagnosis not present

## 2017-04-18 DIAGNOSIS — L89151 Pressure ulcer of sacral region, stage 1: Secondary | ICD-10-CM | POA: Diagnosis not present

## 2017-04-18 DIAGNOSIS — E1151 Type 2 diabetes mellitus with diabetic peripheral angiopathy without gangrene: Secondary | ICD-10-CM | POA: Diagnosis not present

## 2017-04-18 DIAGNOSIS — N401 Enlarged prostate with lower urinary tract symptoms: Secondary | ICD-10-CM | POA: Diagnosis not present

## 2017-04-18 DIAGNOSIS — R260 Ataxic gait: Secondary | ICD-10-CM | POA: Diagnosis not present

## 2017-04-19 DIAGNOSIS — R338 Other retention of urine: Secondary | ICD-10-CM | POA: Diagnosis not present

## 2017-04-19 DIAGNOSIS — L89151 Pressure ulcer of sacral region, stage 1: Secondary | ICD-10-CM | POA: Diagnosis not present

## 2017-04-19 DIAGNOSIS — R260 Ataxic gait: Secondary | ICD-10-CM | POA: Diagnosis not present

## 2017-04-19 DIAGNOSIS — I10 Essential (primary) hypertension: Secondary | ICD-10-CM | POA: Diagnosis not present

## 2017-04-19 DIAGNOSIS — S51812D Laceration without foreign body of left forearm, subsequent encounter: Secondary | ICD-10-CM | POA: Diagnosis not present

## 2017-04-19 DIAGNOSIS — E1151 Type 2 diabetes mellitus with diabetic peripheral angiopathy without gangrene: Secondary | ICD-10-CM | POA: Diagnosis not present

## 2017-04-19 DIAGNOSIS — I471 Supraventricular tachycardia: Secondary | ICD-10-CM | POA: Diagnosis not present

## 2017-04-19 DIAGNOSIS — E1142 Type 2 diabetes mellitus with diabetic polyneuropathy: Secondary | ICD-10-CM | POA: Diagnosis not present

## 2017-04-19 DIAGNOSIS — R911 Solitary pulmonary nodule: Secondary | ICD-10-CM | POA: Diagnosis not present

## 2017-04-19 DIAGNOSIS — N401 Enlarged prostate with lower urinary tract symptoms: Secondary | ICD-10-CM | POA: Diagnosis not present

## 2017-04-19 DIAGNOSIS — J439 Emphysema, unspecified: Secondary | ICD-10-CM | POA: Diagnosis not present

## 2017-04-19 DIAGNOSIS — J13 Pneumonia due to Streptococcus pneumoniae: Secondary | ICD-10-CM | POA: Diagnosis not present

## 2017-04-20 DIAGNOSIS — E1142 Type 2 diabetes mellitus with diabetic polyneuropathy: Secondary | ICD-10-CM | POA: Diagnosis not present

## 2017-04-20 DIAGNOSIS — S51812D Laceration without foreign body of left forearm, subsequent encounter: Secondary | ICD-10-CM | POA: Diagnosis not present

## 2017-04-20 DIAGNOSIS — E1151 Type 2 diabetes mellitus with diabetic peripheral angiopathy without gangrene: Secondary | ICD-10-CM | POA: Diagnosis not present

## 2017-04-20 DIAGNOSIS — R911 Solitary pulmonary nodule: Secondary | ICD-10-CM | POA: Diagnosis not present

## 2017-04-20 DIAGNOSIS — R260 Ataxic gait: Secondary | ICD-10-CM | POA: Diagnosis not present

## 2017-04-20 DIAGNOSIS — N401 Enlarged prostate with lower urinary tract symptoms: Secondary | ICD-10-CM | POA: Diagnosis not present

## 2017-04-20 DIAGNOSIS — J13 Pneumonia due to Streptococcus pneumoniae: Secondary | ICD-10-CM | POA: Diagnosis not present

## 2017-04-20 DIAGNOSIS — I10 Essential (primary) hypertension: Secondary | ICD-10-CM | POA: Diagnosis not present

## 2017-04-20 DIAGNOSIS — I471 Supraventricular tachycardia: Secondary | ICD-10-CM | POA: Diagnosis not present

## 2017-04-20 DIAGNOSIS — L89151 Pressure ulcer of sacral region, stage 1: Secondary | ICD-10-CM | POA: Diagnosis not present

## 2017-04-20 DIAGNOSIS — J439 Emphysema, unspecified: Secondary | ICD-10-CM | POA: Diagnosis not present

## 2017-04-20 DIAGNOSIS — R338 Other retention of urine: Secondary | ICD-10-CM | POA: Diagnosis not present

## 2017-04-21 ENCOUNTER — Encounter: Payer: Self-pay | Admitting: Family Medicine

## 2017-04-21 ENCOUNTER — Ambulatory Visit (INDEPENDENT_AMBULATORY_CARE_PROVIDER_SITE_OTHER): Payer: Medicare Other | Admitting: Family Medicine

## 2017-04-21 VITALS — BP 126/78 | HR 85 | Temp 97.9°F | Ht 72.0 in | Wt 180.0 lb

## 2017-04-21 DIAGNOSIS — E119 Type 2 diabetes mellitus without complications: Secondary | ICD-10-CM

## 2017-04-21 DIAGNOSIS — I10 Essential (primary) hypertension: Secondary | ICD-10-CM | POA: Diagnosis not present

## 2017-04-21 DIAGNOSIS — J13 Pneumonia due to Streptococcus pneumoniae: Secondary | ICD-10-CM

## 2017-04-21 MED ORDER — LISINOPRIL 5 MG PO TABS: 5.0000 mg | ORAL_TABLET | Freq: Every day | ORAL | 2 refills | Status: DC

## 2017-04-21 NOTE — Progress Notes (Signed)
BP 126/78   Pulse 85   Temp 97.9 F (36.6 C) (Oral)   Ht 6' (1.829 m)   Wt 180 lb (81.6 kg)   SpO2 95%   BMI 24.41 kg/m    Subjective:    Patient ID: Ray Carlson, male    DOB: 1946/07/17, 71 y.o.   MRN: [id number]  HPI: Ray Carlson is a 71 y.o. male presenting on 04/21/2017 for Hospital followup (at [facility], for pneumonia, home x 10 days; records in Care Everywhere) and Discuss medications (episodes of low blood sugar since home; was on Metoprolol, Lisinopril & Amolodine before hospitalization, only on Metoprolol now - should he start back on meds)   HPI Hospital follow-up for pneumonia Patient is coming in today for hospital follow-up for pneumonia. He was admitted to the hospital on 03/30/2017 and discharged on 04/12/2017. During this time he was diagnosed with emphysema and COPD and pneumonia. They called a streptococcal pneumonia. He was on antibiotics and inhalers and breathing machines and took some time to fully improve. Today he is coming in then he says he still has a little bit of residual cough but is otherwise doing very well. He does have an appointment to follow up with pulmonology as well after the hospitalization. During the hospitalization they found him to be hypotensive and discontinued some of his blood pressure medications. He also had a few episodes of hypoglycemia and they discontinued some of his diabetic medications. Since leaving the hospital he has continued to have some hypoglycemic episodes down to the 60s and even one time in the low 40s. He is taking metformin and Januvia and glipizide but he has not been taking the glipizide all of the time because of the hypoglycemic episodes. He denies any lightheadedness or dizziness but does feel shaky and jittery when his blood sugars have been down. He denies any fevers or chills or chest pain.  Relevant past medical, surgical, family and social history reviewed and updated as indicated. Interim medical history since our  last visit reviewed. Allergies and medications reviewed and updated.  Review of Systems  Constitutional: Negative for chills and fever.  HENT: Positive for congestion. Negative for dental problem, sinus pain, sinus pressure, sneezing and sore throat.   Eyes: Negative for discharge.  Respiratory: Positive for cough. Negative for shortness of breath and wheezing.   Cardiovascular: Negative for chest pain, palpitations and leg swelling.  Musculoskeletal: Negative for back pain and gait problem.  Skin: Negative for rash.  Neurological: Positive for tremors (During hypoglycemic episodes). Negative for dizziness, light-headedness and headaches.  All other systems reviewed and are negative.   Per HPI unless specifically indicated above        Objective:    BP 126/78   Pulse 85   Temp 97.9 F (36.6 C) (Oral)   Ht 6' (1.829 m)   Wt 180 lb (81.6 kg)   SpO2 95%   BMI 24.41 kg/m   Wt Readings from Last 3 Encounters:  04/21/17 180 lb (81.6 kg)  01/26/17 185 lb 6.4 oz (84.1 kg)  09/20/16 187 lb (84.8 kg)    Physical Exam  Constitutional: He is oriented to person, place, and time. He appears well-developed and well-nourished. No distress.  Eyes: Conjunctivae are normal. No scleral icterus.  Neck: Neck supple. No thyromegaly present.  Cardiovascular: Normal rate, regular rhythm, normal heart sounds and intact distal pulses.   No murmur heard. Pulmonary/Chest: Effort normal. No respiratory distress. He has no decreased breath sounds.  He has no wheezes. He has rhonchi in the right upper field, the right lower field, the left upper field, the left middle field and the left lower field. He has no rales.  Musculoskeletal: Normal range of motion. He exhibits no edema.  Lymphadenopathy:    He has no cervical adenopathy.  Neurological: He is alert and oriented to person, place, and time. Coordination normal.  Skin: Skin is warm and dry. No rash noted. He is not diaphoretic.  Psychiatric: He  has a normal mood and affect. His behavior is normal.  Nursing note and vitals reviewed.       Assessment & Plan:   Problem List Items Addressed This Visit      Cardiovascular and Mediastinum   Hypertension   Relevant Medications   furosemide (LASIX) 20 MG tablet   lisinopril (PRINIVIL,ZESTRIL) 5 MG tablet     Endocrine   Diabetes mellitus without complication (HCC)   Relevant Medications   lisinopril (PRINIVIL,ZESTRIL) 5 MG tablet    Other Visit Diagnoses    Pneumonia due to Streptococcus pneumoniae, unspecified laterality, unspecified part of lung (HCC)    -  Primary   Much improved, has a small amount of cough left otherwise doing well, has an appointment with pulmonology.   Relevant Medications   levofloxacin (LEVAQUIN) 750 MG tablet   tiotropium (SPIRIVA) 18 MCG inhalation capsule      Overall patient has been feeling much improved and still has a couple days left of Levaquin. He has a visit with pulmonology this coming week and we will continue to monitor from a distance following their lead.  Stop the glipizide altogether and added lisinopril 5 mg for renal protection  Follow up plan: Return in about 4 weeks (around 05/19/2017), or if symptoms worsen or fail to improve, for Recheck diabetes.  Counseling provided for all of the vaccine components No orders of the defined types were placed in this encounter.   Arville CareJoshua Dettinger, MD [facility] 04/21/2017, 11:47 AM

## 2017-04-24 DIAGNOSIS — J13 Pneumonia due to Streptococcus pneumoniae: Secondary | ICD-10-CM | POA: Diagnosis not present

## 2017-04-24 DIAGNOSIS — R260 Ataxic gait: Secondary | ICD-10-CM | POA: Diagnosis not present

## 2017-04-24 DIAGNOSIS — R911 Solitary pulmonary nodule: Secondary | ICD-10-CM | POA: Diagnosis not present

## 2017-04-24 DIAGNOSIS — N401 Enlarged prostate with lower urinary tract symptoms: Secondary | ICD-10-CM | POA: Diagnosis not present

## 2017-04-24 DIAGNOSIS — I471 Supraventricular tachycardia: Secondary | ICD-10-CM | POA: Diagnosis not present

## 2017-04-24 DIAGNOSIS — E1151 Type 2 diabetes mellitus with diabetic peripheral angiopathy without gangrene: Secondary | ICD-10-CM | POA: Diagnosis not present

## 2017-04-24 DIAGNOSIS — S51812D Laceration without foreign body of left forearm, subsequent encounter: Secondary | ICD-10-CM | POA: Diagnosis not present

## 2017-04-24 DIAGNOSIS — R338 Other retention of urine: Secondary | ICD-10-CM | POA: Diagnosis not present

## 2017-04-24 DIAGNOSIS — E1142 Type 2 diabetes mellitus with diabetic polyneuropathy: Secondary | ICD-10-CM | POA: Diagnosis not present

## 2017-04-24 DIAGNOSIS — I10 Essential (primary) hypertension: Secondary | ICD-10-CM | POA: Diagnosis not present

## 2017-04-24 DIAGNOSIS — L89151 Pressure ulcer of sacral region, stage 1: Secondary | ICD-10-CM | POA: Diagnosis not present

## 2017-04-24 DIAGNOSIS — J439 Emphysema, unspecified: Secondary | ICD-10-CM | POA: Diagnosis not present

## 2017-04-25 DIAGNOSIS — Z87891 Personal history of nicotine dependence: Secondary | ICD-10-CM | POA: Diagnosis not present

## 2017-04-25 DIAGNOSIS — J449 Chronic obstructive pulmonary disease, unspecified: Secondary | ICD-10-CM | POA: Diagnosis not present

## 2017-04-25 DIAGNOSIS — Z7951 Long term (current) use of inhaled steroids: Secondary | ICD-10-CM | POA: Diagnosis not present

## 2017-04-25 DIAGNOSIS — R0602 Shortness of breath: Secondary | ICD-10-CM | POA: Diagnosis not present

## 2017-04-25 DIAGNOSIS — J988 Other specified respiratory disorders: Secondary | ICD-10-CM | POA: Diagnosis not present

## 2017-04-25 DIAGNOSIS — R918 Other nonspecific abnormal finding of lung field: Secondary | ICD-10-CM | POA: Diagnosis not present

## 2017-04-26 DIAGNOSIS — I471 Supraventricular tachycardia: Secondary | ICD-10-CM | POA: Diagnosis not present

## 2017-04-26 DIAGNOSIS — R260 Ataxic gait: Secondary | ICD-10-CM | POA: Diagnosis not present

## 2017-04-26 DIAGNOSIS — N401 Enlarged prostate with lower urinary tract symptoms: Secondary | ICD-10-CM | POA: Diagnosis not present

## 2017-04-26 DIAGNOSIS — I10 Essential (primary) hypertension: Secondary | ICD-10-CM | POA: Diagnosis not present

## 2017-04-26 DIAGNOSIS — S51812D Laceration without foreign body of left forearm, subsequent encounter: Secondary | ICD-10-CM | POA: Diagnosis not present

## 2017-04-26 DIAGNOSIS — E1142 Type 2 diabetes mellitus with diabetic polyneuropathy: Secondary | ICD-10-CM | POA: Diagnosis not present

## 2017-04-26 DIAGNOSIS — L89151 Pressure ulcer of sacral region, stage 1: Secondary | ICD-10-CM | POA: Diagnosis not present

## 2017-04-26 DIAGNOSIS — E1151 Type 2 diabetes mellitus with diabetic peripheral angiopathy without gangrene: Secondary | ICD-10-CM | POA: Diagnosis not present

## 2017-04-26 DIAGNOSIS — J13 Pneumonia due to Streptococcus pneumoniae: Secondary | ICD-10-CM | POA: Diagnosis not present

## 2017-04-26 DIAGNOSIS — J439 Emphysema, unspecified: Secondary | ICD-10-CM | POA: Diagnosis not present

## 2017-04-26 DIAGNOSIS — R338 Other retention of urine: Secondary | ICD-10-CM | POA: Diagnosis not present

## 2017-04-26 DIAGNOSIS — R911 Solitary pulmonary nodule: Secondary | ICD-10-CM | POA: Diagnosis not present

## 2017-04-28 DIAGNOSIS — N401 Enlarged prostate with lower urinary tract symptoms: Secondary | ICD-10-CM | POA: Diagnosis not present

## 2017-04-28 DIAGNOSIS — S51812D Laceration without foreign body of left forearm, subsequent encounter: Secondary | ICD-10-CM | POA: Diagnosis not present

## 2017-04-28 DIAGNOSIS — R911 Solitary pulmonary nodule: Secondary | ICD-10-CM | POA: Diagnosis not present

## 2017-04-28 DIAGNOSIS — L89151 Pressure ulcer of sacral region, stage 1: Secondary | ICD-10-CM | POA: Diagnosis not present

## 2017-04-28 DIAGNOSIS — R338 Other retention of urine: Secondary | ICD-10-CM | POA: Diagnosis not present

## 2017-04-28 DIAGNOSIS — E1151 Type 2 diabetes mellitus with diabetic peripheral angiopathy without gangrene: Secondary | ICD-10-CM | POA: Diagnosis not present

## 2017-04-28 DIAGNOSIS — E1142 Type 2 diabetes mellitus with diabetic polyneuropathy: Secondary | ICD-10-CM | POA: Diagnosis not present

## 2017-04-28 DIAGNOSIS — I10 Essential (primary) hypertension: Secondary | ICD-10-CM | POA: Diagnosis not present

## 2017-04-28 DIAGNOSIS — R260 Ataxic gait: Secondary | ICD-10-CM | POA: Diagnosis not present

## 2017-04-28 DIAGNOSIS — J439 Emphysema, unspecified: Secondary | ICD-10-CM | POA: Diagnosis not present

## 2017-04-28 DIAGNOSIS — J13 Pneumonia due to Streptococcus pneumoniae: Secondary | ICD-10-CM | POA: Diagnosis not present

## 2017-04-28 DIAGNOSIS — I471 Supraventricular tachycardia: Secondary | ICD-10-CM | POA: Diagnosis not present

## 2017-05-01 ENCOUNTER — Telehealth: Payer: Self-pay | Admitting: Family Medicine

## 2017-05-01 DIAGNOSIS — J439 Emphysema, unspecified: Secondary | ICD-10-CM | POA: Diagnosis not present

## 2017-05-01 DIAGNOSIS — R911 Solitary pulmonary nodule: Secondary | ICD-10-CM | POA: Diagnosis not present

## 2017-05-01 DIAGNOSIS — E1142 Type 2 diabetes mellitus with diabetic polyneuropathy: Secondary | ICD-10-CM | POA: Diagnosis not present

## 2017-05-01 DIAGNOSIS — I10 Essential (primary) hypertension: Secondary | ICD-10-CM | POA: Diagnosis not present

## 2017-05-01 DIAGNOSIS — J13 Pneumonia due to Streptococcus pneumoniae: Secondary | ICD-10-CM | POA: Diagnosis not present

## 2017-05-01 DIAGNOSIS — R338 Other retention of urine: Secondary | ICD-10-CM | POA: Diagnosis not present

## 2017-05-01 DIAGNOSIS — E1151 Type 2 diabetes mellitus with diabetic peripheral angiopathy without gangrene: Secondary | ICD-10-CM | POA: Diagnosis not present

## 2017-05-01 DIAGNOSIS — N401 Enlarged prostate with lower urinary tract symptoms: Secondary | ICD-10-CM | POA: Diagnosis not present

## 2017-05-01 DIAGNOSIS — L89151 Pressure ulcer of sacral region, stage 1: Secondary | ICD-10-CM | POA: Diagnosis not present

## 2017-05-01 DIAGNOSIS — R260 Ataxic gait: Secondary | ICD-10-CM | POA: Diagnosis not present

## 2017-05-01 DIAGNOSIS — S51812D Laceration without foreign body of left forearm, subsequent encounter: Secondary | ICD-10-CM | POA: Diagnosis not present

## 2017-05-01 DIAGNOSIS — I471 Supraventricular tachycardia: Secondary | ICD-10-CM | POA: Diagnosis not present

## 2017-05-02 ENCOUNTER — Other Ambulatory Visit: Payer: Self-pay | Admitting: Family Medicine

## 2017-05-02 DIAGNOSIS — I10 Essential (primary) hypertension: Secondary | ICD-10-CM | POA: Diagnosis not present

## 2017-05-02 DIAGNOSIS — I471 Supraventricular tachycardia: Secondary | ICD-10-CM | POA: Diagnosis not present

## 2017-05-02 DIAGNOSIS — N401 Enlarged prostate with lower urinary tract symptoms: Secondary | ICD-10-CM | POA: Diagnosis not present

## 2017-05-02 DIAGNOSIS — E1151 Type 2 diabetes mellitus with diabetic peripheral angiopathy without gangrene: Secondary | ICD-10-CM | POA: Diagnosis not present

## 2017-05-02 DIAGNOSIS — J439 Emphysema, unspecified: Secondary | ICD-10-CM | POA: Diagnosis not present

## 2017-05-02 DIAGNOSIS — S51812D Laceration without foreign body of left forearm, subsequent encounter: Secondary | ICD-10-CM | POA: Diagnosis not present

## 2017-05-02 DIAGNOSIS — R260 Ataxic gait: Secondary | ICD-10-CM | POA: Diagnosis not present

## 2017-05-02 DIAGNOSIS — J13 Pneumonia due to Streptococcus pneumoniae: Secondary | ICD-10-CM | POA: Diagnosis not present

## 2017-05-02 DIAGNOSIS — R911 Solitary pulmonary nodule: Secondary | ICD-10-CM | POA: Diagnosis not present

## 2017-05-02 DIAGNOSIS — E1142 Type 2 diabetes mellitus with diabetic polyneuropathy: Secondary | ICD-10-CM | POA: Diagnosis not present

## 2017-05-02 DIAGNOSIS — L89151 Pressure ulcer of sacral region, stage 1: Secondary | ICD-10-CM | POA: Diagnosis not present

## 2017-05-02 DIAGNOSIS — R338 Other retention of urine: Secondary | ICD-10-CM | POA: Diagnosis not present

## 2017-05-02 NOTE — Telephone Encounter (Signed)
noted 

## 2017-05-03 DIAGNOSIS — J439 Emphysema, unspecified: Secondary | ICD-10-CM | POA: Diagnosis not present

## 2017-05-03 DIAGNOSIS — J13 Pneumonia due to Streptococcus pneumoniae: Secondary | ICD-10-CM | POA: Diagnosis not present

## 2017-05-03 DIAGNOSIS — R260 Ataxic gait: Secondary | ICD-10-CM | POA: Diagnosis not present

## 2017-05-03 DIAGNOSIS — I10 Essential (primary) hypertension: Secondary | ICD-10-CM | POA: Diagnosis not present

## 2017-05-03 DIAGNOSIS — R911 Solitary pulmonary nodule: Secondary | ICD-10-CM | POA: Diagnosis not present

## 2017-05-03 DIAGNOSIS — E1142 Type 2 diabetes mellitus with diabetic polyneuropathy: Secondary | ICD-10-CM | POA: Diagnosis not present

## 2017-05-03 DIAGNOSIS — N401 Enlarged prostate with lower urinary tract symptoms: Secondary | ICD-10-CM | POA: Diagnosis not present

## 2017-05-03 DIAGNOSIS — L89151 Pressure ulcer of sacral region, stage 1: Secondary | ICD-10-CM | POA: Diagnosis not present

## 2017-05-03 DIAGNOSIS — R338 Other retention of urine: Secondary | ICD-10-CM | POA: Diagnosis not present

## 2017-05-03 DIAGNOSIS — S51812D Laceration without foreign body of left forearm, subsequent encounter: Secondary | ICD-10-CM | POA: Diagnosis not present

## 2017-05-03 DIAGNOSIS — E1151 Type 2 diabetes mellitus with diabetic peripheral angiopathy without gangrene: Secondary | ICD-10-CM | POA: Diagnosis not present

## 2017-05-03 DIAGNOSIS — I471 Supraventricular tachycardia: Secondary | ICD-10-CM | POA: Diagnosis not present

## 2017-05-04 DIAGNOSIS — N401 Enlarged prostate with lower urinary tract symptoms: Secondary | ICD-10-CM | POA: Diagnosis not present

## 2017-05-04 DIAGNOSIS — E1142 Type 2 diabetes mellitus with diabetic polyneuropathy: Secondary | ICD-10-CM | POA: Diagnosis not present

## 2017-05-04 DIAGNOSIS — I10 Essential (primary) hypertension: Secondary | ICD-10-CM | POA: Diagnosis not present

## 2017-05-04 DIAGNOSIS — I471 Supraventricular tachycardia: Secondary | ICD-10-CM | POA: Diagnosis not present

## 2017-05-04 DIAGNOSIS — L89151 Pressure ulcer of sacral region, stage 1: Secondary | ICD-10-CM | POA: Diagnosis not present

## 2017-05-04 DIAGNOSIS — R911 Solitary pulmonary nodule: Secondary | ICD-10-CM | POA: Diagnosis not present

## 2017-05-04 DIAGNOSIS — S51812D Laceration without foreign body of left forearm, subsequent encounter: Secondary | ICD-10-CM | POA: Diagnosis not present

## 2017-05-04 DIAGNOSIS — J13 Pneumonia due to Streptococcus pneumoniae: Secondary | ICD-10-CM | POA: Diagnosis not present

## 2017-05-04 DIAGNOSIS — E1151 Type 2 diabetes mellitus with diabetic peripheral angiopathy without gangrene: Secondary | ICD-10-CM | POA: Diagnosis not present

## 2017-05-04 DIAGNOSIS — R260 Ataxic gait: Secondary | ICD-10-CM | POA: Diagnosis not present

## 2017-05-04 DIAGNOSIS — J439 Emphysema, unspecified: Secondary | ICD-10-CM | POA: Diagnosis not present

## 2017-05-04 DIAGNOSIS — R338 Other retention of urine: Secondary | ICD-10-CM | POA: Diagnosis not present

## 2017-05-09 DIAGNOSIS — R911 Solitary pulmonary nodule: Secondary | ICD-10-CM | POA: Diagnosis not present

## 2017-05-09 DIAGNOSIS — L89151 Pressure ulcer of sacral region, stage 1: Secondary | ICD-10-CM | POA: Diagnosis not present

## 2017-05-09 DIAGNOSIS — I10 Essential (primary) hypertension: Secondary | ICD-10-CM | POA: Diagnosis not present

## 2017-05-09 DIAGNOSIS — S51812D Laceration without foreign body of left forearm, subsequent encounter: Secondary | ICD-10-CM | POA: Diagnosis not present

## 2017-05-09 DIAGNOSIS — J439 Emphysema, unspecified: Secondary | ICD-10-CM | POA: Diagnosis not present

## 2017-05-09 DIAGNOSIS — R338 Other retention of urine: Secondary | ICD-10-CM | POA: Diagnosis not present

## 2017-05-09 DIAGNOSIS — J13 Pneumonia due to Streptococcus pneumoniae: Secondary | ICD-10-CM | POA: Diagnosis not present

## 2017-05-09 DIAGNOSIS — E1142 Type 2 diabetes mellitus with diabetic polyneuropathy: Secondary | ICD-10-CM | POA: Diagnosis not present

## 2017-05-09 DIAGNOSIS — E1151 Type 2 diabetes mellitus with diabetic peripheral angiopathy without gangrene: Secondary | ICD-10-CM | POA: Diagnosis not present

## 2017-05-09 DIAGNOSIS — N401 Enlarged prostate with lower urinary tract symptoms: Secondary | ICD-10-CM | POA: Diagnosis not present

## 2017-05-09 DIAGNOSIS — R260 Ataxic gait: Secondary | ICD-10-CM | POA: Diagnosis not present

## 2017-05-09 DIAGNOSIS — I471 Supraventricular tachycardia: Secondary | ICD-10-CM | POA: Diagnosis not present

## 2017-05-10 ENCOUNTER — Telehealth: Payer: Self-pay | Admitting: *Deleted

## 2017-05-10 DIAGNOSIS — R260 Ataxic gait: Secondary | ICD-10-CM | POA: Diagnosis not present

## 2017-05-10 DIAGNOSIS — R911 Solitary pulmonary nodule: Secondary | ICD-10-CM | POA: Diagnosis not present

## 2017-05-10 DIAGNOSIS — I10 Essential (primary) hypertension: Secondary | ICD-10-CM | POA: Diagnosis not present

## 2017-05-10 DIAGNOSIS — E1151 Type 2 diabetes mellitus with diabetic peripheral angiopathy without gangrene: Secondary | ICD-10-CM | POA: Diagnosis not present

## 2017-05-10 DIAGNOSIS — J13 Pneumonia due to Streptococcus pneumoniae: Secondary | ICD-10-CM | POA: Diagnosis not present

## 2017-05-10 DIAGNOSIS — I471 Supraventricular tachycardia: Secondary | ICD-10-CM | POA: Diagnosis not present

## 2017-05-10 DIAGNOSIS — R338 Other retention of urine: Secondary | ICD-10-CM | POA: Diagnosis not present

## 2017-05-10 DIAGNOSIS — J439 Emphysema, unspecified: Secondary | ICD-10-CM | POA: Diagnosis not present

## 2017-05-10 DIAGNOSIS — E1142 Type 2 diabetes mellitus with diabetic polyneuropathy: Secondary | ICD-10-CM | POA: Diagnosis not present

## 2017-05-10 DIAGNOSIS — S51812D Laceration without foreign body of left forearm, subsequent encounter: Secondary | ICD-10-CM | POA: Diagnosis not present

## 2017-05-10 DIAGNOSIS — L89151 Pressure ulcer of sacral region, stage 1: Secondary | ICD-10-CM | POA: Diagnosis not present

## 2017-05-10 DIAGNOSIS — N401 Enlarged prostate with lower urinary tract symptoms: Secondary | ICD-10-CM | POA: Diagnosis not present

## 2017-05-10 MED ORDER — GLUCOSE BLOOD VI STRP: ORAL_STRIP | 2 refills | Status: DC

## 2017-05-10 NOTE — Telephone Encounter (Signed)
His blood sugars are running high again. They have been 264, 221, 195. 173 this week fasting. His normal is 90-120. The glipizide was stopped due to low readings. Please advise

## 2017-05-10 NOTE — Telephone Encounter (Signed)
Have him take half of the glipizide and see how it does

## 2017-05-10 NOTE — Telephone Encounter (Signed)
Notified pt's son Clide CliffRicky and Meriam SpragueBeverly with [facility] of Dr Dettinger's recommendation Both verbalize understanding

## 2017-05-10 NOTE — Telephone Encounter (Signed)
done

## 2017-05-11 ENCOUNTER — Ambulatory Visit (INDEPENDENT_AMBULATORY_CARE_PROVIDER_SITE_OTHER): Payer: Medicare Other | Admitting: Family Medicine

## 2017-05-11 DIAGNOSIS — R338 Other retention of urine: Secondary | ICD-10-CM | POA: Diagnosis not present

## 2017-05-11 DIAGNOSIS — J439 Emphysema, unspecified: Secondary | ICD-10-CM

## 2017-05-11 DIAGNOSIS — L89151 Pressure ulcer of sacral region, stage 1: Secondary | ICD-10-CM

## 2017-05-11 DIAGNOSIS — E1151 Type 2 diabetes mellitus with diabetic peripheral angiopathy without gangrene: Secondary | ICD-10-CM | POA: Diagnosis not present

## 2017-05-11 DIAGNOSIS — J13 Pneumonia due to Streptococcus pneumoniae: Secondary | ICD-10-CM

## 2017-05-11 DIAGNOSIS — E1142 Type 2 diabetes mellitus with diabetic polyneuropathy: Secondary | ICD-10-CM

## 2017-05-11 DIAGNOSIS — I10 Essential (primary) hypertension: Secondary | ICD-10-CM | POA: Diagnosis not present

## 2017-05-11 DIAGNOSIS — S51812D Laceration without foreign body of left forearm, subsequent encounter: Secondary | ICD-10-CM | POA: Diagnosis not present

## 2017-05-11 DIAGNOSIS — R260 Ataxic gait: Secondary | ICD-10-CM | POA: Diagnosis not present

## 2017-05-11 DIAGNOSIS — R911 Solitary pulmonary nodule: Secondary | ICD-10-CM | POA: Diagnosis not present

## 2017-05-11 DIAGNOSIS — I471 Supraventricular tachycardia: Secondary | ICD-10-CM | POA: Diagnosis not present

## 2017-05-11 DIAGNOSIS — N401 Enlarged prostate with lower urinary tract symptoms: Secondary | ICD-10-CM | POA: Diagnosis not present

## 2017-05-16 DIAGNOSIS — E1142 Type 2 diabetes mellitus with diabetic polyneuropathy: Secondary | ICD-10-CM | POA: Diagnosis not present

## 2017-05-16 DIAGNOSIS — E1151 Type 2 diabetes mellitus with diabetic peripheral angiopathy without gangrene: Secondary | ICD-10-CM | POA: Diagnosis not present

## 2017-05-16 DIAGNOSIS — J13 Pneumonia due to Streptococcus pneumoniae: Secondary | ICD-10-CM | POA: Diagnosis not present

## 2017-05-16 DIAGNOSIS — R338 Other retention of urine: Secondary | ICD-10-CM | POA: Diagnosis not present

## 2017-05-16 DIAGNOSIS — J439 Emphysema, unspecified: Secondary | ICD-10-CM | POA: Diagnosis not present

## 2017-05-16 DIAGNOSIS — L89151 Pressure ulcer of sacral region, stage 1: Secondary | ICD-10-CM | POA: Diagnosis not present

## 2017-05-16 DIAGNOSIS — S51812D Laceration without foreign body of left forearm, subsequent encounter: Secondary | ICD-10-CM | POA: Diagnosis not present

## 2017-05-16 DIAGNOSIS — R260 Ataxic gait: Secondary | ICD-10-CM | POA: Diagnosis not present

## 2017-05-16 DIAGNOSIS — N401 Enlarged prostate with lower urinary tract symptoms: Secondary | ICD-10-CM | POA: Diagnosis not present

## 2017-05-16 DIAGNOSIS — I471 Supraventricular tachycardia: Secondary | ICD-10-CM | POA: Diagnosis not present

## 2017-05-16 DIAGNOSIS — R911 Solitary pulmonary nodule: Secondary | ICD-10-CM | POA: Diagnosis not present

## 2017-05-16 DIAGNOSIS — I10 Essential (primary) hypertension: Secondary | ICD-10-CM | POA: Diagnosis not present

## 2017-05-18 DIAGNOSIS — L89151 Pressure ulcer of sacral region, stage 1: Secondary | ICD-10-CM | POA: Diagnosis not present

## 2017-05-18 DIAGNOSIS — J13 Pneumonia due to Streptococcus pneumoniae: Secondary | ICD-10-CM | POA: Diagnosis not present

## 2017-05-18 DIAGNOSIS — S51812D Laceration without foreign body of left forearm, subsequent encounter: Secondary | ICD-10-CM | POA: Diagnosis not present

## 2017-05-18 DIAGNOSIS — N401 Enlarged prostate with lower urinary tract symptoms: Secondary | ICD-10-CM | POA: Diagnosis not present

## 2017-05-18 DIAGNOSIS — R260 Ataxic gait: Secondary | ICD-10-CM | POA: Diagnosis not present

## 2017-05-18 DIAGNOSIS — R338 Other retention of urine: Secondary | ICD-10-CM | POA: Diagnosis not present

## 2017-05-18 DIAGNOSIS — E1151 Type 2 diabetes mellitus with diabetic peripheral angiopathy without gangrene: Secondary | ICD-10-CM | POA: Diagnosis not present

## 2017-05-18 DIAGNOSIS — R911 Solitary pulmonary nodule: Secondary | ICD-10-CM | POA: Diagnosis not present

## 2017-05-18 DIAGNOSIS — J439 Emphysema, unspecified: Secondary | ICD-10-CM | POA: Diagnosis not present

## 2017-05-18 DIAGNOSIS — I471 Supraventricular tachycardia: Secondary | ICD-10-CM | POA: Diagnosis not present

## 2017-05-18 DIAGNOSIS — I10 Essential (primary) hypertension: Secondary | ICD-10-CM | POA: Diagnosis not present

## 2017-05-18 DIAGNOSIS — E1142 Type 2 diabetes mellitus with diabetic polyneuropathy: Secondary | ICD-10-CM | POA: Diagnosis not present

## 2017-05-22 DIAGNOSIS — I471 Supraventricular tachycardia: Secondary | ICD-10-CM | POA: Diagnosis not present

## 2017-05-22 DIAGNOSIS — R338 Other retention of urine: Secondary | ICD-10-CM | POA: Diagnosis not present

## 2017-05-22 DIAGNOSIS — R260 Ataxic gait: Secondary | ICD-10-CM | POA: Diagnosis not present

## 2017-05-22 DIAGNOSIS — R911 Solitary pulmonary nodule: Secondary | ICD-10-CM | POA: Diagnosis not present

## 2017-05-22 DIAGNOSIS — S51812D Laceration without foreign body of left forearm, subsequent encounter: Secondary | ICD-10-CM | POA: Diagnosis not present

## 2017-05-22 DIAGNOSIS — N401 Enlarged prostate with lower urinary tract symptoms: Secondary | ICD-10-CM | POA: Diagnosis not present

## 2017-05-22 DIAGNOSIS — I10 Essential (primary) hypertension: Secondary | ICD-10-CM | POA: Diagnosis not present

## 2017-05-22 DIAGNOSIS — E1142 Type 2 diabetes mellitus with diabetic polyneuropathy: Secondary | ICD-10-CM | POA: Diagnosis not present

## 2017-05-22 DIAGNOSIS — L89151 Pressure ulcer of sacral region, stage 1: Secondary | ICD-10-CM | POA: Diagnosis not present

## 2017-05-22 DIAGNOSIS — E1151 Type 2 diabetes mellitus with diabetic peripheral angiopathy without gangrene: Secondary | ICD-10-CM | POA: Diagnosis not present

## 2017-05-22 DIAGNOSIS — J439 Emphysema, unspecified: Secondary | ICD-10-CM | POA: Diagnosis not present

## 2017-05-22 DIAGNOSIS — J13 Pneumonia due to Streptococcus pneumoniae: Secondary | ICD-10-CM | POA: Diagnosis not present

## 2017-05-23 DIAGNOSIS — J13 Pneumonia due to Streptococcus pneumoniae: Secondary | ICD-10-CM | POA: Diagnosis not present

## 2017-05-23 DIAGNOSIS — R260 Ataxic gait: Secondary | ICD-10-CM | POA: Diagnosis not present

## 2017-05-23 DIAGNOSIS — L89151 Pressure ulcer of sacral region, stage 1: Secondary | ICD-10-CM | POA: Diagnosis not present

## 2017-05-23 DIAGNOSIS — N401 Enlarged prostate with lower urinary tract symptoms: Secondary | ICD-10-CM | POA: Diagnosis not present

## 2017-05-23 DIAGNOSIS — E1142 Type 2 diabetes mellitus with diabetic polyneuropathy: Secondary | ICD-10-CM | POA: Diagnosis not present

## 2017-05-23 DIAGNOSIS — E1151 Type 2 diabetes mellitus with diabetic peripheral angiopathy without gangrene: Secondary | ICD-10-CM | POA: Diagnosis not present

## 2017-05-23 DIAGNOSIS — R338 Other retention of urine: Secondary | ICD-10-CM | POA: Diagnosis not present

## 2017-05-23 DIAGNOSIS — J439 Emphysema, unspecified: Secondary | ICD-10-CM | POA: Diagnosis not present

## 2017-05-23 DIAGNOSIS — I10 Essential (primary) hypertension: Secondary | ICD-10-CM | POA: Diagnosis not present

## 2017-05-23 DIAGNOSIS — R911 Solitary pulmonary nodule: Secondary | ICD-10-CM | POA: Diagnosis not present

## 2017-05-23 DIAGNOSIS — I471 Supraventricular tachycardia: Secondary | ICD-10-CM | POA: Diagnosis not present

## 2017-05-23 DIAGNOSIS — S51812D Laceration without foreign body of left forearm, subsequent encounter: Secondary | ICD-10-CM | POA: Diagnosis not present

## 2017-05-29 ENCOUNTER — Ambulatory Visit: Payer: Medicare Other | Admitting: Family Medicine

## 2017-05-29 ENCOUNTER — Encounter: Payer: Self-pay | Admitting: Family Medicine

## 2017-05-29 ENCOUNTER — Ambulatory Visit (INDEPENDENT_AMBULATORY_CARE_PROVIDER_SITE_OTHER): Payer: Medicare Other | Admitting: Family Medicine

## 2017-05-29 VITALS — BP 126/72 | HR 87 | Temp 96.9°F | Ht 72.0 in | Wt 178.4 lb

## 2017-05-29 DIAGNOSIS — B351 Tinea unguium: Secondary | ICD-10-CM | POA: Diagnosis not present

## 2017-05-29 DIAGNOSIS — I10 Essential (primary) hypertension: Secondary | ICD-10-CM | POA: Diagnosis not present

## 2017-05-29 DIAGNOSIS — J449 Chronic obstructive pulmonary disease, unspecified: Secondary | ICD-10-CM | POA: Insufficient documentation

## 2017-05-29 DIAGNOSIS — Z1159 Encounter for screening for other viral diseases: Secondary | ICD-10-CM

## 2017-05-29 DIAGNOSIS — E782 Mixed hyperlipidemia: Secondary | ICD-10-CM | POA: Diagnosis not present

## 2017-05-29 DIAGNOSIS — J439 Emphysema, unspecified: Secondary | ICD-10-CM | POA: Diagnosis not present

## 2017-05-29 DIAGNOSIS — E119 Type 2 diabetes mellitus without complications: Secondary | ICD-10-CM | POA: Diagnosis not present

## 2017-05-29 DIAGNOSIS — H9193 Unspecified hearing loss, bilateral: Secondary | ICD-10-CM

## 2017-05-29 LAB — BAYER DCA HB A1C WAIVED: HB A1C (BAYER DCA - WAIVED): 5.9 % (ref <7.0)

## 2017-05-29 MED ORDER — LISINOPRIL 5 MG PO TABS: 5.0000 mg | ORAL_TABLET | Freq: Every day | ORAL | 1 refills | Status: DC

## 2017-05-29 MED ORDER — TIOTROPIUM BROMIDE MONOHYDRATE 18 MCG IN CAPS: 18.0000 ug | ORAL_CAPSULE | Freq: Every day | RESPIRATORY_TRACT | 1 refills | Status: DC

## 2017-05-29 MED ORDER — FLUTICASONE FUROATE-VILANTEROL 100-25 MCG/INH IN AEPB: 1.0000 | INHALATION_SPRAY | Freq: Every day | RESPIRATORY_TRACT | 1 refills | Status: DC

## 2017-05-29 NOTE — Progress Notes (Signed)
BP 126/72   Pulse 87   Temp (!) 96.9 F (36.1 C) (Oral)   Ht 6' (1.829 m)   Wt 178 lb 6 oz (80.9 kg)   BMI 24.19 kg/m    Subjective:    Patient ID: NEPHTALI DOCKEN, male    DOB: Jul 24, 1946, 71 y.o.   MRN: [id number]  HPI: ARRAN FESSEL is a 71 y.o. male presenting on 05/29/2017 for Diabetes (followup; Dr. Sabra Heck pt); Hyperlipidemia; Hypertension; and Referrals (podiatrist, audiologist for hearing test)   HPI Type 2 diabetes mellitus Patient comes in today for recheck of his diabetes. Patient has been currently taking Januvia and metformin and glipizide 2.5 mg. Patient is currently on an ACE inhibitor. Patient has not seen an ophthalmologist this year. Patient denies any issues with his feet sensation but does have thickened and yellowed toenails and wants to go see a podiatrist for this.   Hypertension Patient is currently on metoprolol and lisinopril, and her blood pressure today is 126/72. Patient denies any lightheadedness or dizziness. Patient denies headaches, blurred vision, chest pains, shortness of breath, or weakness. Denies any side effects from medication and is content with current medication.   Hyperlipidemia Patient is coming in for recheck of his hyperlipidemia. He is currently taking Lipitor, stopped taking it though because of muscle aches although he was doing physical therapy, will try and restart it at every other day now. He denies any issues with myalgias or history of liver damage from it. He denies any focal numbness or weakness or chest pain.   COPD recheck Patient is coming in for his COPD recheck today, he says he has not used his albuterol inhaler at all. He has been doing very well with his breathing and Spiriva. He said he got over the pneumonia that he had a few months ago. He denies any shortness of breath or wheezing spells, does not hold by coughing but has not had any coughing fits. His cough is mostly dry at this point.  Patient complains of hearing  changes and would like to go see if he needs hearing aids with an audiologist.  Relevant past medical, surgical, family and social history reviewed and updated as indicated. Interim medical history since our last visit reviewed. Allergies and medications reviewed and updated.  Review of Systems  Constitutional: Negative for chills and fever.  HENT: Positive for hearing loss.   Eyes: Negative for discharge and visual disturbance.  Respiratory: Positive for cough. Negative for shortness of breath and wheezing.   Cardiovascular: Negative for chest pain and leg swelling.  Musculoskeletal: Negative for back pain and gait problem.  Skin: Negative for rash.  All other systems reviewed and are negative.   Per HPI unless specifically indicated above     Objective:    BP 126/72   Pulse 87   Temp (!) 96.9 F (36.1 C) (Oral)   Ht 6' (1.829 m)   Wt 178 lb 6 oz (80.9 kg)   BMI 24.19 kg/m   Wt Readings from Last 3 Encounters:  05/29/17 178 lb 6 oz (80.9 kg)  04/21/17 180 lb (81.6 kg)  01/26/17 185 lb 6.4 oz (84.1 kg)    Physical Exam  Constitutional: He is oriented to person, place, and time. He appears well-developed and well-nourished. No distress.  Eyes: Conjunctivae are normal. No scleral icterus.  Neck: Neck supple. No thyromegaly present.  Cardiovascular: Normal rate, regular rhythm, normal heart sounds and intact distal pulses.   No murmur heard. Pulmonary/Chest:  Effort normal. No respiratory distress. He has no wheezes. He has rhonchi in the right upper field and the left upper field. He has no rales.  Musculoskeletal: Normal range of motion. He exhibits no edema.  Lymphadenopathy:    He has no cervical adenopathy.  Neurological: He is alert and oriented to person, place, and time. Coordination normal.  Skin: Skin is warm and dry. No rash noted. He is not diaphoretic.  Psychiatric: He has a normal mood and affect. His behavior is normal.  Nursing note and vitals  reviewed.   Results for orders placed or performed in visit on 09/20/16  Bayer DCA Hb A1c Waived  Result Value Ref Range   Bayer DCA Hb A1c Waived 6.3 <7.0 %      Assessment & Plan:   Problem List Items Addressed This Visit      Cardiovascular and Mediastinum   Hypertension   Relevant Medications   lisinopril (PRINIVIL,ZESTRIL) 5 MG tablet   Other Relevant Orders   CBC with Differential/Platelet   Microalbumin / creatinine urine ratio     Respiratory   COPD (chronic obstructive pulmonary disease) (HCC)   Relevant Medications   tiotropium (SPIRIVA) 18 MCG inhalation capsule   fluticasone furoate-vilanterol (BREO ELLIPTA) 100-25 MCG/INH AEPB     Endocrine   Diabetes mellitus without complication (HCC)   Relevant Medications   lisinopril (PRINIVIL,ZESTRIL) 5 MG tablet   Other Relevant Orders   Bayer DCA Hb A1c Waived   CMP14+EGFR   Microalbumin / creatinine urine ratio     Other   Hyperlipidemia - Primary   Relevant Medications   lisinopril (PRINIVIL,ZESTRIL) 5 MG tablet   Other Relevant Orders   Lipid panel    Other Visit Diagnoses    Need for hepatitis C screening test       Relevant Orders   Hepatitis C antibody      Follow up plan: Return in about 3 months (around 08/29/2017), or if symptoms worsen or fail to improve, for Recheck diabetes.  Counseling provided for all of the vaccine components Orders Placed This Encounter  Procedures  . Bayer DCA Hb A1c Waived  . CMP14+EGFR  . Lipid panel  . CBC with Differential/Platelet  . Microalbumin / creatinine urine ratio  . Hepatitis C antibody    Caryl Pina, MD [facility] 05/29/2017, 10:25 AM

## 2017-05-30 ENCOUNTER — Other Ambulatory Visit: Payer: Medicare Other

## 2017-05-30 DIAGNOSIS — I10 Essential (primary) hypertension: Secondary | ICD-10-CM | POA: Diagnosis not present

## 2017-05-30 DIAGNOSIS — E119 Type 2 diabetes mellitus without complications: Secondary | ICD-10-CM | POA: Diagnosis not present

## 2017-05-30 LAB — CMP14+EGFR
ALT: 22 IU/L (ref 0–44)
AST: 21 IU/L (ref 0–40)
Albumin/Globulin Ratio: 1.2 (ref 1.2–2.2)
Albumin: 3.9 g/dL (ref 3.5–4.8)
Alkaline Phosphatase: 65 IU/L (ref 39–117)
BUN/Creatinine Ratio: 11 (ref 10–24)
BUN: 10 mg/dL (ref 8–27)
Bilirubin Total: 0.6 mg/dL (ref 0.0–1.2)
CO2: 25 mmol/L (ref 20–29)
Calcium: 9.3 mg/dL (ref 8.6–10.2)
Chloride: 93 mmol/L — ABNORMAL LOW (ref 96–106)
Creatinine, Ser: 0.92 mg/dL (ref 0.76–1.27)
GFR calc Af Amer: 96 mL/min/{1.73_m2} (ref >59)
GFR calc non Af Amer: 83 mL/min/{1.73_m2} (ref >59)
Globulin, Total: 3.2 g/dL (ref 1.5–4.5)
Glucose: 144 mg/dL — ABNORMAL HIGH (ref 65–99)
Potassium: 4.7 mmol/L (ref 3.5–5.2)
Sodium: 132 mmol/L — ABNORMAL LOW (ref 134–144)
Total Protein: 7.1 g/dL (ref 6.0–8.5)

## 2017-05-30 LAB — CBC WITH DIFFERENTIAL/PLATELET
Basophils Absolute: 0 10*3/uL (ref 0.0–0.2)
Basos: 0 %
EOS (ABSOLUTE): 0.2 10*3/uL (ref 0.0–0.4)
Eos: 3 %
Hematocrit: 36.3 % — ABNORMAL LOW (ref 37.5–51.0)
Hemoglobin: 12 g/dL — ABNORMAL LOW (ref 13.0–17.7)
Immature Grans (Abs): 0 10*3/uL (ref 0.0–0.1)
Immature Granulocytes: 0 %
Lymphocytes Absolute: 1.7 10*3/uL (ref 0.7–3.1)
Lymphs: 24 %
MCH: 32.2 pg (ref 26.6–33.0)
MCHC: 33.1 g/dL (ref 31.5–35.7)
MCV: 97 fL (ref 79–97)
Monocytes Absolute: 1 10*3/uL — ABNORMAL HIGH (ref 0.1–0.9)
Monocytes: 15 %
Neutrophils Absolute: 4 10*3/uL (ref 1.4–7.0)
Neutrophils: 58 %
Platelets: 239 10*3/uL (ref 150–379)
RBC: 3.73 x10E6/uL — ABNORMAL LOW (ref 4.14–5.80)
RDW: 15.8 % — ABNORMAL HIGH (ref 12.3–15.4)
WBC: 7 10*3/uL (ref 3.4–10.8)

## 2017-05-30 LAB — LIPID PANEL
Chol/HDL Ratio: 4.9 ratio (ref 0.0–5.0)
Cholesterol, Total: 151 mg/dL (ref 100–199)
HDL: 31 mg/dL — ABNORMAL LOW (ref >39)
LDL Calculated: 93 mg/dL (ref 0–99)
Triglycerides: 137 mg/dL (ref 0–149)
VLDL Cholesterol Cal: 27 mg/dL (ref 5–40)

## 2017-05-30 LAB — HEPATITIS C ANTIBODY: Hep C Virus Ab: <0.1 s/co ratio (ref 0.0–0.9)

## 2017-05-31 LAB — MICROALBUMIN / CREATININE URINE RATIO
Creatinine, Urine: 59 mg/dL
Microalb/Creat Ratio: <5.1 mg/g{creat} (ref 0.0–30.0)
Microalbumin, Urine: <3 ug/mL

## 2017-06-01 ENCOUNTER — Encounter: Payer: Self-pay | Admitting: *Deleted

## 2017-07-25 DIAGNOSIS — Z87891 Personal history of nicotine dependence: Secondary | ICD-10-CM | POA: Diagnosis not present

## 2017-07-25 DIAGNOSIS — R918 Other nonspecific abnormal finding of lung field: Secondary | ICD-10-CM | POA: Diagnosis not present

## 2017-07-25 DIAGNOSIS — J9601 Acute respiratory failure with hypoxia: Secondary | ICD-10-CM | POA: Diagnosis not present

## 2017-07-25 DIAGNOSIS — R6521 Severe sepsis with septic shock: Secondary | ICD-10-CM | POA: Diagnosis not present

## 2017-07-25 DIAGNOSIS — J13 Pneumonia due to Streptococcus pneumoniae: Secondary | ICD-10-CM | POA: Diagnosis not present

## 2017-07-25 DIAGNOSIS — J440 Chronic obstructive pulmonary disease with acute lower respiratory infection: Secondary | ICD-10-CM | POA: Diagnosis not present

## 2017-07-25 DIAGNOSIS — J449 Chronic obstructive pulmonary disease, unspecified: Secondary | ICD-10-CM | POA: Diagnosis not present

## 2017-07-27 ENCOUNTER — Other Ambulatory Visit: Payer: Self-pay | Admitting: Family Medicine

## 2017-08-08 ENCOUNTER — Other Ambulatory Visit: Payer: Self-pay | Admitting: Family Medicine

## 2017-08-29 ENCOUNTER — Encounter: Payer: Self-pay | Admitting: Family Medicine

## 2017-08-29 ENCOUNTER — Ambulatory Visit (INDEPENDENT_AMBULATORY_CARE_PROVIDER_SITE_OTHER): Payer: Medicare Other | Admitting: Family Medicine

## 2017-08-29 VITALS — BP 130/87 | HR 98 | Temp 97.4°F | Ht 72.0 in | Wt 176.0 lb

## 2017-08-29 DIAGNOSIS — E119 Type 2 diabetes mellitus without complications: Secondary | ICD-10-CM | POA: Diagnosis not present

## 2017-08-29 DIAGNOSIS — J439 Emphysema, unspecified: Secondary | ICD-10-CM

## 2017-08-29 DIAGNOSIS — I10 Essential (primary) hypertension: Secondary | ICD-10-CM

## 2017-08-29 DIAGNOSIS — E1159 Type 2 diabetes mellitus with other circulatory complications: Secondary | ICD-10-CM | POA: Diagnosis not present

## 2017-08-29 DIAGNOSIS — E785 Hyperlipidemia, unspecified: Secondary | ICD-10-CM | POA: Diagnosis not present

## 2017-08-29 DIAGNOSIS — I152 Hypertension secondary to endocrine disorders: Secondary | ICD-10-CM

## 2017-08-29 DIAGNOSIS — E1169 Type 2 diabetes mellitus with other specified complication: Secondary | ICD-10-CM

## 2017-08-29 LAB — BAYER DCA HB A1C WAIVED: HB A1C (BAYER DCA - WAIVED): 6 % (ref <7.0)

## 2017-08-29 MED ORDER — ATORVASTATIN CALCIUM 20 MG PO TABS: 20.0000 mg | ORAL_TABLET | Freq: Every day | ORAL | 1 refills | Status: DC

## 2017-08-29 MED ORDER — FUROSEMIDE 20 MG PO TABS: 20.0000 mg | ORAL_TABLET | Freq: Every day | ORAL | 1 refills | Status: DC

## 2017-08-29 MED ORDER — FLUTICASONE FUROATE-VILANTEROL 100-25 MCG/INH IN AEPB: 1.0000 | INHALATION_SPRAY | Freq: Every day | RESPIRATORY_TRACT | 1 refills | Status: DC

## 2017-08-29 MED ORDER — METFORMIN HCL 500 MG PO TABS: 500.0000 mg | ORAL_TABLET | Freq: Two times a day (BID) | ORAL | 1 refills | Status: DC

## 2017-08-29 MED ORDER — METOPROLOL TARTRATE 50 MG PO TABS: 50.0000 mg | ORAL_TABLET | Freq: Two times a day (BID) | ORAL | 1 refills | Status: DC

## 2017-08-29 MED ORDER — LISINOPRIL 5 MG PO TABS: 5.0000 mg | ORAL_TABLET | Freq: Every day | ORAL | 1 refills | Status: DC

## 2017-08-29 MED ORDER — GLIPIZIDE 5 MG PO TABS: 2.5000 mg | ORAL_TABLET | Freq: Two times a day (BID) | ORAL | 1 refills | Status: DC

## 2017-08-29 MED ORDER — TIOTROPIUM BROMIDE MONOHYDRATE 18 MCG IN CAPS: 18.0000 ug | ORAL_CAPSULE | Freq: Every day | RESPIRATORY_TRACT | 1 refills | Status: DC

## 2017-08-29 MED ORDER — SITAGLIPTIN PHOSPHATE 100 MG PO TABS: 100.0000 mg | ORAL_TABLET | Freq: Every day | ORAL | 1 refills | Status: DC

## 2017-08-29 NOTE — Progress Notes (Signed)
BP 130/87   Pulse 98   Temp (!) 97.4 F (36.3 C) (Oral)   Ht 6' (1.829 m)   Wt 176 lb (79.8 kg)   BMI 23.87 kg/m    Subjective:    Patient ID: Ray Carlson, male    DOB: Jul 04, 1946, 71 y.o.   MRN: [id number]  HPI: FIN HUPP is a 71 y.o. male presenting on 08/29/2017 for Diabetes (3 mo; patient is fasting); Hyperlipidemia; and Hypertension   HPI Type 2 diabetes mellitus Patient comes in today for recheck of his diabetes. Patient has been currently taking Metformin and Januvia and glipizide, his blood sugars have been running between 90 and 130 in the morning. Patient is currently on an ACE inhibitor/ARB. Patient has not seen an ophthalmologist this year. Patient denies any issues with their feet.   Hypertension Patient is currently on lisinopril, and their blood pressure today is 130/87. Patient denies any lightheadedness or dizziness. Patient denies headaches, blurred vision, chest pains, shortness of breath, or weakness. Denies any side effects from medication and is content with current medication.   Hyperlipidemia Patient is coming in for recheck of his hyperlipidemia. The patient is currently taking atorvastatin, he does not always take it as he is prescribed. They deny any issues with myalgias or history of liver damage from it. They deny any focal numbness or weakness or chest pain.   COPD recheck COPD and breathing recheck today. Patient is currently on Breo and Spiriva and says that they have been working very well for him. He says that he has not had any major coughing or wheezing spells. He still has his chronic cough but nothing more significant than that. He feels pretty well and feels like he is doing very well. He has not been as consistent with prerenal but has been using Spiriva pretty consistently.  Relevant past medical, surgical, family and social history reviewed and updated as indicated. Interim medical history since our last visit reviewed. Allergies and  medications reviewed and updated.  Review of Systems  Constitutional: Negative for chills and fever.  Eyes: Negative for discharge.  Respiratory: Positive for cough. Negative for chest tightness, shortness of breath and wheezing.   Cardiovascular: Negative for chest pain and leg swelling.  Musculoskeletal: Negative for back pain and gait problem.  Skin: Negative for rash.  Neurological: Negative for dizziness, weakness, light-headedness, numbness and headaches.  All other systems reviewed and are negative.   Per HPI unless specifically indicated above        Objective:    BP 130/87   Pulse 98   Temp (!) 97.4 F (36.3 C) (Oral)   Ht 6' (1.829 m)   Wt 176 lb (79.8 kg)   BMI 23.87 kg/m   Wt Readings from Last 3 Encounters:  08/29/17 176 lb (79.8 kg)  05/29/17 178 lb 6 oz (80.9 kg)  04/21/17 180 lb (81.6 kg)    Physical Exam  Constitutional: He is oriented to person, place, and time. He appears well-developed and well-nourished. No distress.  Eyes: Conjunctivae are normal. No scleral icterus.  Neck: Neck supple. No thyromegaly present.  Cardiovascular: Normal rate, regular rhythm, normal heart sounds and intact distal pulses.   No murmur heard. Pulmonary/Chest: Effort normal and breath sounds normal. No respiratory distress. He has no wheezes. He has no rales.  Musculoskeletal: Normal range of motion. He exhibits tenderness (1+ pitting). He exhibits no edema.  Lymphadenopathy:    He has no cervical adenopathy.  Neurological: He is  alert and oriented to person, place, and time. Coordination normal.  Skin: Skin is warm and dry. No rash noted. He is not diaphoretic.  Psychiatric: He has a normal mood and affect. His behavior is normal.  Nursing note and vitals reviewed.       Assessment & Plan:   Problem List Items Addressed This Visit      Cardiovascular and Mediastinum   Hypertension associated with diabetes (HCC)   Relevant Medications   furosemide (LASIX) 20 MG  tablet   glipiZIDE (GLUCOTROL) 5 MG tablet   sitaGLIPtin (JANUVIA) 100 MG tablet   lisinopril (PRINIVIL,ZESTRIL) 5 MG tablet   metFORMIN (GLUCOPHAGE) 500 MG tablet   metoprolol tartrate (LOPRESSOR) 50 MG tablet   atorvastatin (LIPITOR) 20 MG tablet     Respiratory   COPD (chronic obstructive pulmonary disease) (HCC)   Relevant Medications   tiotropium (SPIRIVA) 18 MCG inhalation capsule   fluticasone furoate-vilanterol (BREO ELLIPTA) 100-25 MCG/INH AEPB     Endocrine   Hyperlipidemia associated with type 2 diabetes mellitus (HCC)   Relevant Medications   furosemide (LASIX) 20 MG tablet   glipiZIDE (GLUCOTROL) 5 MG tablet   sitaGLIPtin (JANUVIA) 100 MG tablet   lisinopril (PRINIVIL,ZESTRIL) 5 MG tablet   metFORMIN (GLUCOPHAGE) 500 MG tablet   metoprolol tartrate (LOPRESSOR) 50 MG tablet   atorvastatin (LIPITOR) 20 MG tablet   Diabetes mellitus without complication (HCC) - Primary   Relevant Medications   glipiZIDE (GLUCOTROL) 5 MG tablet   sitaGLIPtin (JANUVIA) 100 MG tablet   lisinopril (PRINIVIL,ZESTRIL) 5 MG tablet   metFORMIN (GLUCOPHAGE) 500 MG tablet   atorvastatin (LIPITOR) 20 MG tablet   Other Relevant Orders   Bayer DCA Hb A1c Waived    Continue current medications for now, we'll recheck labs and see where he falls.   Follow up plan: Return in about 3 months (around 11/28/2017), or if symptoms worsen or fail to improve, for Recheck diabetes and hypertension.  Counseling provided for all of the vaccine components Orders Placed This Encounter  Procedures  . Bayer [facility] Hb A1c Waived    Arville Care, MD [facility] 08/29/2017, 10:22 AM

## 2017-09-06 ENCOUNTER — Ambulatory Visit (INDEPENDENT_AMBULATORY_CARE_PROVIDER_SITE_OTHER): Payer: Medicare Other

## 2017-09-06 DIAGNOSIS — Z23 Encounter for immunization: Secondary | ICD-10-CM

## 2017-10-09 DIAGNOSIS — E119 Type 2 diabetes mellitus without complications: Secondary | ICD-10-CM | POA: Diagnosis not present

## 2017-10-09 DIAGNOSIS — H40033 Anatomical narrow angle, bilateral: Secondary | ICD-10-CM | POA: Diagnosis not present

## 2017-10-11 ENCOUNTER — Ambulatory Visit: Payer: Medicare Other | Admitting: *Deleted

## 2017-10-23 ENCOUNTER — Encounter: Payer: Self-pay | Admitting: *Deleted

## 2017-10-24 ENCOUNTER — Encounter: Payer: Self-pay | Admitting: *Deleted

## 2017-10-24 ENCOUNTER — Ambulatory Visit (INDEPENDENT_AMBULATORY_CARE_PROVIDER_SITE_OTHER): Payer: Medicare Other | Admitting: *Deleted

## 2017-10-24 VITALS — BP 132/84 | HR 75 | Ht 69.0 in | Wt 173.0 lb

## 2017-10-24 DIAGNOSIS — Z0000 Encounter for general adult medical examination without abnormal findings: Secondary | ICD-10-CM | POA: Diagnosis not present

## 2017-10-24 NOTE — Patient Instructions (Addendum)
  Mr. Ray Carlson , Thank you for taking time to come for your Medicare Wellness Visit. I appreciate your ongoing commitment to your health goals. Please review the following plan we discussed and let me know if I can assist you in the future.   These are the goals we discussed: Continue to stay active. Aim for at least 150 minutes of physical activity a week.  Move carefully to avoid falls.   This is a list of the screening recommended for you and due dates:  Health Maintenance  Topic Date Due  . Eye exam for diabetics  11/28/2014  . Tetanus Vaccine  12/12/2017  . Colon Cancer Screening  02/09/2018  . Hemoglobin A1C  02/26/2018  . Complete foot exam   05/29/2018  . Flu Shot  Completed  .  Hepatitis C: One time screening is recommended by Center for Disease Control  (CDC) for  adults born from [birth year] through 1965.   Completed  . Pneumonia vaccines  Completed

## 2017-10-25 NOTE — Progress Notes (Signed)
Subjective:   Ray Carlson is a 71 y.o. male who presents for an Initial Medicare Annual Wellness Visit. Mr Seitzinger has been divorced for 40 years. He lives at home alone. He has 2 sons that live close by, 5 grandchildren, and 2 great grandchildren. He is especially to close to his 39 year old great grandson. He comes over to his house frequently. He is retired from the [employer].   Review of Systems  Reports that his health is worse than last year but improving.   Cardiac Risk Factors include: advanced age (>44men, >33 women);diabetes mellitus;dyslipidemia;hypertension;male gender;smoking/ tobacco exposure;sedentary lifestyle  Neurology: Difficulty walking. Legs "don't work right". No pain or numbness, just difficulty with coordinated movement.     Other systems negative today.   Objective:    Today's Vitals   10/25/17 0857  BP: 132/84  Pulse: 75  Weight: 173 lb (78.5 kg)  Height: 5\' 9"  (1.753 m)   Body mass index is 25.55 kg/m.  Current Medications (verified) Outpatient Encounter Medications as of 10/24/2017  Medication Sig  . atorvastatin (LIPITOR) 20 MG tablet Take 1 tablet (20 mg total) by mouth daily.  . Cholecalciferol (VITAMIN D) 2000 UNITS CAPS Take 1 capsule (2,000 Units total) by mouth daily.  . Cyanocobalamin (VITAMIN B 12 PO) Take 1,000 mg by mouth daily.  . fluticasone furoate-vilanterol (BREO ELLIPTA) 100-25 MCG/INH AEPB Inhale 1 puff into the lungs daily.  . furosemide (LASIX) 20 MG tablet Take 1 tablet (20 mg total) by mouth daily.  Marland Kitchen glipiZIDE (GLUCOTROL) 5 MG tablet Take 0.5 tablets (2.5 mg total) by mouth 2 (two) times daily before a meal.  . glucose blood (ACCU-CHEK AVIVA PLUS) test strip Test bid. E11.9  . lisinopril (PRINIVIL,ZESTRIL) 5 MG tablet Take 1 tablet (5 mg total) by mouth daily.  . metFORMIN (GLUCOPHAGE) 500 MG tablet Take 1 tablet (500 mg total) by mouth 2 (two) times daily.  . metoprolol tartrate (LOPRESSOR) 50 MG tablet Take 1 tablet (50  mg total) by mouth 2 (two) times daily.  . sitaGLIPtin (JANUVIA) 100 MG tablet Take 1 tablet (100 mg total) by mouth daily.  Marland Kitchen tiotropium (SPIRIVA) 18 MCG inhalation capsule Place 1 capsule (18 mcg total) into inhaler and inhale daily.   No facility-administered encounter medications on file as of 10/24/2017.     Allergies (verified) Penicillins   History: Past Medical History:  Diagnosis Date  . Diabetes mellitus without complication (HCC)   . Hyperlipidemia   . Hypertension   . Tobacco user   . Vitamin D deficiency    Past Surgical History:  Procedure Laterality Date  . CATARACT EXTRACTION     Family History  Problem Relation Age of Onset  . Hypertension Mother   . Heart disease Mother   . Diabetes Mother   . Heart failure Mother   . Hypertension Father   . Leukemia Father   . Diabetes Sister   . Diabetes Brother   . Healthy Son   . Diabetes Brother   . Diabetes Brother   . Diabetes Sister   . Healthy Son    Social History   Occupational History  . Occupation: Retired    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  Tobacco Use  . Smoking status: Former Smoker    Packs/day: 0.25    Types: Cigarettes    Last attempt to quit: 01/01/2016    Years since quitting: 1.8  . Smokeless tobacco: Never Used  Substance and Sexual Activity  .  Alcohol use: No  . Drug use: No  . Sexual activity: Not Currently   Tobacco Counseling No tobacco use. Quite years ago.   Activities of Daily Living In your present state of health, do you have any difficulty performing the following activities: 10/24/2017  Hearing? Y  Comment Has not been evaluated. Interested in having exam  Vision? N  Comment eye exam 2 weeks ago. Despina AriasYen Le, OD  Difficulty concentrating or making decisions? N  Comment no problem with memory  Walking or climbing stairs? Y  Comment Has difficulty with steps due to balance. Does not have stairs at home. '  Dressing or bathing? N  Doing errands, shopping? N    Preparing Food and eating ? N  Using the Toilet? N  In the past six months, have you accidently leaked urine? N  Do you have problems with loss of bowel control? N  Managing your Medications? N  Comment uses pill box  Managing your Finances? N  Housekeeping or managing your Housekeeping? N  Some recent data might be hidden    Immunizations and Health Maintenance Immunization History  Administered Date(s) Administered  . Influenza, High Dose Seasonal PF 09/30/2015, 09/06/2017  . Influenza,inj,Quad PF,6+ Mos 10/14/2014, 09/20/2016  . Influenza-Unspecified 10/12/2013  . Pneumococcal Conjugate-13 09/01/2015  . Pneumococcal Polysaccharide-23 08/12/2010, 12/03/2013  . Tdap 12/13/2007  . Zoster 08/04/2014   Health Maintenance Due  Topic Date Due  . OPHTHALMOLOGY EXAM  11/28/2014    Patient Care Team: Dettinger, Elige RadonJoshua A, MD as PCP - General (Family Medicine) Love, Genene ChurnJames M, MD as Consulting Physician (Neurology) Bakhru, Iverson Alaminita Nanik, MD as Referring Physician (Pulmonary Disease)  Despina AriasLe, Yen, OD as optometrist  ER visit and hospitalization due to pneumonia in 03/2017. No surgeries this past year.     Assessment:   This is a routine wellness examination for Ray Carlson.   Hearing/Vision screen Some hearing deficit noted during visit. Eye exam is overdue  Dietary issues and exercise activities discussed: Current Exercise Habits: The patient does not participate in regular exercise at present(Patient stays busy around his home doing housework but doesn't exercise), Exercise limited by: orthopedic condition(s);respiratory conditions(s)  Goals 150 minutes of moderate physical activity a week  Depression Screen PHQ 2/9 Scores 10/24/2017 08/29/2017 05/29/2017 04/21/2017  PHQ - 2 Score 0 0 0 0    Fall Risk Fall Risk  10/24/2017 08/29/2017 05/29/2017 04/21/2017 05/19/2016  Falls in the past year? No No No No No  Comment Uses cane around the house and a walker out in public - - - -  Risk for fall  due to : - - - - -    Cognitive Function: MMSE - Mini Mental State Exam 10/24/2017  Orientation to time 5  Orientation to Place 5  Registration 3  Attention/ Calculation 0  Attention/Calculation-comments not attempted  Recall 2  Language- name 2 objects 2  Language- repeat 1  Language- follow 3 step command 3  Language- read & follow direction 1  Write a sentence 0  Write a sentence-comments not attempted  Copy design 0  Total score 22  Unable to complete      Screening Tests Health Maintenance  Topic Date Due  . OPHTHALMOLOGY EXAM  11/28/2014  . TETANUS/TDAP  12/12/2017  . COLONOSCOPY  02/09/2018  . HEMOGLOBIN A1C  02/26/2018  . FOOT EXAM  05/29/2018  . INFLUENZA VACCINE  Completed  . Hepatitis C Screening  Completed  . PNA vac Low Risk Adult  Completed  Plan:  Keep f/u with PCP Move carefully to avoid falls Schedule eye exam. Have them send a copy to our office.    I have personally reviewed and noted the following in the patient's chart:   . Medical and social history . Use of alcohol, tobacco or illicit drugs  . Current medications and supplements . Functional ability and status . Nutritional status . Physical activity . Advanced directives . List of other physicians . Hospitalizations, surgeries, and ER visits in previous 12 months . Vitals . Screenings to include cognitive, depression, and falls . Referrals and appointments  In addition, I have reviewed and discussed with patient certain preventive protocols, quality metrics, and best practice recommendations. A written personalized care plan for preventive services as well as general preventive health recommendations were provided to patient.    Demetrios LollKristen Jordy Verba, RN   10/25/2017

## 2017-11-29 ENCOUNTER — Ambulatory Visit: Payer: Medicare Other | Admitting: Family Medicine

## 2017-11-29 ENCOUNTER — Encounter: Payer: Self-pay | Admitting: Family Medicine

## 2017-11-29 VITALS — BP 128/77 | HR 90 | Temp 97.8°F | Ht 69.0 in | Wt 184.0 lb

## 2017-11-29 DIAGNOSIS — J439 Emphysema, unspecified: Secondary | ICD-10-CM

## 2017-11-29 DIAGNOSIS — E119 Type 2 diabetes mellitus without complications: Secondary | ICD-10-CM

## 2017-11-29 DIAGNOSIS — E785 Hyperlipidemia, unspecified: Secondary | ICD-10-CM | POA: Diagnosis not present

## 2017-11-29 DIAGNOSIS — I152 Hypertension secondary to endocrine disorders: Secondary | ICD-10-CM

## 2017-11-29 DIAGNOSIS — E1159 Type 2 diabetes mellitus with other circulatory complications: Secondary | ICD-10-CM | POA: Diagnosis not present

## 2017-11-29 DIAGNOSIS — E1169 Type 2 diabetes mellitus with other specified complication: Secondary | ICD-10-CM | POA: Diagnosis not present

## 2017-11-29 DIAGNOSIS — I10 Essential (primary) hypertension: Secondary | ICD-10-CM | POA: Diagnosis not present

## 2017-11-29 LAB — BAYER DCA HB A1C WAIVED: HB A1C (BAYER DCA - WAIVED): 5.9 % (ref <7.0)

## 2017-11-29 NOTE — Progress Notes (Signed)
BP 128/77   Pulse 90   Temp 97.8 F (36.6 C) (Oral)   Ht 5' 9" (1.753 m)   Wt 184 lb (83.5 kg)   BMI 27.17 kg/m    Subjective:    Patient ID: Ray Carlson, male    DOB: [birth year], 71 y.o.   MRN: [id number]  HPI: Ray Carlson is a 71 y.o. male presenting on 11/29/2017 for Diabetes (3 mo); Hyperlipidemia; and Hypertension   HPI Type 2 diabetes mellitus Patient comes in today for recheck of his diabetes. Patient has been currently taking glipizide and metformin and Januvia. Patient is currently on an ACE inhibitor/ARB. Patient has not seen an ophthalmologist this year. Patient denies any issues with their feet.   Hyperlipidemia Patient is coming in for recheck of his hyperlipidemia. The patient is currently taking Lipitor. They deny any issues with myalgias or history of liver damage from it. They deny any focal numbness or weakness or chest pain.   Hypertension Patient is currently on lisinopril and metoprolol, and their blood pressure today is 128/77. Patient denies any lightheadedness or dizziness. Patient denies headaches, blurred vision, chest pains, shortness of breath, or weakness. Denies any side effects from medication and is content with current medication.   COPD recheck Patient is coming in for a COPD recheck today and says he is doing very well and has his inhalers and denies any shortness of breath or wheezing.  He still has a chronic cough but he is very happy with where he is at currently.  Relevant past medical, surgical, family and social history reviewed and updated as indicated. Interim medical history since our last visit reviewed. Allergies and medications reviewed and updated.  Review of Systems  Constitutional: Negative for chills and fever.  Respiratory: Negative for shortness of breath and wheezing.   Cardiovascular: Negative for chest pain and leg swelling.  Musculoskeletal: Negative for back pain and gait problem.  Skin: Negative for rash.    Neurological: Negative for dizziness, weakness, light-headedness, numbness and headaches.  All other systems reviewed and are negative.   Per HPI unless specifically indicated above     Objective:    BP 128/77   Pulse 90   Temp 97.8 F (36.6 C) (Oral)   Ht 5' 9" (1.753 m)   Wt 184 lb (83.5 kg)   BMI 27.17 kg/m   Wt Readings from Last 3 Encounters:  11/29/17 184 lb (83.5 kg)  10/25/17 173 lb (78.5 kg)  08/29/17 176 lb (79.8 kg)    Physical Exam  Constitutional: He is oriented to person, place, and time. He appears well-developed and well-nourished. No distress.  Eyes: Conjunctivae are normal. No scleral icterus.  Neck: Neck supple. No thyromegaly present.  Cardiovascular: Normal rate, regular rhythm, normal heart sounds and intact distal pulses.  No murmur heard. Pulmonary/Chest: Effort normal and breath sounds normal. No respiratory distress. He has no wheezes.  Musculoskeletal: Normal range of motion. He exhibits no edema.  Lymphadenopathy:    He has no cervical adenopathy.  Neurological: He is alert and oriented to person, place, and time. Coordination normal.  Skin: Skin is warm and dry. No rash noted. He is not diaphoretic.  Psychiatric: He has a normal mood and affect. His behavior is normal.  Nursing note and vitals reviewed.     Assessment & Plan:   Problem List Items Addressed This Visit      Cardiovascular and Mediastinum   Hypertension associated with diabetes ([place]) - Primary  Relevant Orders   CMP14+EGFR     Respiratory   COPD (chronic obstructive pulmonary disease) ([place])   Relevant Orders   CBC with Differential/Platelet     Endocrine   Hyperlipidemia associated with type 2 diabetes mellitus ([place])   Relevant Orders   Lipid panel   Diabetes mellitus without complication ([place])   Relevant Orders   Bayer DCA Hb A1c Waived   CMP14+EGFR       Follow up plan: Return in about 3 months (around 02/27/2018), or if symptoms worsen or fail to improve,  for Diabetes recheck.  Counseling provided for all of the vaccine components Orders Placed This Encounter  Procedures  . Bayer DCA Hb A1c Waived  . CMP14+EGFR  . CBC with Differential/Platelet  . Lipid panel    Caryl Pina, MD [facility] 11/29/2017, 9:14 AM

## 2017-11-30 LAB — CBC WITH DIFFERENTIAL/PLATELET
Basophils Absolute: 0 10*3/uL (ref 0.0–0.2)
Basos: 0 %
EOS (ABSOLUTE): 0.3 10*3/uL (ref 0.0–0.4)
Eos: 4 %
Hematocrit: 39.9 % (ref 37.5–51.0)
Hemoglobin: 13.8 g/dL (ref 13.0–17.7)
Immature Grans (Abs): 0 10*3/uL (ref 0.0–0.1)
Immature Granulocytes: 0 %
Lymphocytes Absolute: 1.5 10*3/uL (ref 0.7–3.1)
Lymphs: 22 %
MCH: 31.5 pg (ref 26.6–33.0)
MCHC: 34.6 g/dL (ref 31.5–35.7)
MCV: 91 fL (ref 79–97)
Monocytes Absolute: 0.8 10*3/uL (ref 0.1–0.9)
Monocytes: 12 %
Neutrophils Absolute: 4.1 10*3/uL (ref 1.4–7.0)
Neutrophils: 62 %
Platelets: 169 10*3/uL (ref 150–379)
RBC: 4.38 x10E6/uL (ref 4.14–5.80)
RDW: 16 % — ABNORMAL HIGH (ref 12.3–15.4)
WBC: 6.7 10*3/uL (ref 3.4–10.8)

## 2017-11-30 LAB — LIPID PANEL
Chol/HDL Ratio: 4.1 ratio (ref 0.0–5.0)
Cholesterol, Total: 143 mg/dL (ref 100–199)
HDL: 35 mg/dL — ABNORMAL LOW (ref >39)
LDL Calculated: 63 mg/dL (ref 0–99)
Triglycerides: 223 mg/dL — ABNORMAL HIGH (ref 0–149)
VLDL Cholesterol Cal: 45 mg/dL — ABNORMAL HIGH (ref 5–40)

## 2017-11-30 LAB — CMP14+EGFR
ALT: 23 IU/L (ref 0–44)
AST: 16 IU/L (ref 0–40)
Albumin/Globulin Ratio: 1.4 (ref 1.2–2.2)
Albumin: 4.2 g/dL (ref 3.5–4.8)
Alkaline Phosphatase: 74 IU/L (ref 39–117)
BUN/Creatinine Ratio: 16 (ref 10–24)
BUN: 15 mg/dL (ref 8–27)
Bilirubin Total: 0.4 mg/dL (ref 0.0–1.2)
CO2: 20 mmol/L (ref 20–29)
Calcium: 9.2 mg/dL (ref 8.6–10.2)
Chloride: 97 mmol/L (ref 96–106)
Creatinine, Ser: 0.92 mg/dL (ref 0.76–1.27)
GFR calc Af Amer: 96 mL/min/{1.73_m2} (ref >59)
GFR calc non Af Amer: 83 mL/min/{1.73_m2} (ref >59)
Globulin, Total: 3 g/dL (ref 1.5–4.5)
Glucose: 88 mg/dL (ref 65–99)
Potassium: 4.7 mmol/L (ref 3.5–5.2)
Sodium: 135 mmol/L (ref 134–144)
Total Protein: 7.2 g/dL (ref 6.0–8.5)

## 2018-02-09 ENCOUNTER — Other Ambulatory Visit: Payer: Self-pay | Admitting: Family Medicine

## 2018-02-09 DIAGNOSIS — I10 Essential (primary) hypertension: Principal | ICD-10-CM

## 2018-02-09 DIAGNOSIS — E1159 Type 2 diabetes mellitus with other circulatory complications: Secondary | ICD-10-CM

## 2018-02-09 DIAGNOSIS — E119 Type 2 diabetes mellitus without complications: Secondary | ICD-10-CM

## 2018-03-02 ENCOUNTER — Encounter: Payer: Self-pay | Admitting: Family Medicine

## 2018-03-02 ENCOUNTER — Ambulatory Visit: Payer: Medicare Other | Admitting: Family Medicine

## 2018-03-02 VITALS — BP 154/83 | HR 84 | Temp 97.3°F | Ht 69.0 in | Wt 194.0 lb

## 2018-03-02 DIAGNOSIS — J439 Emphysema, unspecified: Secondary | ICD-10-CM

## 2018-03-02 DIAGNOSIS — E1159 Type 2 diabetes mellitus with other circulatory complications: Secondary | ICD-10-CM | POA: Diagnosis not present

## 2018-03-02 DIAGNOSIS — E785 Hyperlipidemia, unspecified: Secondary | ICD-10-CM | POA: Diagnosis not present

## 2018-03-02 DIAGNOSIS — I10 Essential (primary) hypertension: Secondary | ICD-10-CM | POA: Diagnosis not present

## 2018-03-02 DIAGNOSIS — E119 Type 2 diabetes mellitus without complications: Secondary | ICD-10-CM | POA: Diagnosis not present

## 2018-03-02 DIAGNOSIS — E1169 Type 2 diabetes mellitus with other specified complication: Secondary | ICD-10-CM

## 2018-03-02 DIAGNOSIS — Z720 Tobacco use: Secondary | ICD-10-CM | POA: Diagnosis not present

## 2018-03-02 DIAGNOSIS — Z23 Encounter for immunization: Secondary | ICD-10-CM

## 2018-03-02 LAB — BAYER DCA HB A1C WAIVED: HB A1C (BAYER DCA - WAIVED): 6.4 % (ref <7.0)

## 2018-03-02 MED ORDER — SITAGLIPTIN PHOSPHATE 100 MG PO TABS: 100.0000 mg | ORAL_TABLET | Freq: Every day | ORAL | 1 refills | Status: DC

## 2018-03-02 MED ORDER — FLUTICASONE FUROATE-VILANTEROL 100-25 MCG/INH IN AEPB: 1.0000 | INHALATION_SPRAY | Freq: Every day | RESPIRATORY_TRACT | 4 refills | Status: DC

## 2018-03-02 MED ORDER — LISINOPRIL 5 MG PO TABS: 5.0000 mg | ORAL_TABLET | Freq: Every day | ORAL | 1 refills | Status: DC

## 2018-03-02 MED ORDER — METFORMIN HCL 500 MG PO TABS: 500.0000 mg | ORAL_TABLET | Freq: Two times a day (BID) | ORAL | 1 refills | Status: DC

## 2018-03-02 MED ORDER — ATORVASTATIN CALCIUM 20 MG PO TABS: 20.0000 mg | ORAL_TABLET | Freq: Every day | ORAL | 1 refills | Status: DC

## 2018-03-02 MED ORDER — FUROSEMIDE 20 MG PO TABS: 20.0000 mg | ORAL_TABLET | Freq: Every day | ORAL | 1 refills | Status: DC

## 2018-03-02 MED ORDER — TIOTROPIUM BROMIDE MONOHYDRATE 18 MCG IN CAPS: 18.0000 ug | ORAL_CAPSULE | Freq: Every day | RESPIRATORY_TRACT | 1 refills | Status: DC

## 2018-03-02 MED ORDER — METOPROLOL TARTRATE 50 MG PO TABS: 50.0000 mg | ORAL_TABLET | Freq: Two times a day (BID) | ORAL | 1 refills | Status: DC

## 2018-03-02 NOTE — Progress Notes (Signed)
BP (!) 154/83   Pulse 84   Temp (!) 97.3 F (36.3 C) (Oral)   Ht 5\' 9"  (1.753 m)   Wt 194 lb (88 kg)   BMI 28.65 kg/m    Subjective:    Patient ID: Ray Carlson, male    DOB: August 01, 1946, 72 y.o.   MRN: [id number]  HPI: Ray Carlson is a 72 y.o. male presenting on 03/02/2018 for Diabetes (3 mo); Hyperlipidemia; Hypertension; and COPD   HPI Hyperlipidemia Patient is coming in for recheck of his hyperlipidemia. The patient is currently taking Lipitor. They deny any issues with myalgias or history of liver damage from it. They deny any focal numbness or weakness or chest pain.   Hypertension Patient is currently on lisinopril and metoprolol, and their blood pressure today is 154/83, we are permitting him to run a little bit high to prevent hypotension and falls. Patient denies any lightheadedness or dizziness. Patient denies headaches, blurred vision, chest pains, shortness of breath, or weakness. Denies any side effects from medication and is content with current medication.   Type 2 diabetes mellitus Patient comes in today for recheck of his diabetes. Patient has been currently taking glipizide and metformin and Januvia. Patient is currently on an ACE inhibitor/ARB. Patient has seen an ophthalmologist this year, saw [facility] in January of this year. Patient denies any issues with their feet.   COPD Patient is coming in for COPD recheck today.  He is currently on Spiriva and Brio.  He has a mild chronic cough but denies any major coughing spells or wheezing spells.  He has 1nighttime symptoms per week and 2daytime symptoms per week currently.  Patient is still smoking.  He feels like he overall is doing very well right now  Relevant past medical, surgical, family and social history reviewed and updated as indicated. Interim medical history since our last visit reviewed. Allergies and medications reviewed and updated.  Review of Systems  Constitutional: Negative for chills and  fever.  HENT: Negative for congestion.   Eyes: Negative for discharge.  Respiratory: Positive for cough and wheezing. Negative for shortness of breath.   Cardiovascular: Negative for chest pain and leg swelling.  Musculoskeletal: Negative for back pain and gait problem.  Skin: Negative for rash.  Neurological: Positive for weakness (Generalized weakness, patient uses a walker). Negative for dizziness and numbness.  All other systems reviewed and are negative.   Per HPI unless specifically indicated above   Allergies as of 03/02/2018      Reactions   Penicillins       Medication List        Accurate as of 03/02/18  8:52 AM. Always use your most recent med list.          atorvastatin 20 MG tablet Commonly known as:  LIPITOR Take 1 tablet (20 mg total) by mouth daily.   fluticasone furoate-vilanterol 100-25 MCG/INH Aepb Commonly known as:  BREO ELLIPTA Inhale 1 puff into the lungs daily.   furosemide 20 MG tablet Commonly known as:  LASIX Take 1 tablet (20 mg total) by mouth daily.   glipiZIDE 5 MG tablet Commonly known as:  GLUCOTROL TAKE 1/2 TABLET TWICE DAILY BEFORE A MEAL.   glucose blood test strip Commonly known as:  ACCU-CHEK AVIVA PLUS Test bid. E11.9   lisinopril 5 MG tablet Commonly known as:  PRINIVIL,ZESTRIL TAKE 1 TABLET ONCE DAILY.   metFORMIN 500 MG tablet Commonly known as:  GLUCOPHAGE Take 1 tablet (500  mg total) by mouth 2 (two) times daily.   metoprolol tartrate 50 MG tablet Commonly known as:  LOPRESSOR Take 1 tablet (50 mg total) by mouth 2 (two) times daily.   sitaGLIPtin 100 MG tablet Commonly known as:  JANUVIA Take 1 tablet (100 mg total) by mouth daily.   tiotropium 18 MCG inhalation capsule Commonly known as:  SPIRIVA Place 1 capsule (18 mcg total) into inhaler and inhale daily.   VITAMIN B 12 PO Take 1,000 mg by mouth daily.   Vitamin D 2000 units Caps Take 1 capsule (2,000 Units total) by mouth daily.            Objective:    BP (!) 154/83   Pulse 84   Temp (!) 97.3 F (36.3 C) (Oral)   Ht 5\' 9"  (1.753 m)   Wt 194 lb (88 kg)   BMI 28.65 kg/m   Wt Readings from Last 3 Encounters:  03/02/18 194 lb (88 kg)  11/29/17 184 lb (83.5 kg)  10/25/17 173 lb (78.5 kg)    Physical Exam  Constitutional: He is oriented to person, place, and time. He appears well-developed and well-nourished. No distress.  Eyes: Conjunctivae are normal. No scleral icterus.  Neck: Neck supple. No thyromegaly present.  Cardiovascular: Normal rate, regular rhythm, normal heart sounds and intact distal pulses.  No murmur heard. Pulmonary/Chest: Effort normal and breath sounds normal. No respiratory distress. He has no wheezes. He has no rales.  Musculoskeletal: Normal range of motion. He exhibits no edema.  Lymphadenopathy:    He has no cervical adenopathy.  Neurological: He is alert and oriented to person, place, and time. Coordination normal.  Skin: Skin is warm and dry. No rash noted. He is not diaphoretic.  Psychiatric: He has a normal mood and affect. His behavior is normal.  Nursing note and vitals reviewed.       Assessment & Plan:   Problem List Items Addressed This Visit      Cardiovascular and Mediastinum   Hypertension associated with diabetes (HCC) - Primary   Relevant Medications   atorvastatin (LIPITOR) 20 MG tablet   metFORMIN (GLUCOPHAGE) 500 MG tablet   lisinopril (PRINIVIL,ZESTRIL) 5 MG tablet   metoprolol tartrate (LOPRESSOR) 50 MG tablet   sitaGLIPtin (JANUVIA) 100 MG tablet   furosemide (LASIX) 20 MG tablet     Respiratory   COPD (chronic obstructive pulmonary disease) (HCC)   Relevant Medications   fluticasone furoate-vilanterol (BREO ELLIPTA) 100-25 MCG/INH AEPB   tiotropium (SPIRIVA) 18 MCG inhalation capsule     Endocrine   Hyperlipidemia associated with type 2 diabetes mellitus (HCC)   Relevant Medications   atorvastatin (LIPITOR) 20 MG tablet   metFORMIN (GLUCOPHAGE) 500 MG  tablet   lisinopril (PRINIVIL,ZESTRIL) 5 MG tablet   metoprolol tartrate (LOPRESSOR) 50 MG tablet   sitaGLIPtin (JANUVIA) 100 MG tablet   furosemide (LASIX) 20 MG tablet   Diabetes mellitus without complication (HCC)   Relevant Medications   atorvastatin (LIPITOR) 20 MG tablet   metFORMIN (GLUCOPHAGE) 500 MG tablet   lisinopril (PRINIVIL,ZESTRIL) 5 MG tablet   sitaGLIPtin (JANUVIA) 100 MG tablet   Other Relevant Orders   Bayer DCA Hb A1c Waived     Other   Tobacco user    Patient has no intentions of quitting smoking, educated him on this.  Follow up plan: Return in about 3 months (around 06/02/2018), or if symptoms worsen or fail to improve, for Diabetes and cholesterol recheck.  Counseling provided for all of  the vaccine components Orders Placed This Encounter  Procedures  . Tdap vaccine greater than or equal to 7yo IM  . Bayer [facility] Hb A1c Waived    Arville Care, MD [facility] 03/02/2018, 8:52 AM

## 2018-06-05 ENCOUNTER — Ambulatory Visit: Payer: Medicare Other | Admitting: Family Medicine

## 2018-06-05 ENCOUNTER — Encounter: Payer: Self-pay | Admitting: Family Medicine

## 2018-06-05 VITALS — BP 143/87 | HR 107 | Temp 98.9°F | Ht 69.0 in | Wt 194.0 lb

## 2018-06-05 DIAGNOSIS — J439 Emphysema, unspecified: Secondary | ICD-10-CM | POA: Diagnosis not present

## 2018-06-05 DIAGNOSIS — E1159 Type 2 diabetes mellitus with other circulatory complications: Secondary | ICD-10-CM

## 2018-06-05 DIAGNOSIS — E538 Deficiency of other specified B group vitamins: Secondary | ICD-10-CM | POA: Diagnosis not present

## 2018-06-05 DIAGNOSIS — I10 Essential (primary) hypertension: Secondary | ICD-10-CM | POA: Diagnosis not present

## 2018-06-05 DIAGNOSIS — E1169 Type 2 diabetes mellitus with other specified complication: Secondary | ICD-10-CM

## 2018-06-05 DIAGNOSIS — E785 Hyperlipidemia, unspecified: Secondary | ICD-10-CM

## 2018-06-05 DIAGNOSIS — E663 Overweight: Secondary | ICD-10-CM | POA: Diagnosis not present

## 2018-06-05 DIAGNOSIS — E119 Type 2 diabetes mellitus without complications: Secondary | ICD-10-CM | POA: Diagnosis not present

## 2018-06-05 DIAGNOSIS — E559 Vitamin D deficiency, unspecified: Secondary | ICD-10-CM | POA: Diagnosis not present

## 2018-06-05 LAB — BAYER DCA HB A1C WAIVED: HB A1C (BAYER DCA - WAIVED): 7.1 % — ABNORMAL HIGH (ref <7.0)

## 2018-06-05 MED ORDER — TIOTROPIUM BROMIDE MONOHYDRATE 18 MCG IN CAPS: 18.0000 ug | ORAL_CAPSULE | Freq: Every day | RESPIRATORY_TRACT | 1 refills | Status: DC

## 2018-06-05 NOTE — Progress Notes (Signed)
BP (!) 143/87   Pulse (!) 107   Temp 98.9 F (37.2 C) (Oral)   Ht 5' 9"  (1.753 m)   Wt 194 lb (88 kg)   BMI 28.65 kg/m    Subjective:    Patient ID: Ray Carlson, male    DOB: Apr 18, 1946, 72 y.o.   MRN: [id number]  HPI: Ray Carlson is a 72 y.o. male presenting on 06/05/2018 for Diabetes; Hyperlipidemia; and Hypertension   HPI Hyperlipidemia Patient is coming in for recheck of his hyperlipidemia. The patient is currently taking atorvastatin. They deny any issues with myalgias or history of liver damage from it. They deny any focal numbness or weakness or chest pain.   Type 2 diabetes mellitus Patient comes in today for recheck of his diabetes. Patient has been currently taking glipizide and metformin and Januvia, patient says his blood sugar this morning was 106. Patient is currently on an ACE inhibitor/ARB. Patient has not seen an ophthalmologist this year. Patient denies any issues with their feet.   Hypertension Patient is currently on lisinopril and metoprolol, and their blood pressure today is 143/87. Patient denies any lightheadedness or dizziness. Patient denies headaches, blurred vision, chest pains, shortness of breath, or weakness. Denies any side effects from medication and is content with current medication.   Vitamin B12 and vitamin D deficiency recheck today  COPD Patient is coming in for COPD recheck today.  He is currently on Brio Ellipta and Spiriva.  He has a mild chronic cough but denies any major coughing spells or wheezing spells.  He has 0nighttime symptoms per week and 0daytime symptoms per week currently.   Relevant past medical, surgical, family and social history reviewed and updated as indicated. Interim medical history since our last visit reviewed. Allergies and medications reviewed and updated.  Review of Systems  Constitutional: Negative for chills and fever.  Respiratory: Positive for cough. Negative for shortness of breath and wheezing.     Cardiovascular: Negative for chest pain and leg swelling.  Musculoskeletal: Negative for back pain and gait problem.  Skin: Negative for rash.  Neurological: Negative for weakness, light-headedness and numbness.  All other systems reviewed and are negative.   Per HPI unless specifically indicated above   Allergies as of 06/05/2018      Reactions   Penicillins       Medication List        Accurate as of 06/05/18  9:01 AM. Always use your most recent med list.          atorvastatin 20 MG tablet Commonly known as:  LIPITOR Take 1 tablet (20 mg total) by mouth daily.   fluticasone furoate-vilanterol 100-25 MCG/INH Aepb Commonly known as:  BREO ELLIPTA Inhale 1 puff into the lungs daily.   furosemide 20 MG tablet Commonly known as:  LASIX Take 1 tablet (20 mg total) by mouth daily.   glipiZIDE 5 MG tablet Commonly known as:  GLUCOTROL Take by mouth daily before breakfast.   glucose blood test strip Commonly known as:  ACCU-CHEK AVIVA PLUS Test bid. E11.9   lisinopril 5 MG tablet Commonly known as:  PRINIVIL,ZESTRIL Take 1 tablet (5 mg total) by mouth daily.   metFORMIN 500 MG tablet Commonly known as:  GLUCOPHAGE Take 1 tablet (500 mg total) by mouth 2 (two) times daily.   metoprolol tartrate 50 MG tablet Commonly known as:  LOPRESSOR Take 1 tablet (50 mg total) by mouth 2 (two) times daily.   sitaGLIPtin 100 MG tablet  Commonly known as:  JANUVIA Take 1 tablet (100 mg total) by mouth daily.   tiotropium 18 MCG inhalation capsule Commonly known as:  SPIRIVA Place 1 capsule (18 mcg total) into inhaler and inhale daily.   VITAMIN B 12 PO Take 1,000 mg by mouth daily.   Vitamin D 2000 units Caps Take 1 capsule (2,000 Units total) by mouth daily.          Objective:    BP (!) 143/87   Pulse (!) 107   Temp 98.9 F (37.2 C) (Oral)   Ht 5' 9"  (1.753 m)   Wt 194 lb (88 kg)   BMI 28.65 kg/m   Wt Readings from Last 3 Encounters:  06/05/18 194 lb (88  kg)  03/02/18 194 lb (88 kg)  11/29/17 184 lb (83.5 kg)    Physical Exam  Constitutional: He is oriented to person, place, and time. He appears well-developed and well-nourished. No distress.  Eyes: Conjunctivae are normal. No scleral icterus.  Neck: Neck supple. No thyromegaly present.  Cardiovascular: Normal rate, regular rhythm, normal heart sounds and intact distal pulses.  No murmur heard. Pulmonary/Chest: Effort normal and breath sounds normal. No respiratory distress. He has no wheezes.  Musculoskeletal: Normal range of motion. He exhibits no edema.  Lymphadenopathy:    He has no cervical adenopathy.  Neurological: He is alert and oriented to person, place, and time. Coordination normal.  Skin: Skin is warm and dry. No rash noted. He is not diaphoretic.  Psychiatric: He has a normal mood and affect. His behavior is normal.  Nursing note and vitals reviewed.   Results for orders placed or performed in visit on 03/02/18  Bayer DCA Hb A1c Waived  Result Value Ref Range   HB A1C (BAYER DCA - WAIVED) 6.4 <7.0 %      Assessment & Plan:   Problem List Items Addressed This Visit      Cardiovascular and Mediastinum   Hypertension associated with diabetes ([place]) - Primary   Relevant Medications   glipiZIDE (GLUCOTROL) 5 MG tablet     Respiratory   COPD (chronic obstructive pulmonary disease) (HCC)   Relevant Medications   tiotropium (SPIRIVA) 18 MCG inhalation capsule     Endocrine   Hyperlipidemia associated with type 2 diabetes mellitus (HCC)   Relevant Medications   glipiZIDE (GLUCOTROL) 5 MG tablet   Other Relevant Orders   Lipid panel   Diabetes mellitus without complication (HCC)   Relevant Medications   glipiZIDE (GLUCOTROL) 5 MG tablet   Other Relevant Orders   Microalbumin / creatinine urine ratio   Bayer DCA Hb A1c Waived   CMP14+EGFR     Other   Vitamin D deficiency   Relevant Orders   VITAMIN D 25 Hydroxy (Vit-D Deficiency, Fractures)   Vitamin B 12  deficiency   Relevant Orders   Vitamin B12   Overweight (BMI 25.0-29.9)       Follow up plan: Return in about 3 months (around 09/05/2018), or if symptoms worsen or fail to improve, for Recheck hypertension and diabetes.  Counseling provided for all of the vaccine components Orders Placed This Encounter  Procedures  . Microalbumin / creatinine urine ratio  . Bayer DCA Hb A1c Waived  . CMP14+EGFR  . Lipid panel  . Vitamin B12  . VITAMIN D 25 Hydroxy (Vit-D Deficiency, Fractures)    Caryl Pina, MD [facility] 06/05/2018, 9:01 AM

## 2018-06-06 ENCOUNTER — Other Ambulatory Visit: Payer: Medicare Other

## 2018-06-06 DIAGNOSIS — E119 Type 2 diabetes mellitus without complications: Secondary | ICD-10-CM | POA: Diagnosis not present

## 2018-06-06 LAB — CMP14+EGFR
ALT: 20 IU/L (ref 0–44)
AST: 13 IU/L (ref 0–40)
Albumin/Globulin Ratio: 1.6 (ref 1.2–2.2)
Albumin: 4.3 g/dL (ref 3.5–4.8)
Alkaline Phosphatase: 53 IU/L (ref 39–117)
BUN/Creatinine Ratio: 17 (ref 10–24)
BUN: 19 mg/dL (ref 8–27)
Bilirubin Total: 0.7 mg/dL (ref 0.0–1.2)
CO2: 25 mmol/L (ref 20–29)
Calcium: 9.4 mg/dL (ref 8.6–10.2)
Chloride: 97 mmol/L (ref 96–106)
Creatinine, Ser: 1.14 mg/dL (ref 0.76–1.27)
GFR calc Af Amer: 73 mL/min/{1.73_m2} (ref >59)
GFR calc non Af Amer: 63 mL/min/{1.73_m2} (ref >59)
Globulin, Total: 2.7 g/dL (ref 1.5–4.5)
Glucose: 188 mg/dL — ABNORMAL HIGH (ref 65–99)
Potassium: 4.7 mmol/L (ref 3.5–5.2)
Sodium: 134 mmol/L (ref 134–144)
Total Protein: 7 g/dL (ref 6.0–8.5)

## 2018-06-06 LAB — LIPID PANEL
Chol/HDL Ratio: 4.4 ratio (ref 0.0–5.0)
Cholesterol, Total: 148 mg/dL (ref 100–199)
HDL: 34 mg/dL — ABNORMAL LOW (ref >39)
LDL Calculated: 93 mg/dL (ref 0–99)
Triglycerides: 105 mg/dL (ref 0–149)
VLDL Cholesterol Cal: 21 mg/dL (ref 5–40)

## 2018-06-06 LAB — VITAMIN D 25 HYDROXY (VIT D DEFICIENCY, FRACTURES): Vit D, 25-Hydroxy: 62.5 ng/mL (ref 30.0–100.0)

## 2018-06-06 LAB — VITAMIN B12: Vitamin B-12: >2000 pg/mL — ABNORMAL HIGH (ref 232–1245)

## 2018-06-07 LAB — MICROALBUMIN / CREATININE URINE RATIO
Creatinine, Urine: 60.5 mg/dL
Microalb/Creat Ratio: 5.6 mg/g creat (ref 0.0–30.0)
Microalbumin, Urine: 3.4 ug/mL

## 2018-09-05 ENCOUNTER — Ambulatory Visit: Payer: Medicare Other | Admitting: Family Medicine

## 2018-09-05 ENCOUNTER — Encounter: Payer: Self-pay | Admitting: Family Medicine

## 2018-09-05 VITALS — BP 156/83 | HR 90 | Temp 97.1°F | Ht 69.0 in | Wt 194.4 lb

## 2018-09-05 DIAGNOSIS — E1169 Type 2 diabetes mellitus with other specified complication: Secondary | ICD-10-CM | POA: Diagnosis not present

## 2018-09-05 DIAGNOSIS — I10 Essential (primary) hypertension: Secondary | ICD-10-CM

## 2018-09-05 DIAGNOSIS — E785 Hyperlipidemia, unspecified: Secondary | ICD-10-CM

## 2018-09-05 DIAGNOSIS — E1159 Type 2 diabetes mellitus with other circulatory complications: Secondary | ICD-10-CM

## 2018-09-05 DIAGNOSIS — Z23 Encounter for immunization: Secondary | ICD-10-CM

## 2018-09-05 DIAGNOSIS — I152 Hypertension secondary to endocrine disorders: Secondary | ICD-10-CM

## 2018-09-05 DIAGNOSIS — J439 Emphysema, unspecified: Secondary | ICD-10-CM

## 2018-09-05 LAB — BAYER DCA HB A1C WAIVED: HB A1C (BAYER DCA - WAIVED): 7.1 % — ABNORMAL HIGH (ref <7.0)

## 2018-09-05 MED ORDER — GLIPIZIDE 5 MG PO TABS: 5.0000 mg | ORAL_TABLET | Freq: Two times a day (BID) | ORAL | 3 refills | Status: DC

## 2018-09-05 MED ORDER — LISINOPRIL 5 MG PO TABS: 5.0000 mg | ORAL_TABLET | Freq: Every day | ORAL | 3 refills | Status: DC

## 2018-09-05 MED ORDER — FUROSEMIDE 20 MG PO TABS: 20.0000 mg | ORAL_TABLET | Freq: Every day | ORAL | 3 refills | Status: DC

## 2018-09-05 MED ORDER — ATORVASTATIN CALCIUM 20 MG PO TABS: 20.0000 mg | ORAL_TABLET | Freq: Every day | ORAL | 3 refills | Status: DC

## 2018-09-05 MED ORDER — METFORMIN HCL 500 MG PO TABS: 500.0000 mg | ORAL_TABLET | Freq: Two times a day (BID) | ORAL | 3 refills | Status: DC

## 2018-09-05 MED ORDER — SITAGLIPTIN PHOSPHATE 100 MG PO TABS: 100.0000 mg | ORAL_TABLET | Freq: Every day | ORAL | 3 refills | Status: DC

## 2018-09-05 NOTE — Progress Notes (Signed)
BP (!) 156/83   Pulse 90   Temp (!) 97.1 F (36.2 C) (Oral)   Ht 5\' 9"  (1.753 m)   Wt 194 lb 6.4 oz (88.2 kg)   BMI 28.71 kg/m    Subjective:    Patient ID: Ray Carlson, male    DOB: 04-Oct-1946, 72 y.o.   MRN: [id number]  HPI: Ray Carlson is a 72 y.o. male presenting on 09/05/2018 for Diabetes (3 month follow up) and Hypertension   HPI Type 2 diabetes mellitus Patient comes in today for recheck of his diabetes. Patient has been currently taking metformin and glipizide. Patient is currently on an ACE inhibitor/ARB. Patient has not seen an ophthalmologist this year. Patient denies any issues with their feet.   Hypertension Patient is currently on lisinopril and metoprolol, lisinopril is a new addition today, and their blood pressure today is 156/83. Patient denies any lightheadedness or dizziness. Patient denies headaches, blurred vision, chest pains, shortness of breath, or weakness. Denies any side effects from medication and is content with current medication.   COPD Patient is coming in for COPD recheck today.  He is currently on Spiriva and Brio.  He has a mild chronic cough but denies any major coughing spells or wheezing spells.  He has 0nighttime symptoms per week and 0daytime symptoms per week currently.   Hyperlipidemia Patient is coming in for recheck of his hyperlipidemia. The patient is currently taking Lipitor. They deny any issues with myalgias or history of liver damage from it. They deny any focal numbness or weakness or chest pain.   Relevant past medical, surgical, family and social history reviewed and updated as indicated. Interim medical history since our last visit reviewed. Allergies and medications reviewed and updated.  Review of Systems  Constitutional: Negative for chills and fever.  Eyes: Negative for visual disturbance.  Respiratory: Negative for shortness of breath and wheezing.   Cardiovascular: Negative for chest pain and leg swelling.    Musculoskeletal: Negative for back pain and gait problem.  Skin: Negative for rash.  Neurological: Negative for dizziness, weakness, light-headedness and numbness.  All other systems reviewed and are negative.   Per HPI unless specifically indicated above   Allergies as of 09/05/2018      Reactions   Penicillins       Medication List        Accurate as of 09/05/18 11:49 AM. Always use your most recent med list.          atorvastatin 20 MG tablet Commonly known as:  LIPITOR Take 1 tablet (20 mg total) by mouth daily.   fluticasone furoate-vilanterol 100-25 MCG/INH Aepb Commonly known as:  BREO ELLIPTA Inhale 1 puff into the lungs daily.   furosemide 20 MG tablet Commonly known as:  LASIX Take 1 tablet (20 mg total) by mouth daily.   glipiZIDE 5 MG tablet Commonly known as:  GLUCOTROL Take 1 tablet (5 mg total) by mouth 2 (two) times daily.   glucose blood test strip Test bid. E11.9   lisinopril 5 MG tablet Commonly known as:  PRINIVIL,ZESTRIL Take 1 tablet (5 mg total) by mouth daily.   metFORMIN 500 MG tablet Commonly known as:  GLUCOPHAGE Take 1 tablet (500 mg total) by mouth 2 (two) times daily.   metoprolol tartrate 50 MG tablet Commonly known as:  LOPRESSOR Take 1 tablet (50 mg total) by mouth 2 (two) times daily.   sitaGLIPtin 100 MG tablet Commonly known as:  JANUVIA Take 1 tablet (  100 mg total) by mouth daily.   tiotropium 18 MCG inhalation capsule Commonly known as:  SPIRIVA Place 1 capsule (18 mcg total) into inhaler and inhale daily.   VITAMIN B 12 PO Take 1,000 mg by mouth daily.   Vitamin D 2000 units Caps Take 1 capsule (2,000 Units total) by mouth daily.          Objective:    BP (!) 156/83   Pulse 90   Temp (!) 97.1 F (36.2 C) (Oral)   Ht 5\' 9"  (1.753 m)   Wt 194 lb 6.4 oz (88.2 kg)   BMI 28.71 kg/m   Wt Readings from Last 3 Encounters:  09/05/18 194 lb 6.4 oz (88.2 kg)  06/05/18 194 lb (88 kg)  03/02/18 194 lb (88  kg)    Physical Exam  Constitutional: He is oriented to person, place, and time. He appears well-developed and well-nourished. No distress.  Eyes: Conjunctivae are normal. No scleral icterus.  Neck: Neck supple. No thyromegaly present.  Cardiovascular: Normal rate, regular rhythm, normal heart sounds and intact distal pulses.  No murmur heard. Pulmonary/Chest: Effort normal and breath sounds normal. No respiratory distress. He has no wheezes.  Musculoskeletal: Normal range of motion. He exhibits no edema.  Lymphadenopathy:    He has no cervical adenopathy.  Neurological: He is alert and oriented to person, place, and time. Coordination normal.  Skin: Skin is warm and dry. No rash noted. He is not diaphoretic.  Psychiatric: He has a normal mood and affect. His behavior is normal.  Nursing note and vitals reviewed.      Assessment & Plan:   Problem List Items Addressed This Visit      Cardiovascular and Mediastinum   Hypertension associated with diabetes (HCC)   Relevant Medications   metFORMIN (GLUCOPHAGE) 500 MG tablet   sitaGLIPtin (JANUVIA) 100 MG tablet   atorvastatin (LIPITOR) 20 MG tablet   lisinopril (PRINIVIL,ZESTRIL) 5 MG tablet   furosemide (LASIX) 20 MG tablet   glipiZIDE (GLUCOTROL) 5 MG tablet     Respiratory   COPD (chronic obstructive pulmonary disease) (HCC)     Endocrine   Hyperlipidemia associated with type 2 diabetes mellitus (HCC)   Relevant Medications   metFORMIN (GLUCOPHAGE) 500 MG tablet   sitaGLIPtin (JANUVIA) 100 MG tablet   atorvastatin (LIPITOR) 20 MG tablet   lisinopril (PRINIVIL,ZESTRIL) 5 MG tablet   furosemide (LASIX) 20 MG tablet   glipiZIDE (GLUCOTROL) 5 MG tablet   Diabetes mellitus without complication (HCC) - Primary   Relevant Medications   metFORMIN (GLUCOPHAGE) 500 MG tablet   sitaGLIPtin (JANUVIA) 100 MG tablet   atorvastatin (LIPITOR) 20 MG tablet   lisinopril (PRINIVIL,ZESTRIL) 5 MG tablet   glipiZIDE (GLUCOTROL) 5 MG tablet     Other Visit Diagnoses    Encounter for immunization       Relevant Orders   Flu vaccine HIGH DOSE PF (Completed)      Continue current medications, his blood pressures elevated but we are letting it run a little bit high because he has had hypotensive episodes in the past.  Patient says his breathing is doing really well and will continue his current inhalers. Follow up plan: Return in about 3 months (around 12/05/2018), or if symptoms worsen or fail to improve, for Diabetes and hypertension cholesterol.  Counseling provided for all of the vaccine components Orders Placed This Encounter  Procedures  . Bayer [facility] Hb A1c Waived    Arville Care, MD [facility]  09/05/2018, 11:49 AM

## 2018-09-16 ENCOUNTER — Other Ambulatory Visit: Payer: Self-pay | Admitting: Family Medicine

## 2018-09-16 DIAGNOSIS — J439 Emphysema, unspecified: Secondary | ICD-10-CM

## 2018-11-01 ENCOUNTER — Other Ambulatory Visit: Payer: Self-pay | Admitting: Family Medicine

## 2018-11-01 DIAGNOSIS — E119 Type 2 diabetes mellitus without complications: Secondary | ICD-10-CM

## 2018-11-01 DIAGNOSIS — I10 Essential (primary) hypertension: Principal | ICD-10-CM

## 2018-11-01 DIAGNOSIS — E1159 Type 2 diabetes mellitus with other circulatory complications: Secondary | ICD-10-CM

## 2018-12-10 ENCOUNTER — Ambulatory Visit: Payer: Medicare Other | Admitting: Family Medicine

## 2018-12-10 ENCOUNTER — Encounter: Payer: Self-pay | Admitting: Family Medicine

## 2018-12-10 VITALS — BP 143/80 | HR 87 | Temp 97.2°F | Ht 69.0 in | Wt 195.4 lb

## 2018-12-10 DIAGNOSIS — E1159 Type 2 diabetes mellitus with other circulatory complications: Secondary | ICD-10-CM

## 2018-12-10 DIAGNOSIS — I10 Essential (primary) hypertension: Secondary | ICD-10-CM | POA: Diagnosis not present

## 2018-12-10 DIAGNOSIS — J439 Emphysema, unspecified: Secondary | ICD-10-CM

## 2018-12-10 DIAGNOSIS — E1169 Type 2 diabetes mellitus with other specified complication: Secondary | ICD-10-CM | POA: Diagnosis not present

## 2018-12-10 DIAGNOSIS — E785 Hyperlipidemia, unspecified: Secondary | ICD-10-CM

## 2018-12-10 LAB — LIPID PANEL
Chol/HDL Ratio: 4.3 ratio (ref 0.0–5.0)
Cholesterol, Total: 169 mg/dL (ref 100–199)
HDL: 39 mg/dL — ABNORMAL LOW (ref >39)
LDL Calculated: 112 mg/dL — ABNORMAL HIGH (ref 0–99)
Triglycerides: 91 mg/dL (ref 0–149)
VLDL Cholesterol Cal: 18 mg/dL (ref 5–40)

## 2018-12-10 LAB — CMP14+EGFR
ALT: 25 IU/L (ref 0–44)
AST: 18 IU/L (ref 0–40)
Albumin/Globulin Ratio: 1.6 (ref 1.2–2.2)
Albumin: 4.4 g/dL (ref 3.5–4.8)
Alkaline Phosphatase: 58 IU/L (ref 39–117)
BUN/Creatinine Ratio: 12 (ref 10–24)
BUN: 15 mg/dL (ref 8–27)
Bilirubin Total: 0.3 mg/dL (ref 0.0–1.2)
CO2: 23 mmol/L (ref 20–29)
Calcium: 9.6 mg/dL (ref 8.6–10.2)
Chloride: 95 mmol/L — ABNORMAL LOW (ref 96–106)
Creatinine, Ser: 1.23 mg/dL (ref 0.76–1.27)
GFR calc Af Amer: 67 mL/min/{1.73_m2} (ref >59)
GFR calc non Af Amer: 58 mL/min/{1.73_m2} — ABNORMAL LOW (ref >59)
Globulin, Total: 2.7 g/dL (ref 1.5–4.5)
Glucose: 162 mg/dL — ABNORMAL HIGH (ref 65–99)
Potassium: 4.8 mmol/L (ref 3.5–5.2)
Sodium: 133 mmol/L — ABNORMAL LOW (ref 134–144)
Total Protein: 7.1 g/dL (ref 6.0–8.5)

## 2018-12-10 LAB — BAYER DCA HB A1C WAIVED: HB A1C (BAYER DCA - WAIVED): 7.1 % — ABNORMAL HIGH (ref <7.0)

## 2018-12-10 MED ORDER — FLUTICASONE FUROATE-VILANTEROL 100-25 MCG/INH IN AEPB: 1.0000 | INHALATION_SPRAY | Freq: Every day | RESPIRATORY_TRACT | 3 refills | Status: DC

## 2018-12-10 MED ORDER — METOPROLOL TARTRATE 50 MG PO TABS: 50.0000 mg | ORAL_TABLET | Freq: Two times a day (BID) | ORAL | 3 refills | Status: DC

## 2018-12-10 MED ORDER — METFORMIN HCL 500 MG PO TABS: 500.0000 mg | ORAL_TABLET | Freq: Two times a day (BID) | ORAL | 3 refills | Status: DC

## 2018-12-10 MED ORDER — TIOTROPIUM BROMIDE MONOHYDRATE 18 MCG IN CAPS: 18.0000 ug | ORAL_CAPSULE | Freq: Every day | RESPIRATORY_TRACT | 3 refills | Status: DC

## 2018-12-10 NOTE — Progress Notes (Signed)
BP (!) 143/80   Pulse 87   Temp (!) 97.2 F (36.2 C) (Oral)   Ht _0  (1.753 m)   Wt 195 lb 6.4 oz (88.6 kg)   BMI 28.86 kg/m    Subjective:    Patient ID: Ray Carlson, male    DOB: 15-Jan-1946, 72 y.o.   MRN: [id number]  HPI: Ray Carlson is a 73 y.o. male presenting on 12/10/2018 for Diabetes (3 month follow up) and Hypertension   HPI Type 2 diabetes mellitus Patient comes in today for recheck of his diabetes. Patient has been currently taking metformin and glipizide and Januvia. Patient is currently on an ACE inhibitor/ARB. Patient has seen an ophthalmologist this year. Patient denies any issues with their feet.   Hyperlipidemia Patient is coming in for recheck of his hyperlipidemia. The patient is currently taking Lipitor. They deny any issues with myalgias or history of liver damage from it. They deny any focal numbness or weakness or chest pain.   Hypertension Patient is currently on lisinopril 5 mg and metoprolol 50, and their blood pressure today is 143/80. Patient denies any lightheadedness or dizziness. Patient denies headaches, blurred vision, chest pains, shortness of breath, or weakness. Denies any side effects from medication and is content with current medication.   COPD Patient is coming in for COPD recheck today.  He is currently on Spiriva and Brio.  He has a mild chronic cough but denies any major coughing spells or wheezing spells.  He has 1nighttime symptoms per week and 0daytime symptoms per week currently.  Patient feels like he is doing really well on his inhalers currently.  Relevant past medical, surgical, family and social history reviewed and updated as indicated. Interim medical history since our last visit reviewed. Allergies and medications reviewed and updated.  Review of Systems  Constitutional: Negative for chills and fever.  Eyes: Negative for visual disturbance.  Respiratory: Negative for shortness of breath and wheezing.   Cardiovascular:  Negative for chest pain and leg swelling.  Musculoskeletal: Negative for back pain and gait problem.  Skin: Negative for rash.  Neurological: Negative for dizziness, weakness and numbness.  All other systems reviewed and are negative.   Per HPI unless specifically indicated above   Allergies as of 12/10/2018      Reactions   Penicillins       Medication List       Accurate as of December 10, 2018 11:45 AM. Always use your most recent med list.        atorvastatin 20 MG tablet Commonly known as:  LIPITOR Take 1 tablet (20 mg total) by mouth daily.   fluticasone furoate-vilanterol 100-25 MCG/INH Aepb Commonly known as:  BREO ELLIPTA Inhale 1 puff into the lungs daily.   furosemide 20 MG tablet Commonly known as:  LASIX Take 1 tablet (20 mg total) by mouth daily.   glipiZIDE 5 MG tablet Commonly known as:  GLUCOTROL Take 1 tablet (5 mg total) by mouth 2 (two) times daily.   glucose blood test strip Commonly known as:  ACCU-CHEK AVIVA PLUS Test bid. E11.9   lisinopril 5 MG tablet Commonly known as:  PRINIVIL,ZESTRIL Take 1 tablet (5 mg total) by mouth daily.   metFORMIN 500 MG tablet Commonly known as:  GLUCOPHAGE Take 1 tablet (500 mg total) by mouth 2 (two) times daily.   metoprolol tartrate 50 MG tablet Commonly known as:  LOPRESSOR Take 1 tablet (50 mg total) by mouth 2 (two) times daily.  sitaGLIPtin 100 MG tablet Commonly known as:  JANUVIA Take 1 tablet (100 mg total) by mouth daily.   tiotropium 18 MCG inhalation capsule Commonly known as:  SPIRIVA Place 1 capsule (18 mcg total) into inhaler and inhale daily.   VITAMIN B 12 PO Take 1,000 mg by mouth daily.   Vitamin D 50 MCG (2000 UT) Caps Take 1 capsule (2,000 Units total) by mouth daily.          Objective:    BP (!) 143/80   Pulse 87   Temp (!) 97.2 F (36.2 C) (Oral)   Ht _0  (1.753 m)   Wt 195 lb 6.4 oz (88.6 kg)   BMI 28.86 kg/m   Wt Readings from Last 3 Encounters:    12/10/18 195 lb 6.4 oz (88.6 kg)  09/05/18 194 lb 6.4 oz (88.2 kg)  06/05/18 194 lb (88 kg)    Physical Exam Vitals signs and nursing note reviewed.  Constitutional:      General: He is not in acute distress.    Appearance: He is well-developed. He is not diaphoretic.     Comments: Patient uses rolling walker and has decreased core strength to get around.  Eyes:     General: No scleral icterus.    Conjunctiva/sclera: Conjunctivae normal.  Neck:     Musculoskeletal: Neck supple.     Thyroid: No thyromegaly.  Cardiovascular:     Rate and Rhythm: Normal rate and regular rhythm.     Heart sounds: Normal heart sounds. No murmur.  Pulmonary:     Effort: Pulmonary effort is normal. No respiratory distress.     Breath sounds: Normal breath sounds. No wheezing.  Musculoskeletal: Normal range of motion.  Lymphadenopathy:     Cervical: No cervical adenopathy.  Skin:    General: Skin is warm and dry.     Findings: No rash.  Neurological:     Mental Status: He is alert and oriented to person, place, and time.     Coordination: Coordination normal.  Psychiatric:        Behavior: Behavior normal.     Results for orders placed or performed in visit on 09/05/18  Bayer DCA Hb A1c Waived  Result Value Ref Range   HB A1C (BAYER DCA - WAIVED) 7.1 (H) <7.0 %      Assessment & Plan:   Problem List Items Addressed This Visit      Cardiovascular and Mediastinum   Hypertension associated with diabetes ([place])   Relevant Medications   metoprolol tartrate (LOPRESSOR) 50 MG tablet   metFORMIN (GLUCOPHAGE) 500 MG tablet   Other Relevant Orders   CMP14+EGFR     Respiratory   COPD (chronic obstructive pulmonary disease) (HCC) - Primary   Relevant Medications   fluticasone furoate-vilanterol (BREO ELLIPTA) 100-25 MCG/INH AEPB   tiotropium (SPIRIVA) 18 MCG inhalation capsule     Endocrine   Hyperlipidemia associated with type 2 diabetes mellitus (HCC)   Relevant Medications   metFORMIN  (GLUCOPHAGE) 500 MG tablet   Other Relevant Orders   Lipid panel   Type 2 diabetes mellitus with other specified complication (HCC)   Relevant Medications   metFORMIN (GLUCOPHAGE) 500 MG tablet   Other Relevant Orders   Bayer DCA Hb A1c Waived    Continue current medication, give refills of medication, will check blood work today, COPD seems to be doing well.  Follow up plan: Return in about 3 months (around 03/11/2019), or if symptoms worsen or fail to improve,  for Recheck hypertension and cholesterol and diabetes.  Counseling provided for all of the vaccine components Orders Placed This Encounter  Procedures  . CMP14+EGFR  . Lipid panel  . Bayer [facility] Hb A1c Sibley, MD [facility] 12/10/2018, 11:45 AM

## 2019-03-12 ENCOUNTER — Ambulatory Visit (INDEPENDENT_AMBULATORY_CARE_PROVIDER_SITE_OTHER): Payer: Medicare HMO | Admitting: Family Medicine

## 2019-03-12 ENCOUNTER — Other Ambulatory Visit: Payer: Self-pay

## 2019-03-12 ENCOUNTER — Encounter: Payer: Self-pay | Admitting: Family Medicine

## 2019-03-12 DIAGNOSIS — I152 Hypertension secondary to endocrine disorders: Secondary | ICD-10-CM

## 2019-03-12 DIAGNOSIS — I10 Essential (primary) hypertension: Secondary | ICD-10-CM | POA: Diagnosis not present

## 2019-03-12 DIAGNOSIS — J439 Emphysema, unspecified: Secondary | ICD-10-CM | POA: Diagnosis not present

## 2019-03-12 DIAGNOSIS — E785 Hyperlipidemia, unspecified: Secondary | ICD-10-CM | POA: Diagnosis not present

## 2019-03-12 DIAGNOSIS — E1159 Type 2 diabetes mellitus with other circulatory complications: Secondary | ICD-10-CM | POA: Diagnosis not present

## 2019-03-12 DIAGNOSIS — E1169 Type 2 diabetes mellitus with other specified complication: Secondary | ICD-10-CM | POA: Diagnosis not present

## 2019-03-12 MED ORDER — GLUCOSE BLOOD VI STRP: ORAL_STRIP | 2 refills | Status: DC

## 2019-03-12 MED ORDER — ACCU-CHEK AVIVA PLUS W/DEVICE KIT: 1.0000 | PACK | Freq: Every day | 3 refills | Status: DC

## 2019-03-12 NOTE — Progress Notes (Signed)
Virtual Visit via telephone Note  I connected with Ray Carlson on 03/12/19 at [phone] by telephone and verified that I am speaking with the correct person using two identifiers. Ray Carlson is currently located at home and no other people are currently with her during visit. The provider, Fransisca Kaufmann Dettinger, MD is located in their office at time of visit.  Call ended at [phone]  I discussed the limitations, risks, security and privacy concerns of performing an evaluation and management service by telephone and the availability of in person appointments. I also discussed with the patient that there may be a patient responsible charge related to this service. The patient expressed understanding and agreed to proceed.   History and Present Illness: Hypertension Patient is currently on metoprolol and lisinopril, and their blood pressure today is 138/84 at home. Patient denies any lightheadedness or dizziness. Patient denies headaches, blurred vision, chest pains, shortness of breath, or weakness. Denies any side effects from medication and is content with current medication.   COPD Patient is coming in for COPD recheck today.  He is currently on [place].  He has a mild chronic cough but denies any major coughing spells or wheezing spells.  He has 0nighttime symptoms per week and 0daytime symptoms per week currently.   Type 2 diabetes mellitus Patient comes in today for recheck of his diabetes. Patient has been currently taking metformin and glipizide and Januvia this am it was 142 and has been running 118-140. Patient is currently on an ACE inhibitor/ARB. Patient has not seen an ophthalmologist this year. Patient denies any issues with their feet.   No diagnosis found.  Outpatient Encounter Medications as of 03/12/2019  Medication Sig  . atorvastatin (LIPITOR) 20 MG tablet Take 1 tablet (20 mg total) by mouth daily.  . Cholecalciferol (VITAMIN D) 2000 UNITS CAPS Take 1 capsule (2,000 Units  total) by mouth daily.  . Cyanocobalamin (VITAMIN B 12 PO) Take 1,000 mg by mouth daily.  . fluticasone furoate-vilanterol (BREO ELLIPTA) 100-25 MCG/INH AEPB Inhale 1 puff into the lungs daily.  . furosemide (LASIX) 20 MG tablet Take 1 tablet (20 mg total) by mouth daily.  Marland Kitchen glipiZIDE (GLUCOTROL) 5 MG tablet Take 1 tablet (5 mg total) by mouth 2 (two) times daily.  Marland Kitchen glucose blood (ACCU-CHEK AVIVA PLUS) test strip Test bid. E11.9  . lisinopril (PRINIVIL,ZESTRIL) 5 MG tablet Take 1 tablet (5 mg total) by mouth daily.  . metFORMIN (GLUCOPHAGE) 500 MG tablet Take 1 tablet (500 mg total) by mouth 2 (two) times daily.  . metoprolol tartrate (LOPRESSOR) 50 MG tablet Take 1 tablet (50 mg total) by mouth 2 (two) times daily.  . sitaGLIPtin (JANUVIA) 100 MG tablet Take 1 tablet (100 mg total) by mouth daily.  Marland Kitchen tiotropium (SPIRIVA) 18 MCG inhalation capsule Place 1 capsule (18 mcg total) into inhaler and inhale daily.   No facility-administered encounter medications on file as of 03/12/2019.     Review of Systems  Constitutional: Negative for chills and fever.  Respiratory: Negative for shortness of breath and wheezing.   Cardiovascular: Negative for chest pain and leg swelling.  Musculoskeletal: Negative for back pain and gait problem.  Skin: Negative for rash.  Neurological: Negative for dizziness and light-headedness.  All other systems reviewed and are negative.   Observations/Objective: Patient sounds comfortable and not in any distress on the phone  Assessment and Plan: Problem List Items Addressed This Visit      Cardiovascular and Mediastinum  Hypertension associated with diabetes ([place])     Respiratory   COPD (chronic obstructive pulmonary disease) (HCC)     Endocrine   Hyperlipidemia associated with type 2 diabetes mellitus ([place])   Type 2 diabetes mellitus with other specified complication ([place]) - Primary   Relevant Medications   glucose blood (ACCU-CHEK AVIVA PLUS) test  strip   Blood Glucose Monitoring Suppl (ACCU-CHEK AVIVA PLUS) w/Device KIT       Follow Up Instructions:  Return to office in 3-4 months for recheck and labs    I discussed the assessment and treatment plan with the patient. The patient was provided an opportunity to ask questions and all were answered. The patient agreed with the plan and demonstrated an understanding of the instructions.   The patient was advised to call back or seek an in-person evaluation if the symptoms worsen or if the condition fails to improve as anticipated.  The above assessment and management plan was discussed with the patient. The patient verbalized understanding of and has agreed to the management plan. Patient is aware to call the clinic if symptoms persist or worsen. Patient is aware when to return to the clinic for a follow-up visit. Patient educated on when it is appropriate to go to the emergency department.    I provided 8 minutes of non-face-to-face time during this encounter.    Worthy Rancher, MD

## 2019-04-16 ENCOUNTER — Telehealth: Payer: Self-pay | Admitting: Family Medicine

## 2019-05-23 DIAGNOSIS — B353 Tinea pedis: Secondary | ICD-10-CM | POA: Diagnosis not present

## 2019-05-23 DIAGNOSIS — B351 Tinea unguium: Secondary | ICD-10-CM | POA: Diagnosis not present

## 2019-06-04 ENCOUNTER — Other Ambulatory Visit: Payer: Self-pay | Admitting: Family Medicine

## 2019-06-04 DIAGNOSIS — J439 Emphysema, unspecified: Secondary | ICD-10-CM

## 2019-06-04 DIAGNOSIS — E1159 Type 2 diabetes mellitus with other circulatory complications: Secondary | ICD-10-CM

## 2019-06-12 ENCOUNTER — Ambulatory Visit (INDEPENDENT_AMBULATORY_CARE_PROVIDER_SITE_OTHER): Payer: Medicare HMO | Admitting: Family Medicine

## 2019-06-12 NOTE — Progress Notes (Signed)
Attempted to call twice throughout the afternoon and unable to contact, disregard visit Caryl Pina, MD [place] 06/12/2019, 5:05 PM

## 2019-07-22 DIAGNOSIS — M25371 Other instability, right ankle: Secondary | ICD-10-CM | POA: Diagnosis not present

## 2019-07-22 DIAGNOSIS — M25372 Other instability, left ankle: Secondary | ICD-10-CM | POA: Diagnosis not present

## 2019-07-22 DIAGNOSIS — M76822 Posterior tibial tendinitis, left leg: Secondary | ICD-10-CM | POA: Diagnosis not present

## 2019-07-22 DIAGNOSIS — M76821 Posterior tibial tendinitis, right leg: Secondary | ICD-10-CM | POA: Diagnosis not present

## 2019-08-27 DIAGNOSIS — B351 Tinea unguium: Secondary | ICD-10-CM | POA: Diagnosis not present

## 2019-08-27 DIAGNOSIS — M79676 Pain in unspecified toe(s): Secondary | ICD-10-CM | POA: Diagnosis not present

## 2019-09-02 ENCOUNTER — Other Ambulatory Visit: Payer: Self-pay | Admitting: Family Medicine

## 2019-09-02 DIAGNOSIS — J439 Emphysema, unspecified: Secondary | ICD-10-CM

## 2019-09-12 DIAGNOSIS — H40033 Anatomical narrow angle, bilateral: Secondary | ICD-10-CM | POA: Diagnosis not present

## 2019-09-12 DIAGNOSIS — H04123 Dry eye syndrome of bilateral lacrimal glands: Secondary | ICD-10-CM | POA: Diagnosis not present

## 2019-09-30 ENCOUNTER — Other Ambulatory Visit: Payer: Self-pay

## 2019-09-30 ENCOUNTER — Ambulatory Visit (INDEPENDENT_AMBULATORY_CARE_PROVIDER_SITE_OTHER): Payer: Medicare HMO | Admitting: Family Medicine

## 2019-09-30 ENCOUNTER — Encounter: Payer: Self-pay | Admitting: Family Medicine

## 2019-09-30 VITALS — BP 149/82 | HR 81 | Temp 97.5°F | Ht 69.0 in | Wt 204.2 lb

## 2019-09-30 DIAGNOSIS — E785 Hyperlipidemia, unspecified: Secondary | ICD-10-CM

## 2019-09-30 DIAGNOSIS — E1159 Type 2 diabetes mellitus with other circulatory complications: Secondary | ICD-10-CM

## 2019-09-30 DIAGNOSIS — E559 Vitamin D deficiency, unspecified: Secondary | ICD-10-CM

## 2019-09-30 DIAGNOSIS — E538 Deficiency of other specified B group vitamins: Secondary | ICD-10-CM | POA: Diagnosis not present

## 2019-09-30 DIAGNOSIS — I10 Essential (primary) hypertension: Secondary | ICD-10-CM | POA: Diagnosis not present

## 2019-09-30 DIAGNOSIS — E1169 Type 2 diabetes mellitus with other specified complication: Secondary | ICD-10-CM | POA: Diagnosis not present

## 2019-09-30 DIAGNOSIS — E663 Overweight: Secondary | ICD-10-CM

## 2019-09-30 LAB — BAYER DCA HB A1C WAIVED: HB A1C (BAYER DCA - WAIVED): 7.9 % — ABNORMAL HIGH (ref <7.0)

## 2019-09-30 MED ORDER — LISINOPRIL 5 MG PO TABS: 5.0000 mg | ORAL_TABLET | Freq: Every day | ORAL | 3 refills | Status: DC

## 2019-09-30 MED ORDER — METFORMIN HCL 500 MG PO TABS: 500.0000 mg | ORAL_TABLET | Freq: Two times a day (BID) | ORAL | 3 refills | Status: DC

## 2019-09-30 MED ORDER — GLIPIZIDE 5 MG PO TABS: 5.0000 mg | ORAL_TABLET | Freq: Two times a day (BID) | ORAL | 3 refills | Status: DC

## 2019-09-30 MED ORDER — SITAGLIPTIN PHOSPHATE 100 MG PO TABS: 100.0000 mg | ORAL_TABLET | Freq: Every day | ORAL | 3 refills | Status: DC

## 2019-09-30 MED ORDER — METOPROLOL TARTRATE 50 MG PO TABS: 50.0000 mg | ORAL_TABLET | Freq: Two times a day (BID) | ORAL | 3 refills | Status: DC

## 2019-09-30 MED ORDER — PRAVASTATIN SODIUM 20 MG PO TABS: 20.0000 mg | ORAL_TABLET | Freq: Every day | ORAL | 3 refills | Status: DC

## 2019-09-30 NOTE — Progress Notes (Signed)
BP (!) 149/82   Pulse 81   Temp (!) 97.5 F (36.4 C) (Temporal)   Ht 5' 9"  (1.753 m)   Wt 204 lb 3.2 oz (92.6 kg)   SpO2 99%   BMI 30.16 kg/m    Subjective:   Patient ID: Ray Carlson, male    DOB: [birth year], 73 y.o.   MRN: [id number]  HPI: Ray Carlson is a 73 y.o. male presenting on 09/30/2019 for Diabetes (3 month follow up)   HPI Type 2 diabetes mellitus Patient comes in today for recheck of his diabetes. Patient has been currently taking Januvia and metformin and glipizide. Patient is currently on an ACE inhibitor/ARB. Patient has not seen an ophthalmologist this year. Patient denies any issues with their feet.  Patient sees a podiatrist Dr. Irving Shows every month to manage his feet and has braces on both shoes for foot drop  Hypertension Patient is currently on lisinopril and metoprolol, and their blood pressure today is 149/82. Patient denies any lightheadedness or dizziness. Patient denies headaches, blurred vision, chest pains, shortness of breath, or weakness. Denies any side effects from medication and is content with current medication.   Hyperlipidemia Patient is coming in for recheck of his hyperlipidemia. The patient is currently taking no medication because stopped the Lipitor. They deny any issues with myalgias or history of liver damage from it. They deny any focal numbness or weakness or chest pain.   Patient is coming in for recheck of vitamin D and vitamin B12, as well as low previously and will recheck to see where his levels are, he continues to take it.  Relevant past medical, surgical, family and social history reviewed and updated as indicated. Interim medical history since our last visit reviewed. Allergies and medications reviewed and updated.  Review of Systems  Constitutional: Negative for chills and fever.  Eyes: Negative for visual disturbance.  Respiratory: Negative for shortness of breath and wheezing.   Cardiovascular: Negative for chest pain and  leg swelling.  Musculoskeletal: Negative for back pain and gait problem.  Skin: Negative for rash.  Neurological: Negative for dizziness, weakness and light-headedness.  All other systems reviewed and are negative.   Per HPI unless specifically indicated above   Allergies as of 09/30/2019      Reactions   Penicillins       Medication List       Accurate as of September 30, 2019  8:23 AM. If you have any questions, ask your nurse or doctor.        STOP taking these medications   atorvastatin 20 MG tablet Commonly known as: LIPITOR Stopped by: Worthy Rancher, MD   Vitamin D 50 MCG (2000 UT) Caps Stopped by: Fransisca Kaufmann Emmerich Cryer, MD     TAKE these medications   Accu-Chek Aviva Plus w/Device Kit 1 each by Does not apply route daily.   Breo Ellipta 100-25 MCG/INH Aepb Generic drug: fluticasone furoate-vilanterol INHALE 1 PUFF ORALLY ONCE DAILY.   furosemide 20 MG tablet Commonly known as: LASIX Take 1 tablet (20 mg total) by mouth daily.   glipiZIDE 5 MG tablet Commonly known as: GLUCOTROL Take 1 tablet (5 mg total) by mouth 2 (two) times daily.   glucose blood test strip Commonly known as: Accu-Chek Aviva Plus Test bid. E11.9   lisinopril 5 MG tablet Commonly known as: ZESTRIL Take 1 tablet (5 mg total) by mouth daily.   metFORMIN 500 MG tablet Commonly known as: GLUCOPHAGE Take 1 tablet (500  mg total) by mouth 2 (two) times daily.   metoprolol tartrate 50 MG tablet Commonly known as: LOPRESSOR Take 1 tablet (50 mg total) by mouth 2 (two) times daily.   pravastatin 20 MG tablet Commonly known as: PRAVACHOL Take 1 tablet (20 mg total) by mouth daily. Started by: Fransisca Kaufmann Aundrea Higginbotham, MD   sitaGLIPtin 100 MG tablet Commonly known as: Januvia Take 1 tablet (100 mg total) by mouth daily.   Spiriva HandiHaler 18 MCG inhalation capsule Generic drug: tiotropium PLACE 1 CAPSULE INTO INHALER AND INHALE DAILY.   VITAMIN B 12 PO Take 1,000 mg by mouth  daily.        Objective:   BP (!) 149/82   Pulse 81   Temp (!) 97.5 F (36.4 C) (Temporal)   Ht 5' 9"  (1.753 m)   Wt 204 lb 3.2 oz (92.6 kg)   SpO2 99%   BMI 30.16 kg/m   Wt Readings from Last 3 Encounters:  09/30/19 204 lb 3.2 oz (92.6 kg)  12/10/18 195 lb 6.4 oz (88.6 kg)  09/05/18 194 lb 6.4 oz (88.2 kg)    Physical Exam Vitals signs and nursing note reviewed.  Constitutional:      General: He is not in acute distress.    Appearance: He is well-developed. He is not diaphoretic.  Eyes:     General: No scleral icterus.    Conjunctiva/sclera: Conjunctivae normal.  Neck:     Musculoskeletal: Neck supple.     Thyroid: No thyromegaly.  Cardiovascular:     Rate and Rhythm: Normal rate and regular rhythm.     Heart sounds: Normal heart sounds. No murmur.  Pulmonary:     Effort: Pulmonary effort is normal. No respiratory distress.     Breath sounds: Normal breath sounds. No wheezing.  Musculoskeletal: Normal range of motion.  Lymphadenopathy:     Cervical: No cervical adenopathy.  Skin:    General: Skin is warm and dry.     Findings: No rash.  Neurological:     Mental Status: He is alert and oriented to person, place, and time.     Coordination: Coordination normal.  Psychiatric:        Behavior: Behavior normal.       Assessment & Plan:   Problem List Items Addressed This Visit      Cardiovascular and Mediastinum   Hypertension associated with diabetes ([place])   Relevant Medications   lisinopril (ZESTRIL) 5 MG tablet   metoprolol tartrate (LOPRESSOR) 50 MG tablet   metFORMIN (GLUCOPHAGE) 500 MG tablet   sitaGLIPtin (JANUVIA) 100 MG tablet   glipiZIDE (GLUCOTROL) 5 MG tablet   pravastatin (PRAVACHOL) 20 MG tablet     Endocrine   Hyperlipidemia associated with type 2 diabetes mellitus (HCC)   Relevant Medications   lisinopril (ZESTRIL) 5 MG tablet   metFORMIN (GLUCOPHAGE) 500 MG tablet   sitaGLIPtin (JANUVIA) 100 MG tablet   glipiZIDE (GLUCOTROL) 5 MG  tablet   pravastatin (PRAVACHOL) 20 MG tablet   Other Relevant Orders   Lipid panel   Type 2 diabetes mellitus with other specified complication (HCC)   Relevant Medications   lisinopril (ZESTRIL) 5 MG tablet   metFORMIN (GLUCOPHAGE) 500 MG tablet   sitaGLIPtin (JANUVIA) 100 MG tablet   glipiZIDE (GLUCOTROL) 5 MG tablet   pravastatin (PRAVACHOL) 20 MG tablet   Other Relevant Orders   CBC with Differential/Platelet   CMP14+EGFR   Bayer DCA Hb A1c Waived     Other   Vitamin  D deficiency   Relevant Orders   VITAMIN D 25 Hydroxy (Vit-D Deficiency, Fractures)   Vitamin B 12 deficiency   Relevant Orders   Vitamin B12   Overweight (BMI 25.0-29.9) - Primary      Continue current medication, no change for diabetes or blood pressure, allowing permissive hypertension. We will start pravastatin because he did not do well with Lipitor. Follow up plan: Return in about 3 months (around 12/31/2019), or if symptoms worsen or fail to improve, for Hypertension and diabetes.  Counseling provided for all of the vaccine components Orders Placed This Encounter  Procedures  . CBC with Differential/Platelet  . CMP14+EGFR  . Lipid panel  . Bayer DCA Hb A1c Waived  . Vitamin B12  . VITAMIN D 25 Hydroxy (Vit-D Deficiency, Fractures)    Caryl Pina, MD [facility] 09/30/2019, 8:23 AM

## 2019-09-30 NOTE — Addendum Note (Signed)
Addended by: Earlene Plater on: 09/30/2019 08:32 AM   Modules accepted: Orders

## 2019-09-30 NOTE — Addendum Note (Signed)
Addended by: Karle Plumber on: 09/30/2019 08:35 AM   Modules accepted: Orders

## 2019-10-01 LAB — LIPID PANEL
Chol/HDL Ratio: 4 ratio (ref 0.0–5.0)
Cholesterol, Total: 164 mg/dL (ref 100–199)
HDL: 41 mg/dL (ref >39)
LDL Chol Calc (NIH): 107 mg/dL — ABNORMAL HIGH (ref 0–99)
Triglycerides: 85 mg/dL (ref 0–149)
VLDL Cholesterol Cal: 16 mg/dL (ref 5–40)

## 2019-10-01 LAB — CMP14+EGFR
ALT: 25 IU/L (ref 0–44)
AST: 19 IU/L (ref 0–40)
Albumin/Globulin Ratio: 1.7 (ref 1.2–2.2)
Albumin: 4.3 g/dL (ref 3.7–4.7)
Alkaline Phosphatase: 58 IU/L (ref 39–117)
BUN/Creatinine Ratio: 9 — ABNORMAL LOW (ref 10–24)
BUN: 11 mg/dL (ref 8–27)
Bilirubin Total: 0.4 mg/dL (ref 0.0–1.2)
CO2: 24 mmol/L (ref 20–29)
Calcium: 9.4 mg/dL (ref 8.6–10.2)
Chloride: 96 mmol/L (ref 96–106)
Creatinine, Ser: 1.18 mg/dL (ref 0.76–1.27)
GFR calc Af Amer: 70 mL/min/{1.73_m2} (ref >59)
GFR calc non Af Amer: 60 mL/min/{1.73_m2} (ref >59)
Globulin, Total: 2.6 g/dL (ref 1.5–4.5)
Glucose: 173 mg/dL — ABNORMAL HIGH (ref 65–99)
Potassium: 4.6 mmol/L (ref 3.5–5.2)
Sodium: 134 mmol/L (ref 134–144)
Total Protein: 6.9 g/dL (ref 6.0–8.5)

## 2019-10-01 LAB — CBC WITH DIFFERENTIAL/PLATELET
Basophils Absolute: 0 10*3/uL (ref 0.0–0.2)
Basos: 0 %
EOS (ABSOLUTE): 0.4 10*3/uL (ref 0.0–0.4)
Eos: 6 %
Hematocrit: 40.8 % (ref 37.5–51.0)
Hemoglobin: 14.2 g/dL (ref 13.0–17.7)
Immature Grans (Abs): 0 10*3/uL (ref 0.0–0.1)
Immature Granulocytes: 1 %
Lymphocytes Absolute: 1.6 10*3/uL (ref 0.7–3.1)
Lymphs: 23 %
MCH: 34 pg — ABNORMAL HIGH (ref 26.6–33.0)
MCHC: 34.8 g/dL (ref 31.5–35.7)
MCV: 98 fL — ABNORMAL HIGH (ref 79–97)
Monocytes Absolute: 0.8 10*3/uL (ref 0.1–0.9)
Monocytes: 12 %
Neutrophils Absolute: 4 10*3/uL (ref 1.4–7.0)
Neutrophils: 58 %
Platelets: 224 10*3/uL (ref 150–450)
RBC: 4.18 x10E6/uL (ref 4.14–5.80)
RDW: 12.3 % (ref 11.6–15.4)
WBC: 6.8 10*3/uL (ref 3.4–10.8)

## 2019-10-01 LAB — VITAMIN D 25 HYDROXY (VIT D DEFICIENCY, FRACTURES): Vit D, 25-Hydroxy: 49.1 ng/mL (ref 30.0–100.0)

## 2019-10-01 LAB — VITAMIN B12: Vitamin B-12: 707 pg/mL (ref 232–1245)

## 2019-10-07 DIAGNOSIS — E119 Type 2 diabetes mellitus without complications: Secondary | ICD-10-CM | POA: Diagnosis not present

## 2019-10-07 DIAGNOSIS — H40033 Anatomical narrow angle, bilateral: Secondary | ICD-10-CM | POA: Diagnosis not present

## 2019-10-14 ENCOUNTER — Telehealth: Payer: Self-pay | Admitting: Family Medicine

## 2019-10-14 DIAGNOSIS — E1169 Type 2 diabetes mellitus with other specified complication: Secondary | ICD-10-CM

## 2019-10-14 MED ORDER — METFORMIN HCL 500 MG PO TABS: 1000.0000 mg | ORAL_TABLET | Freq: Two times a day (BID) | ORAL | 3 refills | Status: DC

## 2019-10-14 NOTE — Telephone Encounter (Signed)
Please let him know that I sent in a new prescription of the metformin so he should have enough

## 2019-10-14 NOTE — Telephone Encounter (Signed)
Network difficulties with phone will try again latter

## 2019-10-15 ENCOUNTER — Other Ambulatory Visit: Payer: Self-pay | Admitting: Family Medicine

## 2019-10-15 DIAGNOSIS — J439 Emphysema, unspecified: Secondary | ICD-10-CM

## 2019-12-24 ENCOUNTER — Other Ambulatory Visit: Payer: Self-pay | Admitting: Family Medicine

## 2019-12-24 DIAGNOSIS — J439 Emphysema, unspecified: Secondary | ICD-10-CM

## 2020-01-09 ENCOUNTER — Other Ambulatory Visit: Payer: Self-pay

## 2020-01-09 ENCOUNTER — Telehealth: Payer: Self-pay | Admitting: Family Medicine

## 2020-01-10 ENCOUNTER — Encounter: Payer: Self-pay | Admitting: Family Medicine

## 2020-01-10 ENCOUNTER — Ambulatory Visit (INDEPENDENT_AMBULATORY_CARE_PROVIDER_SITE_OTHER): Payer: Medicare Other | Admitting: Family Medicine

## 2020-01-10 VITALS — BP 149/80 | HR 94 | Temp 97.3°F | Ht 69.0 in | Wt 195.2 lb

## 2020-01-10 DIAGNOSIS — I10 Essential (primary) hypertension: Secondary | ICD-10-CM

## 2020-01-10 DIAGNOSIS — E1159 Type 2 diabetes mellitus with other circulatory complications: Secondary | ICD-10-CM | POA: Diagnosis not present

## 2020-01-10 DIAGNOSIS — E785 Hyperlipidemia, unspecified: Secondary | ICD-10-CM

## 2020-01-10 DIAGNOSIS — J439 Emphysema, unspecified: Secondary | ICD-10-CM

## 2020-01-10 DIAGNOSIS — E1169 Type 2 diabetes mellitus with other specified complication: Secondary | ICD-10-CM | POA: Diagnosis not present

## 2020-01-10 LAB — BAYER DCA HB A1C WAIVED: HB A1C (BAYER DCA - WAIVED): 6.6 % (ref <7.0)

## 2020-01-10 NOTE — Progress Notes (Signed)
BP (!) 149/80   Pulse 94   Temp (!) 97.3 F (36.3 C) (Temporal)   Ht 5' 9"  (1.753 m)   Wt 195 lb 4 oz (88.6 kg)   SpO2 98%   BMI 28.83 kg/m    Subjective:   Patient ID: Ray Carlson, male    DOB: 08/23/1946, 74 y.o.   MRN: [id number]  HPI: Ray Carlson is a 74 y.o. male presenting on 01/10/2020 for Medical Management of Chronic Issues   HPI Type 2 diabetes mellitus Patient comes in today for recheck of his diabetes. Patient has been currently taking glipizide and Metformin and Januvia. Patient is currently on an ACE inhibitor/ARB. Patient has not seen an ophthalmologist this year. Patient denies any issues with their feet.   Hypertension Patient is currently on lisinopril and metoprolol, and their blood pressure today is 149/80. Patient denies any lightheadedness or dizziness. Patient denies headaches, blurred vision, chest pains, shortness of breath, or weakness. Denies any side effects from medication and is content with current medication.   Hyperlipidemia Patient is coming in for recheck of his hyperlipidemia. The patient is currently taking pravastatin. They deny any issues with myalgias or history of liver damage from it. They deny any focal numbness or weakness or chest pain.   COPD Patient is coming in for COPD recheck today.  He is currently on spiriva.  He has a mild chronic cough but denies any major coughing spells or wheezing spells.  He has 0nighttime symptoms per week and 0daytime symptoms per week currently.   Relevant past medical, surgical, family and social history reviewed and updated as indicated. Interim medical history since our last visit reviewed. Allergies and medications reviewed and updated.  Review of Systems  Constitutional: Negative for chills and fever.  Respiratory: Negative for shortness of breath and wheezing.   Cardiovascular: Negative for chest pain and leg swelling.  Musculoskeletal: Negative for back pain and gait problem.  Skin: Negative  for rash.  Neurological: Negative for dizziness, weakness and light-headedness.  All other systems reviewed and are negative.   Per HPI unless specifically indicated above   Allergies as of 01/10/2020      Reactions   Penicillins       Medication List       Accurate as of January 10, 2020  8:26 AM. If you have any questions, ask your nurse or doctor.        Accu-Chek Aviva Plus w/Device Kit 1 each by Does not apply route daily.   Breo Ellipta 100-25 MCG/INH Aepb Generic drug: fluticasone furoate-vilanterol INHALE 1 PUFF ORALLY ONCE DAILY.   furosemide 20 MG tablet Commonly known as: LASIX Take 1 tablet (20 mg total) by mouth daily.   glipiZIDE 5 MG tablet Commonly known as: GLUCOTROL Take 1 tablet (5 mg total) by mouth 2 (two) times daily.   glucose blood test strip Commonly known as: Accu-Chek Aviva Plus Test bid. E11.9   lisinopril 5 MG tablet Commonly known as: ZESTRIL Take 1 tablet (5 mg total) by mouth daily.   metFORMIN 500 MG tablet Commonly known as: GLUCOPHAGE Take 2 tablets (1,000 mg total) by mouth 2 (two) times daily.   metoprolol tartrate 50 MG tablet Commonly known as: LOPRESSOR Take 1 tablet (50 mg total) by mouth 2 (two) times daily.   pravastatin 20 MG tablet Commonly known as: PRAVACHOL Take 1 tablet (20 mg total) by mouth daily.   sitaGLIPtin 100 MG tablet Commonly known as: Januvia Take 1  tablet (100 mg total) by mouth daily.   Spiriva HandiHaler 18 MCG inhalation capsule Generic drug: tiotropium PLACE 1 CAPSULE INTO INHALER AND INHALE DAILY.   VITAMIN B 12 PO Take 1,000 mg by mouth daily.        Objective:   BP (!) 149/80   Pulse 94   Temp (!) 97.3 F (36.3 C) (Temporal)   Ht 5' 9"  (1.753 m)   Wt 195 lb 4 oz (88.6 kg)   SpO2 98%   BMI 28.83 kg/m   Wt Readings from Last 3 Encounters:  01/10/20 195 lb 4 oz (88.6 kg)  09/30/19 204 lb 3.2 oz (92.6 kg)  12/10/18 195 lb 6.4 oz (88.6 kg)    Physical Exam Vitals and  nursing note reviewed.  Constitutional:      General: He is not in acute distress.    Appearance: He is well-developed. He is not diaphoretic.  Eyes:     General: No scleral icterus.    Conjunctiva/sclera: Conjunctivae normal.  Neck:     Thyroid: No thyromegaly.  Cardiovascular:     Rate and Rhythm: Normal rate and regular rhythm.     Heart sounds: Normal heart sounds. No murmur.  Pulmonary:     Effort: Pulmonary effort is normal. No respiratory distress.     Breath sounds: Normal breath sounds. No wheezing.  Musculoskeletal:        General: Normal range of motion.     Cervical back: Neck supple.  Lymphadenopathy:     Cervical: No cervical adenopathy.  Skin:    General: Skin is warm and dry.     Findings: No rash.  Neurological:     Mental Status: He is alert and oriented to person, place, and time.     Coordination: Coordination normal.  Psychiatric:        Behavior: Behavior normal.       Assessment & Plan:   Problem List Items Addressed This Visit      Cardiovascular and Mediastinum   Hypertension associated with diabetes ([place])     Respiratory   COPD (chronic obstructive pulmonary disease) (HCC)     Endocrine   Hyperlipidemia associated with type 2 diabetes mellitus ([place])   Type 2 diabetes mellitus with other specified complication ([place]) - Primary   Relevant Orders   Bayer DCA Hb A1c Waived    A1c is 6.6, is doing pretty good, he says his breathing is doing well, no change in medication, allowing permissive hypertension because of age, he says at home it is running 130s over 80s  Follow up plan: Return in about 3 months (around 04/09/2020), or if symptoms worsen or fail to improve, for Diabetes and hypertension cholesterol.  Counseling provided for all of the vaccine components Orders Placed This Encounter  Procedures  . Bayer [facility] Hb A1c Ward, MD [facility] 01/10/2020, 8:26 AM

## 2020-01-24 ENCOUNTER — Telehealth: Payer: Self-pay | Admitting: Family Medicine

## 2020-01-24 NOTE — Chronic Care Management (AMB) (Signed)
  Chronic Care Management   Note  01/24/2020 Name: Ray Carlson MRN: [id number] DOB: [birth year]  Ray Carlson is a 74 y.o. year old male who is a primary care patient of Dettinger, Fransisca Kaufmann, MD. I reached out to Ray Carlson by phone today in response to a referral sent by Ray Carlson's health plan.     Ray Carlson was given information about Chronic Care Management services today including:  1. CCM service includes personalized support from designated clinical staff supervised by his physician, including individualized plan of care and coordination with other care providers 2. 24/7 contact phone numbers for assistance for urgent and routine care needs. 3. Service will only be billed when office clinical staff spend 20 minutes or more in a month to coordinate care. 4. Only one practitioner may furnish and bill the service in a calendar month. 5. The patient may stop CCM services at any time (effective at the end of the month) by phone call to the office staff. 6. The patient will be responsible for cost sharing (co-pay) of up to 20% of the service fee (after annual deductible is met).  Patient agreed to services and verbal consent obtained.   Follow up plan: Telephone appointment with care management team member scheduled for:02/14/2020  Noreene Larsson, [place], [place], [place] [address] Direct Dial: [phone] [email or web address] Website: [email or web address]

## 2020-01-29 LAB — HM DIABETES EYE EXAM

## 2020-02-14 ENCOUNTER — Telehealth: Payer: Medicare Other

## 2020-02-26 ENCOUNTER — Other Ambulatory Visit: Payer: Self-pay | Admitting: Family Medicine

## 2020-02-26 DIAGNOSIS — J439 Emphysema, unspecified: Secondary | ICD-10-CM

## 2020-02-27 ENCOUNTER — Other Ambulatory Visit: Payer: Self-pay | Admitting: Family Medicine

## 2020-02-27 DIAGNOSIS — J439 Emphysema, unspecified: Secondary | ICD-10-CM

## 2020-03-11 ENCOUNTER — Ambulatory Visit: Payer: Medicare Other | Admitting: *Deleted

## 2020-03-11 NOTE — Chronic Care Management (AMB) (Signed)
  Chronic Care Management   Outreach Note  03/11/2020 Name: Ray Carlson MRN: [id number] DOB: December 07, 1946  Referred by: Dettinger, Elige Radon, MD Reason for referral : Chronic Care Management (Initial Visit)   An unsuccessful Initial Telephone Visit was attempted today. The patient was referred to the case management team for assistance with care management and care coordination.   Follow Up Plan: A HIPPA compliant phone message was left for the patient providing contact information and requesting a return call.   A message will be sent to the [facility] Care Guides requesting that they contact [name] to reschedule his Initial Visit.   Demetrios Loll, BSN, RN-BC Embedded Chronic Care Manager [facility] Care Management Direct Dial: [phone]

## 2020-03-12 ENCOUNTER — Telehealth: Payer: Self-pay | Admitting: Family Medicine

## 2020-03-12 NOTE — Chronic Care Management (AMB) (Signed)
  Care Management   Note  03/12/2020 Name: Ray Carlson MRN: [id number] DOB: 19-Oct-1946  [name] is a 74 y.o. year old male who is a primary care patient of Dettinger, Elige Radon, MD and is actively engaged with the care management team. I reached out to [name] by phone today to assist with re-scheduling an initial visit with the RN Case Manager  Follow up plan: The care management team will reach out to the patient again over the next 7 days.  If patient returns call to provider office, please advise to call Embedded Care Management Care Guide Penne Lash  at [phone]  Penne Lash, RMA Care Guide, Embedded Care Coordination [facility]  [place], [place] [address] Direct Dial: [phone] [email or web address] Website: [email or web address]

## 2020-03-12 NOTE — Chronic Care Management (AMB) (Signed)
  Care Management   Note  03/12/2020 Name: [name] MRN: [id number] DOB: 08-10-1946  Ray Carlson is a 74 y.o. year old male who is a primary care patient of Dettinger, Elige Radon, MD and is actively engaged with the care management team. I reached out to Ray Carlson by phone today to assist with re-scheduling an initial visit with the RN Case Manager  Follow up plan: Unsuccessful telephone outreach attempt made. If patient returns call to provider office, please advise to call Embedded Care Management Care Guide Gwenevere Ghazi at [phone].  Gwenevere Ghazi  Care Guide, Embedded Care Coordination [facility]  [place], [place] [address] Direct Dial: [phone] [email or web address] Website: [email or web address]

## 2020-03-17 NOTE — Chronic Care Management (AMB) (Signed)
  Chronic Care Management   Outreach Note  03/17/2020 Name: Ray Carlson MRN: [id number] DOB: 03/04/1946  Ray Carlson is a 74 y.o. year old male who is a primary care patient of Dettinger, Elige Radon, MD. I reached out to Ray Carlson by phone today in response to a referral sent by [name]'s health plan.     A second unsuccessful telephone outreach was attempted today. The patient was referred to the case management team for assistance with care management and care coordination.   Follow Up Plan: The care management team will reach out to the patient again over the next 7 days. If patient returns call to provider office, please advise to call Embedded Care Management Care Guide Gwenevere Ghazi at [phone].  Gwenevere Ghazi  Care Guide, Embedded Care Coordination [facility]  [place], [place] [address] Direct Dial: [phone] [email or web address] Website: [email or web address]

## 2020-03-20 NOTE — Chronic Care Management (AMB) (Signed)
  Care Management   Note  03/20/2020 Name: Ray Carlson MRN: [id number] DOB: 1946-11-16  [name] is a 74 y.o. year old male who is a primary care patient of Dettinger, Elige Radon, MD and is actively engaged with the care management team. I reached out to [name] by phone today to assist with re-scheduling an initial visit with the RN Case Manager  Follow up plan: Unsuccessful telephone outreach attempt made. A HIPPA compliant phone message was left for the patient providing contact information and requesting a return call. Unable to make contact on outreach attempts x 3. PCP Dr. Louanne Skye notified via routed documentation in medical record. If patient returns call to provider office, please advise to call Embedded Care Management Care Guide Gwenevere Ghazi at [phone].  Gwenevere Ghazi  Care Guide, Embedded Care Coordination [facility]  [place], [place] [address] Direct Dial: [phone] [email or web address] Website: [email or web address]

## 2020-04-10 ENCOUNTER — Other Ambulatory Visit: Payer: Self-pay

## 2020-04-10 ENCOUNTER — Ambulatory Visit (INDEPENDENT_AMBULATORY_CARE_PROVIDER_SITE_OTHER): Payer: Medicare Other | Admitting: Family Medicine

## 2020-04-10 ENCOUNTER — Encounter: Payer: Self-pay | Admitting: Family Medicine

## 2020-04-10 VITALS — BP 150/90 | HR 84 | Temp 98.1°F | Ht 72.0 in | Wt 188.0 lb

## 2020-04-10 DIAGNOSIS — E1169 Type 2 diabetes mellitus with other specified complication: Secondary | ICD-10-CM

## 2020-04-10 DIAGNOSIS — J439 Emphysema, unspecified: Secondary | ICD-10-CM

## 2020-04-10 DIAGNOSIS — E785 Hyperlipidemia, unspecified: Secondary | ICD-10-CM | POA: Diagnosis not present

## 2020-04-10 DIAGNOSIS — I10 Essential (primary) hypertension: Secondary | ICD-10-CM

## 2020-04-10 DIAGNOSIS — E1159 Type 2 diabetes mellitus with other circulatory complications: Secondary | ICD-10-CM | POA: Diagnosis not present

## 2020-04-10 LAB — BAYER DCA HB A1C WAIVED: HB A1C (BAYER DCA - WAIVED): 6.7 % (ref <7.0)

## 2020-04-10 NOTE — Progress Notes (Signed)
BP (!) 150/90   Pulse 84   Temp 98.1 F (36.7 C)   Ht 6' (1.829 m)   Wt 188 lb (85.3 kg)   SpO2 94%   BMI 25.50 kg/m    Subjective:   Patient ID: Ray Carlson, male    DOB: 02/03/1946, 74 y.o.   MRN: [id number]  HPI: Ray Carlson is a 74 y.o. male presenting on 04/10/2020 for Medical Management of Chronic Issues and Diabetes   HPI Type 2 diabetes mellitus Patient comes in today for recheck of his diabetes. Patient has been currently taking glipizide Metformin and Januvia. Patient is currently on an ACE inhibitor/ARB. Patient has not seen an ophthalmologist this year. Patient denies any issues with their feet.  Patient hemoglobin A1c 6.7 today.  Hypertension Patient is currently on lisinopril and metoprolol, and their blood pressure today is 150/90. Patient denies any lightheadedness or dizziness. Patient denies headaches, blurred vision, chest pains, shortness of breath, or weakness. Denies any side effects from medication and is content with current medication.   Hyperlipidemia Patient is coming in for recheck of his hyperlipidemia. The patient is currently taking pravastatin. They deny any issues with myalgias or history of liver damage from it. They deny any focal numbness or weakness or chest pain.   COPD Patient is coming in for COPD recheck today.  He is currently on Breo and Spiriva.  He has a mild chronic cough but denies any major coughing spells or wheezing spells.  He has 2nighttime symptoms per week and 3daytime symptoms per week currently.  He does say his allergies are flared up but he says is mostly at the top and not down in his lungs that he is feeling.  He does not feel any wheezing or shortness of breath more than normal.  Relevant past medical, surgical, family and social history reviewed and updated as indicated. Interim medical history since our last visit reviewed. Allergies and medications reviewed and updated.  Review of Systems  Constitutional: Negative for  chills and fever.  Respiratory: Negative for cough, shortness of breath and wheezing.   Cardiovascular: Negative for chest pain, palpitations and leg swelling.  Musculoskeletal: Negative for back pain and gait problem.  Skin: Negative for rash.  Neurological: Negative for dizziness, weakness and numbness.  All other systems reviewed and are negative.   Per HPI unless specifically indicated above   Allergies as of 04/10/2020      Reactions   Penicillins       Medication List       Accurate as of April 10, 2020  8:34 AM. If you have any questions, ask your nurse or doctor.        Accu-Chek Aviva Plus w/Device Kit 1 each by Does not apply route daily.   Breo Ellipta 100-25 MCG/INH Aepb Generic drug: fluticasone furoate-vilanterol INHALE 1 PUFF ORALLY ONCE DAILY.   furosemide 20 MG tablet Commonly known as: LASIX Take 1 tablet (20 mg total) by mouth daily.   glipiZIDE 5 MG tablet Commonly known as: GLUCOTROL TAKE ONE TABLET TWICE A DAY.   glucose blood test strip Commonly known as: Accu-Chek Aviva Plus Test bid. E11.9   lisinopril 5 MG tablet Commonly known as: ZESTRIL Take 1 tablet (5 mg total) by mouth daily.   metFORMIN 500 MG tablet Commonly known as: GLUCOPHAGE Take 2 tablets (1,000 mg total) by mouth 2 (two) times daily.   metoprolol tartrate 50 MG tablet Commonly known as: LOPRESSOR Take 1 tablet (50  mg total) by mouth 2 (two) times daily.   pravastatin 20 MG tablet Commonly known as: PRAVACHOL Take 1 tablet (20 mg total) by mouth daily.   sitaGLIPtin 100 MG tablet Commonly known as: Januvia Take 1 tablet (100 mg total) by mouth daily.   Spiriva HandiHaler 18 MCG inhalation capsule Generic drug: tiotropium PLACE 1 CAPSULE INTO INHALER AND INHALE DAILY.   VITAMIN B 12 PO Take 1,000 mg by mouth daily.        Objective:   BP (!) 150/90   Pulse 84   Temp 98.1 F (36.7 C)   Ht 6' (1.829 m)   Wt 188 lb (85.3 kg)   SpO2 94%   BMI 25.50  kg/m   Wt Readings from Last 3 Encounters:  04/10/20 188 lb (85.3 kg)  01/10/20 195 lb 4 oz (88.6 kg)  09/30/19 204 lb 3.2 oz (92.6 kg)    Physical Exam Vitals and nursing note reviewed.  Constitutional:      General: He is not in acute distress.    Appearance: He is well-developed. He is not diaphoretic.  Eyes:     General: No scleral icterus.    Conjunctiva/sclera: Conjunctivae normal.  Neck:     Thyroid: No thyromegaly.  Cardiovascular:     Rate and Rhythm: Normal rate and regular rhythm.     Heart sounds: Normal heart sounds. No murmur.  Pulmonary:     Effort: Pulmonary effort is normal. No respiratory distress.     Breath sounds: Rhonchi present. No wheezing.  Musculoskeletal:        General: Normal range of motion.     Cervical back: Neck supple.  Lymphadenopathy:     Cervical: No cervical adenopathy.  Skin:    General: Skin is warm and dry.     Findings: No rash.  Neurological:     Mental Status: He is alert and oriented to person, place, and time.     Coordination: Coordination normal.  Psychiatric:        Behavior: Behavior normal.       Assessment & Plan:   Problem List Items Addressed This Visit      Cardiovascular and Mediastinum   Hypertension associated with diabetes ([place])     Respiratory   COPD (chronic obstructive pulmonary disease) (HCC)     Endocrine   Hyperlipidemia associated with type 2 diabetes mellitus ([place])   Type 2 diabetes mellitus with other specified complication ([place]) - Primary   Relevant Orders   Bayer DCA Hb A1c Waived    A1c is 6.7 Continue current medication Blood pressure he says runs better at home than it is in the office, will allow permissive hypertension likely due to whitecoat at this point. Patient denies any shortness of breath has a little bit of rhonchi, he is treating his allergies with over-the-counter medicines.  Follow up plan: Return in about 3 months (around 07/10/2020), or if symptoms worsen or fail to  improve, for Diabetes recheck.  Counseling provided for all of the vaccine components Orders Placed This Encounter  Procedures  . Bayer [facility] Hb A1c Walker, MD [facility] 04/10/2020, 8:34 AM

## 2020-04-10 NOTE — Addendum Note (Signed)
Addended by: Arville Care on: 04/10/2020 08:37 AM   Modules accepted: Orders

## 2020-04-11 LAB — CBC WITH DIFFERENTIAL/PLATELET
Basophils Absolute: 0.1 10*3/uL (ref 0.0–0.2)
Basos: 1 %
EOS (ABSOLUTE): 0.4 10*3/uL (ref 0.0–0.4)
Eos: 5 %
Hematocrit: 40.8 % (ref 37.5–51.0)
Hemoglobin: 13.9 g/dL (ref 13.0–17.7)
Immature Grans (Abs): 0 10*3/uL (ref 0.0–0.1)
Immature Granulocytes: 1 %
Lymphocytes Absolute: 1.6 10*3/uL (ref 0.7–3.1)
Lymphs: 22 %
MCH: 32.8 pg (ref 26.6–33.0)
MCHC: 34.1 g/dL (ref 31.5–35.7)
MCV: 96 fL (ref 79–97)
Monocytes Absolute: 0.9 10*3/uL (ref 0.1–0.9)
Monocytes: 11 %
Neutrophils Absolute: 4.6 10*3/uL (ref 1.4–7.0)
Neutrophils: 60 %
Platelets: 251 10*3/uL (ref 150–450)
RBC: 4.24 x10E6/uL (ref 4.14–5.80)
RDW: 12.9 % (ref 11.6–15.4)
WBC: 7.6 10*3/uL (ref 3.4–10.8)

## 2020-04-11 LAB — CMP14+EGFR
ALT: 27 IU/L (ref 0–44)
AST: 18 IU/L (ref 0–40)
Albumin/Globulin Ratio: 1.5 (ref 1.2–2.2)
Albumin: 4.2 g/dL (ref 3.7–4.7)
Alkaline Phosphatase: 54 IU/L (ref 39–117)
BUN/Creatinine Ratio: 13 (ref 10–24)
BUN: 15 mg/dL (ref 8–27)
Bilirubin Total: 0.4 mg/dL (ref 0.0–1.2)
CO2: 24 mmol/L (ref 20–29)
Calcium: 9.6 mg/dL (ref 8.6–10.2)
Chloride: 96 mmol/L (ref 96–106)
Creatinine, Ser: 1.15 mg/dL (ref 0.76–1.27)
GFR calc Af Amer: 72 mL/min/{1.73_m2} (ref >59)
GFR calc non Af Amer: 62 mL/min/{1.73_m2} (ref >59)
Globulin, Total: 2.8 g/dL (ref 1.5–4.5)
Glucose: 145 mg/dL — ABNORMAL HIGH (ref 65–99)
Potassium: 4.6 mmol/L (ref 3.5–5.2)
Sodium: 135 mmol/L (ref 134–144)
Total Protein: 7 g/dL (ref 6.0–8.5)

## 2020-04-11 LAB — LIPID PANEL
Chol/HDL Ratio: 4.9 ratio (ref 0.0–5.0)
Cholesterol, Total: 180 mg/dL (ref 100–199)
HDL: 37 mg/dL — ABNORMAL LOW (ref >39)
LDL Chol Calc (NIH): 123 mg/dL — ABNORMAL HIGH (ref 0–99)
Triglycerides: 107 mg/dL (ref 0–149)
VLDL Cholesterol Cal: 20 mg/dL (ref 5–40)

## 2020-04-16 ENCOUNTER — Encounter: Payer: Self-pay | Admitting: *Deleted

## 2020-04-27 ENCOUNTER — Encounter: Payer: Self-pay | Admitting: Pharmacist

## 2020-06-01 ENCOUNTER — Telehealth: Payer: Self-pay | Admitting: Family Medicine

## 2020-07-17 ENCOUNTER — Ambulatory Visit (INDEPENDENT_AMBULATORY_CARE_PROVIDER_SITE_OTHER): Payer: Medicare Other | Admitting: Family Medicine

## 2020-07-17 ENCOUNTER — Encounter: Payer: Self-pay | Admitting: Family Medicine

## 2020-07-17 ENCOUNTER — Other Ambulatory Visit: Payer: Self-pay

## 2020-07-17 VITALS — BP 161/85 | HR 87 | Temp 97.7°F | Ht 72.0 in | Wt 193.0 lb

## 2020-07-17 DIAGNOSIS — J439 Emphysema, unspecified: Secondary | ICD-10-CM

## 2020-07-17 DIAGNOSIS — E1140 Type 2 diabetes mellitus with diabetic neuropathy, unspecified: Secondary | ICD-10-CM | POA: Diagnosis not present

## 2020-07-17 DIAGNOSIS — I10 Essential (primary) hypertension: Secondary | ICD-10-CM

## 2020-07-17 DIAGNOSIS — E785 Hyperlipidemia, unspecified: Secondary | ICD-10-CM

## 2020-07-17 DIAGNOSIS — E1169 Type 2 diabetes mellitus with other specified complication: Secondary | ICD-10-CM | POA: Diagnosis not present

## 2020-07-17 DIAGNOSIS — E1159 Type 2 diabetes mellitus with other circulatory complications: Secondary | ICD-10-CM | POA: Diagnosis not present

## 2020-07-17 LAB — BAYER DCA HB A1C WAIVED: HB A1C (BAYER DCA - WAIVED): 6.8 % (ref <7.0)

## 2020-07-17 MED ORDER — LISINOPRIL 10 MG PO TABS: 10.0000 mg | ORAL_TABLET | Freq: Every day | ORAL | 3 refills | Status: DC

## 2020-07-17 NOTE — Progress Notes (Signed)
BP (!) 165/84   Pulse 87   Temp 97.7 F (36.5 C)   Ht 6' (1.829 m)   Wt 193 lb (87.5 kg)   SpO2 96%   BMI 26.18 kg/m    Subjective:   Patient ID: Ray Carlson, male    DOB: 01/12/1946, 74 y.o.   MRN: [id number]  HPI: Ray Carlson is a 74 y.o. male presenting on 07/17/2020 for Medical Management of Chronic Issues and Diabetes   HPI Type 2 diabetes mellitus Patient comes in today for recheck of his diabetes. Patient has been currently taking Metformin and glipizide and Januvia, A1c is 6.8 today. Patient is currently on an ACE inhibitor/ARB. Patient has not seen an ophthalmologist this year. Patient denies any issues with their feet. The symptom started onset as an adult hypertension and hyperlipidemia ARE RELATED TO DM   Hypertension Patient is currently on lisinopril 5 and metoprolol 50, and their blood pressure today is 164/84. Patient denies any lightheadedness or dizziness. Patient denies headaches, blurred vision, chest pains, shortness of breath, or weakness. Denies any side effects from medication and is content with current medication.   Hyperlipidemia Patient is coming in for recheck of his hyperlipidemia. The patient is currently taking pravastatin. They deny any issues with myalgias or history of liver damage from it. They deny any focal numbness or weakness or chest pain.   COPD Patient is coming in for COPD recheck today.  He is currently on [place].  He has a mild chronic cough but denies any major coughing spells or wheezing spells.  He has 3nighttime symptoms per week and 7daytime symptoms per week currently.  Patient says he is at his baseline and denies any major issues with his breathing currently.  He has daily symptoms but he says this is his baseline.  Relevant past medical, surgical, family and social history reviewed and updated as indicated. Interim medical history since our last visit reviewed. Allergies and medications reviewed and updated.  Review of  Systems  Constitutional: Negative for chills and fever.  HENT: Negative for congestion.   Respiratory: Positive for cough and wheezing. Negative for shortness of breath.   Cardiovascular: Negative for chest pain and leg swelling.  Musculoskeletal: Negative for back pain and gait problem.  Skin: Negative for rash.  Neurological: Negative for dizziness, weakness and light-headedness.  All other systems reviewed and are negative.   Per HPI unless specifically indicated above   Allergies as of 07/17/2020      Reactions   Penicillins       Medication List       Accurate as of July 17, 2020  8:17 AM. If you have any questions, ask your nurse or doctor.        Accu-Chek Aviva Plus w/Device Kit 1 each by Does not apply route daily.   Breo Ellipta 100-25 MCG/INH Aepb Generic drug: fluticasone furoate-vilanterol INHALE 1 PUFF ORALLY ONCE DAILY.   furosemide 20 MG tablet Commonly known as: LASIX Take 1 tablet (20 mg total) by mouth daily.   glipiZIDE 5 MG tablet Commonly known as: GLUCOTROL TAKE ONE TABLET TWICE A DAY.   glucose blood test strip Commonly known as: Accu-Chek Aviva Plus Test bid. E11.9   lisinopril 5 MG tablet Commonly known as: ZESTRIL Take 1 tablet (5 mg total) by mouth daily.   metFORMIN 500 MG tablet Commonly known as: GLUCOPHAGE Take 2 tablets (1,000 mg total) by mouth 2 (two) times daily.   metoprolol tartrate  50 MG tablet Commonly known as: LOPRESSOR Take 1 tablet (50 mg total) by mouth 2 (two) times daily.   pravastatin 20 MG tablet Commonly known as: PRAVACHOL Take 1 tablet (20 mg total) by mouth daily.   sitaGLIPtin 100 MG tablet Commonly known as: Januvia Take 1 tablet (100 mg total) by mouth daily.   Spiriva HandiHaler 18 MCG inhalation capsule Generic drug: tiotropium PLACE 1 CAPSULE INTO INHALER AND INHALE DAILY.   VITAMIN B 12 PO Take 1,000 mg by mouth daily.        Objective:   BP (!) 165/84   Pulse 87   Temp 97.7 F  (36.5 C)   Ht 6' (1.829 m)   Wt 193 lb (87.5 kg)   SpO2 96%   BMI 26.18 kg/m   Wt Readings from Last 3 Encounters:  07/17/20 193 lb (87.5 kg)  04/10/20 188 lb (85.3 kg)  01/10/20 195 lb 4 oz (88.6 kg)    Physical Exam Vitals and nursing note reviewed.  Constitutional:      General: He is not in acute distress.    Appearance: He is well-developed. He is not diaphoretic.  Eyes:     General: No scleral icterus.    Conjunctiva/sclera: Conjunctivae normal.  Neck:     Thyroid: No thyromegaly.  Cardiovascular:     Rate and Rhythm: Normal rate and regular rhythm.     Heart sounds: Normal heart sounds. No murmur heard.   Pulmonary:     Effort: Pulmonary effort is normal. No respiratory distress.     Breath sounds: Normal breath sounds. No wheezing.  Musculoskeletal:        General: Normal range of motion.     Cervical back: Neck supple.  Lymphadenopathy:     Cervical: No cervical adenopathy.  Skin:    General: Skin is warm and dry.     Findings: No rash.  Neurological:     Mental Status: He is alert and oriented to person, place, and time.     Coordination: Coordination normal.  Psychiatric:        Behavior: Behavior normal.       Assessment & Plan:   Problem List Items Addressed This Visit      Cardiovascular and Mediastinum   Hypertension associated with diabetes ([place])   Relevant Medications   lisinopril (ZESTRIL) 10 MG tablet   Other Relevant Orders   CMP14+EGFR     Respiratory   COPD (chronic obstructive pulmonary disease) (HCC)   Relevant Orders   CBC with Differential/Platelet     Endocrine   Hyperlipidemia associated with type 2 diabetes mellitus ([place])   Relevant Medications   lisinopril (ZESTRIL) 10 MG tablet   Other Relevant Orders   Lipid panel   Type 2 diabetes mellitus with other specified complication (HCC) - Primary   Relevant Medications   lisinopril (ZESTRIL) 10 MG tablet   Other Relevant Orders   Bayer DCA Hb A1c Waived   CBC with  Differential/Platelet      A1c is 6.8, no medication changes, continue to monitor.  Blood pressure elevated, will increase lisinopril to 10 mg Follow up plan: Return in about 3 months (around 10/17/2020), or if symptoms worsen or fail to improve.  Counseling provided for all of the vaccine components Orders Placed This Encounter  Procedures  . Bayer [facility] Hb A1c [place], MD [facility] 07/17/2020, 8:17 AM

## 2020-07-18 LAB — CBC WITH DIFFERENTIAL/PLATELET
Basophils Absolute: 0 10*3/uL (ref 0.0–0.2)
Basos: 1 %
EOS (ABSOLUTE): 0.3 10*3/uL (ref 0.0–0.4)
Eos: 5 %
Hematocrit: 40 % (ref 37.5–51.0)
Hemoglobin: 13.4 g/dL (ref 13.0–17.7)
Immature Grans (Abs): 0 10*3/uL (ref 0.0–0.1)
Immature Granulocytes: 1 %
Lymphocytes Absolute: 1.4 10*3/uL (ref 0.7–3.1)
Lymphs: 21 %
MCH: 31.4 pg (ref 26.6–33.0)
MCHC: 33.5 g/dL (ref 31.5–35.7)
MCV: 94 fL (ref 79–97)
Monocytes Absolute: 0.9 10*3/uL (ref 0.1–0.9)
Monocytes: 13 %
Neutrophils Absolute: 4.1 10*3/uL (ref 1.4–7.0)
Neutrophils: 59 %
Platelets: 256 10*3/uL (ref 150–450)
RBC: 4.27 x10E6/uL (ref 4.14–5.80)
RDW: 12.9 % (ref 11.6–15.4)
WBC: 6.7 10*3/uL (ref 3.4–10.8)

## 2020-07-18 LAB — CMP14+EGFR
ALT: 24 IU/L (ref 0–44)
AST: 19 IU/L (ref 0–40)
Albumin/Globulin Ratio: 1.4 (ref 1.2–2.2)
Albumin: 4.2 g/dL (ref 3.7–4.7)
Alkaline Phosphatase: 61 IU/L (ref 48–121)
BUN/Creatinine Ratio: 13 (ref 10–24)
BUN: 14 mg/dL (ref 8–27)
Bilirubin Total: 0.5 mg/dL (ref 0.0–1.2)
CO2: 24 mmol/L (ref 20–29)
Calcium: 9.1 mg/dL (ref 8.6–10.2)
Chloride: 94 mmol/L — ABNORMAL LOW (ref 96–106)
Creatinine, Ser: 1.07 mg/dL (ref 0.76–1.27)
GFR calc Af Amer: 78 mL/min/{1.73_m2} (ref >59)
GFR calc non Af Amer: 68 mL/min/{1.73_m2} (ref >59)
Globulin, Total: 2.9 g/dL (ref 1.5–4.5)
Glucose: 158 mg/dL — ABNORMAL HIGH (ref 65–99)
Potassium: 4.5 mmol/L (ref 3.5–5.2)
Sodium: 132 mmol/L — ABNORMAL LOW (ref 134–144)
Total Protein: 7.1 g/dL (ref 6.0–8.5)

## 2020-07-18 LAB — LIPID PANEL
Chol/HDL Ratio: 4.4 ratio (ref 0.0–5.0)
Cholesterol, Total: 173 mg/dL (ref 100–199)
HDL: 39 mg/dL — ABNORMAL LOW (ref >39)
LDL Chol Calc (NIH): 116 mg/dL — ABNORMAL HIGH (ref 0–99)
Triglycerides: 95 mg/dL (ref 0–149)
VLDL Cholesterol Cal: 18 mg/dL (ref 5–40)

## 2020-08-26 ENCOUNTER — Other Ambulatory Visit: Payer: Self-pay | Admitting: Family Medicine

## 2020-08-26 DIAGNOSIS — J439 Emphysema, unspecified: Secondary | ICD-10-CM

## 2020-10-20 ENCOUNTER — Ambulatory Visit (INDEPENDENT_AMBULATORY_CARE_PROVIDER_SITE_OTHER): Payer: Medicare Other | Admitting: Family Medicine

## 2020-10-20 ENCOUNTER — Encounter: Payer: Self-pay | Admitting: Family Medicine

## 2020-10-20 DIAGNOSIS — E1159 Type 2 diabetes mellitus with other circulatory complications: Secondary | ICD-10-CM | POA: Diagnosis not present

## 2020-10-20 DIAGNOSIS — E1169 Type 2 diabetes mellitus with other specified complication: Secondary | ICD-10-CM

## 2020-10-20 DIAGNOSIS — I152 Hypertension secondary to endocrine disorders: Secondary | ICD-10-CM

## 2020-10-20 DIAGNOSIS — J439 Emphysema, unspecified: Secondary | ICD-10-CM | POA: Diagnosis not present

## 2020-10-20 DIAGNOSIS — E785 Hyperlipidemia, unspecified: Secondary | ICD-10-CM

## 2020-10-20 MED ORDER — GLIPIZIDE 5 MG PO TABS: 5.0000 mg | ORAL_TABLET | Freq: Two times a day (BID) | ORAL | 3 refills | Status: DC

## 2020-10-20 MED ORDER — BREO ELLIPTA 100-25 MCG/INH IN AEPB: 1.0000 | INHALATION_SPRAY | Freq: Every day | RESPIRATORY_TRACT | 3 refills | Status: DC

## 2020-10-20 MED ORDER — METOPROLOL TARTRATE 50 MG PO TABS: 50.0000 mg | ORAL_TABLET | Freq: Two times a day (BID) | ORAL | 3 refills | Status: DC

## 2020-10-20 MED ORDER — METFORMIN HCL 500 MG PO TABS: 1000.0000 mg | ORAL_TABLET | Freq: Two times a day (BID) | ORAL | 3 refills | Status: DC

## 2020-10-20 MED ORDER — PRAVASTATIN SODIUM 20 MG PO TABS: 20.0000 mg | ORAL_TABLET | Freq: Every day | ORAL | 3 refills | Status: DC

## 2020-10-20 MED ORDER — SPIRIVA HANDIHALER 18 MCG IN CAPS: 18.0000 ug | ORAL_CAPSULE | Freq: Every day | RESPIRATORY_TRACT | 3 refills | Status: DC

## 2020-10-20 MED ORDER — SITAGLIPTIN PHOSPHATE 100 MG PO TABS: 100.0000 mg | ORAL_TABLET | Freq: Every day | ORAL | 3 refills | Status: DC

## 2020-10-20 NOTE — Progress Notes (Signed)
Virtual Visit via telephone Note  I connected with Ray Carlson on 10/20/20 at [place] by telephone and verified that I am speaking with the correct person using two identifiers. Ray Carlson is currently located at home and patient are currently with her during visit. The provider, Fransisca Kaufmann Burk Hoctor, MD is located in their office at time of visit.  Call ended at [place]  I discussed the limitations, risks, security and privacy concerns of performing an evaluation and management service by telephone and the availability of in person appointments. I also discussed with the patient that there may be a patient responsible charge related to this service. The patient expressed understanding and agreed to proceed.   History and Present Illness: Type 2 diabetes mellitus Patient comes in today for recheck of his diabetes. Patient has been currently taking metformin and januvia and glipizide. Patient is currently on an ACE inhibitor/ARB. Patient has not seen an ophthalmologist this year. Patient denies any issues with their feet. The symptom started onset as an adult htn and hld ARE RELATED TO DM   Hypertension Patient is currently on lisinopril and metoprolol and furosemide, and their blood pressure today is unknown. Patient denies any lightheadedness or dizziness. Patient denies headaches, blurred vision, chest pains, shortness of breath, or weakness. Denies any side effects from medication and is content with current medication.   Hyperlipidemia Patient is coming in for recheck of his hyperlipidemia. The patient is currently taking pravastatin. They deny any issues with myalgias or history of liver damage from it. They deny any focal numbness or weakness or chest pain.   No diagnosis found.  Outpatient Encounter Medications as of 10/20/2020  Medication Sig  . Blood Glucose Monitoring Suppl (ACCU-CHEK AVIVA PLUS) w/Device KIT 1 each by Does not apply route daily.  Marland Kitchen BREO ELLIPTA 100-25 MCG/INH AEPB  INHALE 1 PUFF INTO THE LUNGS DAILY.  Marland Kitchen Cyanocobalamin (VITAMIN B 12 PO) Take 1,000 mg by mouth daily.  . furosemide (LASIX) 20 MG tablet Take 1 tablet (20 mg total) by mouth daily.  Marland Kitchen glipiZIDE (GLUCOTROL) 5 MG tablet TAKE ONE TABLET TWICE A DAY.  Marland Kitchen glucose blood (ACCU-CHEK AVIVA PLUS) test strip Test bid. E11.9  . lisinopril (ZESTRIL) 10 MG tablet Take 1 tablet (10 mg total) by mouth daily.  . metFORMIN (GLUCOPHAGE) 500 MG tablet Take 2 tablets (1,000 mg total) by mouth 2 (two) times daily.  . metoprolol tartrate (LOPRESSOR) 50 MG tablet Take 1 tablet (50 mg total) by mouth 2 (two) times daily.  . pravastatin (PRAVACHOL) 20 MG tablet Take 1 tablet (20 mg total) by mouth daily.  . sitaGLIPtin (JANUVIA) 100 MG tablet Take 1 tablet (100 mg total) by mouth daily.  Marland Kitchen SPIRIVA HANDIHALER 18 MCG inhalation capsule PLACE 1 CAPSULE INTO INHALER AND INHALE DAILY.   No facility-administered encounter medications on file as of 10/20/2020.    Review of Systems  Constitutional: Negative for chills and fever.  Eyes: Negative for visual disturbance.  Respiratory: Negative for shortness of breath and wheezing.   Cardiovascular: Negative for chest pain and leg swelling.  Musculoskeletal: Negative for back pain and gait problem.  Skin: Negative for rash.  Neurological: Negative for dizziness, weakness and light-headedness.  All other systems reviewed and are negative.   Observations/Objective: Patient sounds comfortable and in no acute distress  Assessment and Plan: Problem List Items Addressed This Visit      Cardiovascular and Mediastinum   Hypertension associated with diabetes ([place])   Relevant Medications  glipiZIDE (GLUCOTROL) 5 MG tablet   metFORMIN (GLUCOPHAGE) 500 MG tablet   metoprolol tartrate (LOPRESSOR) 50 MG tablet   pravastatin (PRAVACHOL) 20 MG tablet   sitaGLIPtin (JANUVIA) 100 MG tablet     Respiratory   COPD (chronic obstructive pulmonary disease) (HCC)   Relevant Medications    fluticasone furoate-vilanterol (BREO ELLIPTA) 100-25 MCG/INH AEPB   tiotropium (SPIRIVA HANDIHALER) 18 MCG inhalation capsule     Endocrine   Hyperlipidemia associated with type 2 diabetes mellitus (HCC)   Relevant Medications   glipiZIDE (GLUCOTROL) 5 MG tablet   metFORMIN (GLUCOPHAGE) 500 MG tablet   pravastatin (PRAVACHOL) 20 MG tablet   sitaGLIPtin (JANUVIA) 100 MG tablet   Type 2 diabetes mellitus with other specified complication (HCC) - Primary   Relevant Medications   glipiZIDE (GLUCOTROL) 5 MG tablet   metFORMIN (GLUCOPHAGE) 500 MG tablet   pravastatin (PRAVACHOL) 20 MG tablet   sitaGLIPtin (JANUVIA) 100 MG tablet      Continue current medication, patient can come in because of direct exposure to Covid. Follow up plan: Return in about 3 months (around 01/20/2021), or if symptoms worsen or fail to improve.     I discussed the assessment and treatment plan with the patient. The patient was provided an opportunity to ask questions and all were answered. The patient agreed with the plan and demonstrated an understanding of the instructions.   The patient was advised to call back or seek an in-person evaluation if the symptoms worsen or if the condition fails to improve as anticipated.  The above assessment and management plan was discussed with the patient. The patient verbalized understanding of and has agreed to the management plan. Patient is aware to call the clinic if symptoms persist or worsen. Patient is aware when to return to the clinic for a follow-up visit. Patient educated on when it is appropriate to go to the emergency department.    I provided 11 minutes of non-face-to-face time during this encounter.    Worthy Rancher, MD

## 2020-12-30 ENCOUNTER — Other Ambulatory Visit: Payer: Self-pay

## 2020-12-30 ENCOUNTER — Encounter: Payer: Self-pay | Admitting: Family Medicine

## 2020-12-30 ENCOUNTER — Ambulatory Visit (INDEPENDENT_AMBULATORY_CARE_PROVIDER_SITE_OTHER): Payer: Medicare Other | Admitting: Family Medicine

## 2020-12-30 VITALS — BP 145/80 | HR 97 | Ht 72.0 in | Wt 201.0 lb

## 2020-12-30 DIAGNOSIS — E1169 Type 2 diabetes mellitus with other specified complication: Secondary | ICD-10-CM

## 2020-12-30 DIAGNOSIS — E785 Hyperlipidemia, unspecified: Secondary | ICD-10-CM

## 2020-12-30 DIAGNOSIS — E1159 Type 2 diabetes mellitus with other circulatory complications: Secondary | ICD-10-CM

## 2020-12-30 DIAGNOSIS — I152 Hypertension secondary to endocrine disorders: Secondary | ICD-10-CM

## 2020-12-30 LAB — BAYER DCA HB A1C WAIVED: HB A1C (BAYER DCA - WAIVED): 7.4 % — ABNORMAL HIGH (ref <7.0)

## 2020-12-30 NOTE — Progress Notes (Signed)
BP (!) 145/80   Pulse 97   Ht 6' (1.829 m)   Wt 201 lb (91.2 kg)   SpO2 95%   BMI 27.26 kg/m    Subjective:   Patient ID: Ray Carlson, male    DOB: 07/27/1946, 75 y.o.   MRN: [id number]  HPI: Ray Carlson is a 75 y.o. male presenting on 12/30/2020 for Medical Management of Chronic Issues, Diabetes, and Hypertension   HPI Diabetes Pt coming in for recheck on his diabetes. He states that everything is going well. He checks his sugars at home and states they typically run between 100-120. He reports no issues taking his medications and denies any side effects. He reports checking his feet frequently with no abnormalities and has been seeing an eye doctor. He states that his vision has been good and denies changes in urinary frequency, numbness or tingling in his hands or feet. Fine touch is intact in the lower extremities, heart sounds normal, lungs CTA bilat.  Hypertension Pt coming in today for a recheck on his HTN. BP today is 145/80. He checks his BP at home daily and states they have been running in the 130's consistently. He has no complaints otherwise and states he has been having no trouble with his medications. Hypertension Patient is currently on lisinopril and metoprolol and furosemide, and their blood pressure today is 145/80. Patient denies any lightheadedness or dizziness. Patient denies headaches, blurred vision, chest pains, shortness of breath, or weakness. Denies any side effects from medication and is content with current medication.   Relevant past medical, surgical, family and social history reviewed and updated as indicated. Interim medical history since our last visit reviewed. Allergies and medications reviewed and updated.  Review of Systems  Constitutional: Negative for chills, diaphoresis, fatigue, fever and unexpected weight change.  HENT: Negative.   Eyes: Negative for visual disturbance.  Respiratory: Negative for cough, chest tightness and shortness of  breath.   Cardiovascular: Negative for chest pain and leg swelling.  Gastrointestinal: Negative for constipation, diarrhea, nausea and vomiting.  Endocrine: Negative.  Negative for polydipsia and polyuria.  Genitourinary: Negative for decreased urine volume, difficulty urinating, dysuria, flank pain, frequency and urgency.  Musculoskeletal: Negative.   Skin: Negative.   Neurological: Negative for numbness.  Psychiatric/Behavioral: Negative.     Per HPI unless specifically indicated above   Allergies as of 12/30/2020      Reactions   Penicillins       Medication List       Accurate as of December 30, 2020  3:53 PM. If you have any questions, ask your nurse or doctor.        Accu-Chek Aviva Plus w/Device Kit 1 each by Does not apply route daily.   Breo Ellipta 100-25 MCG/INH Aepb Generic drug: fluticasone furoate-vilanterol Inhale 1 puff into the lungs daily.   furosemide 20 MG tablet Commonly known as: LASIX Take 1 tablet (20 mg total) by mouth daily.   glipiZIDE 5 MG tablet Commonly known as: GLUCOTROL Take 1 tablet (5 mg total) by mouth 2 (two) times daily.   glucose blood test strip Commonly known as: Accu-Chek Aviva Plus Test bid. E11.9   lisinopril 10 MG tablet Commonly known as: ZESTRIL Take 1 tablet (10 mg total) by mouth daily.   metFORMIN 500 MG tablet Commonly known as: GLUCOPHAGE Take 2 tablets (1,000 mg total) by mouth 2 (two) times daily.   metoprolol tartrate 50 MG tablet Commonly known as: LOPRESSOR Take 1 tablet (  50 mg total) by mouth 2 (two) times daily.   pravastatin 20 MG tablet Commonly known as: PRAVACHOL Take 1 tablet (20 mg total) by mouth daily.   sitaGLIPtin 100 MG tablet Commonly known as: Januvia Take 1 tablet (100 mg total) by mouth daily.   Spiriva HandiHaler 18 MCG inhalation capsule Generic drug: tiotropium Place 1 capsule (18 mcg total) into inhaler and inhale daily.   VITAMIN B 12 PO Take 1,000 mg by mouth daily.         Objective:   BP (!) 145/80   Pulse 97   Ht 6' (1.829 m)   Wt 201 lb (91.2 kg)   SpO2 95%   BMI 27.26 kg/m   Wt Readings from Last 3 Encounters:  12/30/20 201 lb (91.2 kg)  07/17/20 193 lb (87.5 kg)  04/10/20 188 lb (85.3 kg)    Physical Exam Constitutional:      Appearance: Normal appearance.  HENT:     Head: Normocephalic.     Right Ear: Tympanic membrane normal.     Left Ear: Tympanic membrane normal.  Eyes:     Conjunctiva/sclera: Conjunctivae normal.     Pupils: Pupils are equal, round, and reactive to light.  Cardiovascular:     Rate and Rhythm: Normal rate and regular rhythm.     Pulses: Normal pulses.     Heart sounds: Normal heart sounds.  Pulmonary:     Effort: Pulmonary effort is normal.     Breath sounds: Normal breath sounds.  Abdominal:     General: Abdomen is flat.     Palpations: Abdomen is soft.  Musculoskeletal:     Cervical back: Normal range of motion and neck supple.  Skin:    General: Skin is warm and dry.     Capillary Refill: Capillary refill takes less than 2 seconds.  Neurological:     General: No focal deficit present.     Mental Status: He is alert and oriented to person, place, and time.  Psychiatric:        Mood and Affect: Mood normal.        Behavior: Behavior normal.       Assessment & Plan:   Problem List Items Addressed This Visit      Cardiovascular and Mediastinum   Hypertension associated with diabetes ([place])   Relevant Orders   BMP8+EGFR (Completed)   CBC with Differential/Platelet (Completed)   Bayer DCA Hb A1c Waived (Completed)     Endocrine   Hyperlipidemia associated with type 2 diabetes mellitus ([place])   Relevant Orders   BMP8+EGFR (Completed)   CBC with Differential/Platelet (Completed)   Bayer DCA Hb A1c Waived (Completed)   Type 2 diabetes mellitus with other specified complication ([place]) - Primary   Relevant Orders   BMP8+EGFR (Completed)   CBC with Differential/Platelet (Completed)   Bayer DCA  Hb A1c Waived (Completed)       Follow up plan: Return in about 3 months (around 03/30/2021), or if symptoms worsen or fail to improve, for Diabetes and hypertension and cholesterol.  Pt doing well on current medications. HbA1c today is 7.4%. We will continue to monitor and reevaluate in 3 months to see if we need to consider further options.  Counseling provided for all of the vaccine components Orders Placed This Encounter  Procedures  . BMP8+EGFR  . CBC with Differential/Platelet  . Bayer [facility] Hb A1c Alfonse Alpers 12/30/2020  I was personally present for all components of the history,  physical exam and/or medical decision making.  I agree with the documentation performed by the PA student and agree with assessment and plan above.  PA student was [employer] Caryl Pina, MD [facility] 01/03/2021, 10:02 PM    Caryl Pina, MD [facility] 12/30/2020, 3:53 PM

## 2020-12-31 LAB — CBC WITH DIFFERENTIAL/PLATELET
Basophils Absolute: 0 10*3/uL (ref 0.0–0.2)
Basos: 1 %
EOS (ABSOLUTE): 0.2 10*3/uL (ref 0.0–0.4)
Eos: 3 %
Hematocrit: 39 % (ref 37.5–51.0)
Hemoglobin: 12.9 g/dL — ABNORMAL LOW (ref 13.0–17.7)
Immature Grans (Abs): 0 10*3/uL (ref 0.0–0.1)
Immature Granulocytes: 1 %
Lymphocytes Absolute: 1.3 10*3/uL (ref 0.7–3.1)
Lymphs: 21 %
MCH: 31.2 pg (ref 26.6–33.0)
MCHC: 33.1 g/dL (ref 31.5–35.7)
MCV: 94 fL (ref 79–97)
Monocytes Absolute: 0.8 10*3/uL (ref 0.1–0.9)
Monocytes: 12 %
Neutrophils Absolute: 3.9 10*3/uL (ref 1.4–7.0)
Neutrophils: 62 %
Platelets: 246 10*3/uL (ref 150–450)
RBC: 4.13 x10E6/uL — ABNORMAL LOW (ref 4.14–5.80)
RDW: 13 % (ref 11.6–15.4)
WBC: 6.2 10*3/uL (ref 3.4–10.8)

## 2020-12-31 LAB — BMP8+EGFR
BUN/Creatinine Ratio: 16 (ref 10–24)
BUN: 18 mg/dL (ref 8–27)
CO2: 24 mmol/L (ref 20–29)
Calcium: 9.4 mg/dL (ref 8.6–10.2)
Chloride: 92 mmol/L — ABNORMAL LOW (ref 96–106)
Creatinine, Ser: 1.12 mg/dL (ref 0.76–1.27)
GFR calc Af Amer: 74 mL/min/{1.73_m2} (ref >59)
GFR calc non Af Amer: 64 mL/min/{1.73_m2} (ref >59)
Glucose: 191 mg/dL — ABNORMAL HIGH (ref 65–99)
Potassium: 5 mmol/L (ref 3.5–5.2)
Sodium: 130 mmol/L — ABNORMAL LOW (ref 134–144)

## 2021-01-22 ENCOUNTER — Encounter: Payer: Self-pay | Admitting: *Deleted

## 2021-03-28 DIAGNOSIS — E119 Type 2 diabetes mellitus without complications: Secondary | ICD-10-CM | POA: Diagnosis not present

## 2021-03-28 DIAGNOSIS — H40033 Anatomical narrow angle, bilateral: Secondary | ICD-10-CM | POA: Diagnosis not present

## 2021-03-29 ENCOUNTER — Other Ambulatory Visit: Payer: Self-pay

## 2021-03-29 ENCOUNTER — Encounter: Payer: Self-pay | Admitting: Family Medicine

## 2021-03-29 ENCOUNTER — Ambulatory Visit (INDEPENDENT_AMBULATORY_CARE_PROVIDER_SITE_OTHER): Payer: Medicare Other | Admitting: Family Medicine

## 2021-03-29 VITALS — BP 144/75 | HR 73 | Ht 72.0 in | Wt 195.0 lb

## 2021-03-29 DIAGNOSIS — J439 Emphysema, unspecified: Secondary | ICD-10-CM | POA: Diagnosis not present

## 2021-03-29 DIAGNOSIS — E1159 Type 2 diabetes mellitus with other circulatory complications: Secondary | ICD-10-CM | POA: Diagnosis not present

## 2021-03-29 DIAGNOSIS — I152 Hypertension secondary to endocrine disorders: Secondary | ICD-10-CM

## 2021-03-29 DIAGNOSIS — E785 Hyperlipidemia, unspecified: Secondary | ICD-10-CM

## 2021-03-29 DIAGNOSIS — E1169 Type 2 diabetes mellitus with other specified complication: Secondary | ICD-10-CM | POA: Diagnosis not present

## 2021-03-29 LAB — BAYER DCA HB A1C WAIVED: HB A1C (BAYER DCA - WAIVED): 7.1 % — ABNORMAL HIGH (ref <7.0)

## 2021-03-29 MED ORDER — FUROSEMIDE 20 MG PO TABS: 20.0000 mg | ORAL_TABLET | Freq: Every day | ORAL | 3 refills | Status: DC

## 2021-03-29 MED ORDER — LISINOPRIL 10 MG PO TABS: 10.0000 mg | ORAL_TABLET | Freq: Every day | ORAL | 3 refills | Status: DC

## 2021-03-29 MED ORDER — ACCU-CHEK AVIVA PLUS VI STRP: ORAL_STRIP | 2 refills | Status: DC

## 2021-03-29 NOTE — Progress Notes (Signed)
BP (!) 144/75   Pulse 73   Ht 6' (1.829 m)   Wt 195 lb (88.5 kg)   BMI 26.45 kg/m    Subjective:   Patient ID: Ray Carlson, male    DOB: Jul 30, 1946, 75 y.o.   MRN: [id number]  HPI: Ray Carlson is a 75 y.o. male presenting on 03/29/2021 for Medical Management of Chronic Issues and Diabetes   HPI Type 2 diabetes mellitus Patient comes in today for recheck of his diabetes. Patient has been currently taking glipizide and metformin and Januvia. Patient is currently on an ACE inhibitor/ARB. Patient has not seen an ophthalmologist this year. Patient denies any issues with their feet. The symptom started onset as an adult hypertension and hyperlipidemia ARE RELATED TO DM   Hypertension Patient is currently on metoprolol and furosemide and lisinopril, and their blood pressure today is 144/75. Patient denies any lightheadedness or dizziness. Patient denies headaches, blurred vision, chest pains, shortness of breath, or weakness. Denies any side effects from medication and is content with current medication.   Hyperlipidemia Patient is coming in for recheck of his hyperlipidemia. The patient is currently taking pravastatin. They deny any issues with myalgias or history of liver damage from it. They deny any focal numbness or weakness or chest pain.   COPD Patient is coming in for COPD recheck today.  He is currently on Spiriva.  He has a mild chronic cough but denies any major coughing spells or wheezing spells.  He has 0nighttime symptoms per week and 0daytime symptoms per week currently.  He says he is getting a little bit of allergies but really not much.  Relevant past medical, surgical, family and social history reviewed and updated as indicated. Interim medical history since our last visit reviewed. Allergies and medications reviewed and updated.  Review of Systems  Constitutional: Negative for chills and fever.  Respiratory: Negative for shortness of breath and wheezing.    Cardiovascular: Negative for chest pain and leg swelling.  Musculoskeletal: Negative for back pain and gait problem.  Skin: Negative for rash.  Neurological: Negative for dizziness, weakness and light-headedness.  All other systems reviewed and are negative.   Per HPI unless specifically indicated above   Allergies as of 03/29/2021      Reactions   Penicillins       Medication List       Accurate as of March 29, 2021  4:04 PM. If you have any questions, ask your nurse or doctor.        Accu-Chek Aviva Plus w/Device Kit 1 each by Does not apply route daily.   Breo Ellipta 100-25 MCG/INH Aepb Generic drug: fluticasone furoate-vilanterol Inhale 1 puff into the lungs daily.   furosemide 20 MG tablet Commonly known as: LASIX Take 1 tablet (20 mg total) by mouth daily.   glipiZIDE 5 MG tablet Commonly known as: GLUCOTROL Take 1 tablet (5 mg total) by mouth 2 (two) times daily.   glucose blood test strip Commonly known as: Accu-Chek Aviva Plus Test bid. E11.9   lisinopril 10 MG tablet Commonly known as: ZESTRIL Take 1 tablet (10 mg total) by mouth daily.   metFORMIN 500 MG tablet Commonly known as: GLUCOPHAGE Take 2 tablets (1,000 mg total) by mouth 2 (two) times daily.   metoprolol tartrate 50 MG tablet Commonly known as: LOPRESSOR Take 1 tablet (50 mg total) by mouth 2 (two) times daily.   pravastatin 20 MG tablet Commonly known as: PRAVACHOL Take 1 tablet (20  mg total) by mouth daily.   sitaGLIPtin 100 MG tablet Commonly known as: Januvia Take 1 tablet (100 mg total) by mouth daily.   Spiriva HandiHaler 18 MCG inhalation capsule Generic drug: tiotropium Place 1 capsule (18 mcg total) into inhaler and inhale daily.   VITAMIN B 12 PO Take 1,000 mg by mouth daily.        Objective:   BP (!) 144/75   Pulse 73   Ht 6' (1.829 m)   Wt 195 lb (88.5 kg)   BMI 26.45 kg/m   Wt Readings from Last 3 Encounters:  03/29/21 195 lb (88.5 kg)  12/30/20 201  lb (91.2 kg)  07/17/20 193 lb (87.5 kg)    Physical Exam Vitals and nursing note reviewed.  Constitutional:      General: He is not in acute distress.    Appearance: He is well-developed. He is not diaphoretic.  Eyes:     General: No scleral icterus.    Conjunctiva/sclera: Conjunctivae normal.     Pupils: Pupils are equal, round, and reactive to light.  Neck:     Thyroid: No thyromegaly.  Cardiovascular:     Rate and Rhythm: Normal rate and regular rhythm.     Heart sounds: Normal heart sounds. No murmur heard.   Pulmonary:     Effort: Pulmonary effort is normal. No respiratory distress.     Breath sounds: Normal breath sounds. No wheezing.  Musculoskeletal:        General: Normal range of motion.     Cervical back: Neck supple.  Lymphadenopathy:     Cervical: No cervical adenopathy.  Skin:    General: Skin is warm and dry.     Findings: No rash.  Neurological:     Mental Status: He is alert and oriented to person, place, and time.     Coordination: Coordination normal.  Psychiatric:        Behavior: Behavior normal.       Assessment & Plan:   Problem List Items Addressed This Visit      Cardiovascular and Mediastinum   Hypertension associated with diabetes ([place])   Relevant Medications   furosemide (LASIX) 20 MG tablet   lisinopril (ZESTRIL) 10 MG tablet     Respiratory   COPD (chronic obstructive pulmonary disease) (HCC)     Endocrine   Hyperlipidemia associated with type 2 diabetes mellitus (HCC)   Relevant Medications   lisinopril (ZESTRIL) 10 MG tablet   Other Relevant Orders   Lipid panel   Type 2 diabetes mellitus with other specified complication (HCC) - Primary   Relevant Medications   glucose blood (ACCU-CHEK AVIVA PLUS) test strip   lisinopril (ZESTRIL) 10 MG tablet   Other Relevant Orders   Bayer DCA Hb A1c Waived      Continue current medication, no changes, will check blood work today.  Seems to be doing well. Follow up plan: Return in  about 3 months (around 06/28/2021), or if symptoms worsen or fail to improve, for Diabetes recheck.  Counseling provided for all of the vaccine components No orders of the defined types were placed in this encounter.   Caryl Pina, MD [facility] 03/29/2021, 4:04 PM

## 2021-03-30 LAB — LIPID PANEL
Chol/HDL Ratio: 5.4 ratio — ABNORMAL HIGH (ref 0.0–5.0)
Cholesterol, Total: 201 mg/dL — ABNORMAL HIGH (ref 100–199)
HDL: 37 mg/dL — ABNORMAL LOW (ref >39)
LDL Chol Calc (NIH): 135 mg/dL — ABNORMAL HIGH (ref 0–99)
Triglycerides: 162 mg/dL — ABNORMAL HIGH (ref 0–149)
VLDL Cholesterol Cal: 29 mg/dL (ref 5–40)

## 2021-03-31 ENCOUNTER — Ambulatory Visit: Payer: Medicare Other | Admitting: Family Medicine

## 2021-05-04 ENCOUNTER — Other Ambulatory Visit: Payer: Self-pay | Admitting: Family Medicine

## 2021-05-04 DIAGNOSIS — J439 Emphysema, unspecified: Secondary | ICD-10-CM

## 2021-06-30 ENCOUNTER — Other Ambulatory Visit: Payer: Self-pay

## 2021-06-30 ENCOUNTER — Encounter: Payer: Self-pay | Admitting: Family Medicine

## 2021-06-30 ENCOUNTER — Ambulatory Visit (INDEPENDENT_AMBULATORY_CARE_PROVIDER_SITE_OTHER): Payer: Medicare Other | Admitting: Family Medicine

## 2021-06-30 VITALS — BP 149/82 | HR 81 | Ht 72.0 in | Wt 190.0 lb

## 2021-06-30 DIAGNOSIS — Z23 Encounter for immunization: Secondary | ICD-10-CM | POA: Diagnosis not present

## 2021-06-30 DIAGNOSIS — E785 Hyperlipidemia, unspecified: Secondary | ICD-10-CM

## 2021-06-30 DIAGNOSIS — I152 Hypertension secondary to endocrine disorders: Secondary | ICD-10-CM | POA: Diagnosis not present

## 2021-06-30 DIAGNOSIS — E1169 Type 2 diabetes mellitus with other specified complication: Secondary | ICD-10-CM

## 2021-06-30 DIAGNOSIS — E1159 Type 2 diabetes mellitus with other circulatory complications: Secondary | ICD-10-CM | POA: Diagnosis not present

## 2021-06-30 LAB — BAYER DCA HB A1C WAIVED: HB A1C (BAYER DCA - WAIVED): 7.3 % — ABNORMAL HIGH (ref <7.0)

## 2021-06-30 NOTE — Progress Notes (Signed)
BP (!) 149/82   Pulse 81   Ht 6' (1.829 m)   Wt 190 lb (86.2 kg)   SpO2 95%   BMI 25.77 kg/m    Subjective:   Patient ID: Ray Carlson, male    DOB: 05/01/1946, 75 y.o.   MRN: [id number]  HPI: Ray Carlson is a 75 y.o. male presenting on 06/30/2021 for Medical Management of Chronic Issues and Diabetes   HPI Type 2 diabetes mellitus Patient comes in today for recheck of his diabetes. Patient has been currently taking metformin and Januvia and glipizide. Patient is currently on an ACE inhibitor/ARB. Patient has not seen an ophthalmologist this year. Patient denies any issues with their feet. The symptom started onset as an adult hypertension and hyperlipidemia ARE RELATED TO DM   Hypertension Patient is currently on lisinopril and metoprolol and furosemide, and their blood pressure today is 149/82. Patient denies any lightheadedness or dizziness. Patient denies headaches, blurred vision, chest pains, shortness of breath, or weakness. Denies any side effects from medication and is content with current medication.   Hyperlipidemia Patient is coming in for recheck of his hyperlipidemia. The patient is currently taking pravastatin. They deny any issues with myalgias or history of liver damage from it. They deny any focal numbness or weakness or chest pain.   Relevant past medical, surgical, family and social history reviewed and updated as indicated. Interim medical history since our last visit reviewed. Allergies and medications reviewed and updated.  Review of Systems  Constitutional:  Negative for chills and fever.  Respiratory:  Negative for shortness of breath and wheezing.   Cardiovascular:  Negative for chest pain and leg swelling.  Musculoskeletal:  Negative for back pain and gait problem.  Skin:  Negative for rash.  Neurological:  Negative for dizziness and light-headedness.  All other systems reviewed and are negative.  Per HPI unless specifically indicated  above   Allergies as of 06/30/2021       Reactions   Penicillins         Medication List        Accurate as of June 30, 2021  4:12 PM. If you have any questions, ask your nurse or doctor.          Accu-Chek Aviva Plus test strip Generic drug: glucose blood Test bid. E11.9   Accu-Chek Aviva Plus w/Device Kit 1 each by Does not apply route daily.   Breo Ellipta 100-25 MCG/INH Aepb Generic drug: fluticasone furoate-vilanterol INHALE 1 PUFF INTO THE LUNGS ONCE DAILY.   furosemide 20 MG tablet Commonly known as: LASIX Take 1 tablet (20 mg total) by mouth daily.   glipiZIDE 5 MG tablet Commonly known as: GLUCOTROL Take 1 tablet (5 mg total) by mouth 2 (two) times daily.   lisinopril 10 MG tablet Commonly known as: ZESTRIL Take 1 tablet (10 mg total) by mouth daily.   metFORMIN 500 MG tablet Commonly known as: GLUCOPHAGE Take 2 tablets (1,000 mg total) by mouth 2 (two) times daily.   metoprolol tartrate 50 MG tablet Commonly known as: LOPRESSOR Take 1 tablet (50 mg total) by mouth 2 (two) times daily.   pravastatin 20 MG tablet Commonly known as: PRAVACHOL Take 1 tablet (20 mg total) by mouth daily.   sitaGLIPtin 100 MG tablet Commonly known as: Januvia Take 1 tablet (100 mg total) by mouth daily.   Spiriva HandiHaler 18 MCG inhalation capsule Generic drug: tiotropium Place 1 capsule (18 mcg total) into inhaler and inhale daily.  VITAMIN B 12 PO Take 1,000 mg by mouth daily.         Objective:   BP (!) 149/82   Pulse 81   Ht 6' (1.829 m)   Wt 190 lb (86.2 kg)   SpO2 95%   BMI 25.77 kg/m   Wt Readings from Last 3 Encounters:  06/30/21 190 lb (86.2 kg)  03/29/21 195 lb (88.5 kg)  12/30/20 201 lb (91.2 kg)    Physical Exam Vitals and nursing note reviewed.  Constitutional:      General: He is not in acute distress.    Appearance: He is well-developed. He is not diaphoretic.  Eyes:     General: No scleral icterus.     Conjunctiva/sclera: Conjunctivae normal.  Neck:     Thyroid: No thyromegaly.  Cardiovascular:     Rate and Rhythm: Normal rate and regular rhythm.     Heart sounds: Normal heart sounds. No murmur heard. Pulmonary:     Effort: Pulmonary effort is normal. No respiratory distress.     Breath sounds: Normal breath sounds. No wheezing.  Musculoskeletal:        General: Normal range of motion.     Cervical back: Neck supple.  Lymphadenopathy:     Cervical: No cervical adenopathy.  Skin:    General: Skin is warm and dry.     Findings: No rash.  Neurological:     Mental Status: He is alert and oriented to person, place, and time.     Coordination: Coordination normal.  Psychiatric:        Behavior: Behavior normal.      Assessment & Plan:   Problem List Items Addressed This Visit       Cardiovascular and Mediastinum   Hypertension associated with diabetes ([place])   Relevant Orders   CBC with Differential/Platelet   CMP14+EGFR   Lipid panel   Bayer DCA Hb A1c Waived     Endocrine   Hyperlipidemia associated with type 2 diabetes mellitus ([place])   Relevant Orders   CBC with Differential/Platelet   CMP14+EGFR   Lipid panel   Bayer DCA Hb A1c Waived   Type 2 diabetes mellitus with other specified complication ([place]) - Primary   Relevant Orders   CBC with Differential/Platelet   CMP14+EGFR   Lipid panel   Bayer DCA Hb A1c Waived   Other Visit Diagnoses     Need for shingles vaccine       Relevant Orders   Varicella-zoster vaccine IM (Shingrix) (Completed)       Continue current medication, no changes. A1c up slightly at 7.3.  Mainly focus on diet unless increases more.     Follow up plan: Return in about 3 months (around 09/30/2021), or if symptoms worsen or fail to improve, for Diabetes recheck.  Counseling provided for all of the vaccine components Orders Placed This Encounter  Procedures   Varicella-zoster vaccine IM (Shingrix)   CBC with Differential/Platelet    CMP14+EGFR   Lipid panel   Bayer DCA Hb A1c Waived    Caryl Pina, MD [facility] 06/30/2021, 4:12 PM

## 2021-07-01 LAB — CMP14+EGFR
ALT: 24 IU/L (ref 0–44)
AST: 16 IU/L (ref 0–40)
Albumin/Globulin Ratio: 1.7 (ref 1.2–2.2)
Albumin: 4.5 g/dL (ref 3.7–4.7)
Alkaline Phosphatase: 57 IU/L (ref 44–121)
BUN/Creatinine Ratio: 14 (ref 10–24)
BUN: 14 mg/dL (ref 8–27)
Bilirubin Total: 0.3 mg/dL (ref 0.0–1.2)
CO2: 24 mmol/L (ref 20–29)
Calcium: 9.5 mg/dL (ref 8.6–10.2)
Chloride: 96 mmol/L (ref 96–106)
Creatinine, Ser: 1.03 mg/dL (ref 0.76–1.27)
Globulin, Total: 2.6 g/dL (ref 1.5–4.5)
Glucose: 87 mg/dL (ref 65–99)
Potassium: 5.1 mmol/L (ref 3.5–5.2)
Sodium: 135 mmol/L (ref 134–144)
Total Protein: 7.1 g/dL (ref 6.0–8.5)
eGFR: 75 mL/min/{1.73_m2} (ref >59)

## 2021-07-01 LAB — CBC WITH DIFFERENTIAL/PLATELET
Basophils Absolute: 0.1 10*3/uL (ref 0.0–0.2)
Basos: 1 %
EOS (ABSOLUTE): 0.4 10*3/uL (ref 0.0–0.4)
Eos: 5 %
Hematocrit: 37.7 % (ref 37.5–51.0)
Hemoglobin: 12.8 g/dL — ABNORMAL LOW (ref 13.0–17.7)
Immature Grans (Abs): 0.1 10*3/uL (ref 0.0–0.1)
Immature Granulocytes: 1 %
Lymphocytes Absolute: 2 10*3/uL (ref 0.7–3.1)
Lymphs: 26 %
MCH: 31.5 pg (ref 26.6–33.0)
MCHC: 34 g/dL (ref 31.5–35.7)
MCV: 93 fL (ref 79–97)
Monocytes Absolute: 1.1 10*3/uL — ABNORMAL HIGH (ref 0.1–0.9)
Monocytes: 14 %
Neutrophils Absolute: 4.1 10*3/uL (ref 1.4–7.0)
Neutrophils: 53 %
Platelets: 256 10*3/uL (ref 150–450)
RBC: 4.06 x10E6/uL — ABNORMAL LOW (ref 4.14–5.80)
RDW: 14.7 % (ref 11.6–15.4)
WBC: 7.6 10*3/uL (ref 3.4–10.8)

## 2021-07-01 LAB — LIPID PANEL
Chol/HDL Ratio: 3.3 ratio (ref 0.0–5.0)
Cholesterol, Total: 126 mg/dL (ref 100–199)
HDL: 38 mg/dL — ABNORMAL LOW (ref >39)
LDL Chol Calc (NIH): 64 mg/dL (ref 0–99)
Triglycerides: 134 mg/dL (ref 0–149)
VLDL Cholesterol Cal: 24 mg/dL (ref 5–40)

## 2021-08-12 ENCOUNTER — Ambulatory Visit: Payer: Medicare Other | Admitting: Family Medicine

## 2021-08-19 ENCOUNTER — Encounter: Payer: Self-pay | Admitting: Family Medicine

## 2021-09-08 ENCOUNTER — Encounter: Payer: Self-pay | Admitting: Family Medicine

## 2021-09-08 ENCOUNTER — Other Ambulatory Visit: Payer: Self-pay

## 2021-09-08 ENCOUNTER — Ambulatory Visit (INDEPENDENT_AMBULATORY_CARE_PROVIDER_SITE_OTHER): Payer: Medicare Other | Admitting: Family Medicine

## 2021-09-08 VITALS — BP 153/83 | HR 93 | Ht 72.0 in | Wt 184.0 lb

## 2021-09-08 DIAGNOSIS — M19041 Primary osteoarthritis, right hand: Secondary | ICD-10-CM

## 2021-09-08 DIAGNOSIS — M19042 Primary osteoarthritis, left hand: Secondary | ICD-10-CM | POA: Diagnosis not present

## 2021-09-08 MED ORDER — DICLOFENAC SODIUM 50 MG PO TBEC: 50.0000 mg | DELAYED_RELEASE_TABLET | Freq: Two times a day (BID) | ORAL | 2 refills | Status: DC

## 2021-09-08 MED ORDER — DICLOFENAC SODIUM 1 % EX GEL: 2.0000 g | Freq: Four times a day (QID) | CUTANEOUS | 3 refills | Status: DC

## 2021-09-08 NOTE — Progress Notes (Signed)
BP (!) 153/83   Pulse 93   Ht 6' (1.829 m)   Wt 184 lb (83.5 kg)   SpO2 92%   BMI 24.95 kg/m    Subjective:   Patient ID: Ray Carlson, male    DOB: 08-14-1946, 75 y.o.   MRN: [id number]  HPI: Ray Carlson is a 75 y.o. male presenting on 09/08/2021 for Arthritis (Bilateral hands. Causing pain.)   HPI Bilateral hand osteoarthritis. Patient is having swelling and inflammation in the joints of both of his hands.  He says has had this multiple times before and has had injections that have helped but then it comes back.  This time it started coming back about a week ago and has been bothering him.  He denies any numbness or weakness but just has the swelling and pain in the joints of his hands.  Relevant past medical, surgical, family and social history reviewed and updated as indicated. Interim medical history since our last visit reviewed. Allergies and medications reviewed and updated.  Review of Systems  Constitutional:  Negative for activity change, appetite change, chills and fever.  Musculoskeletal:  Positive for arthralgias and joint swelling.  Skin:  Negative for color change and rash.   Per HPI unless specifically indicated above   Allergies as of 09/08/2021       Reactions   Penicillins         Medication List        Accurate as of September 08, 2021  9:54 AM. If you have any questions, ask your nurse or doctor.          Accu-Chek Aviva Plus test strip Generic drug: glucose blood Test bid. E11.9   Accu-Chek Aviva Plus w/Device Kit 1 each by Does not apply route daily.   Breo Ellipta 100-25 MCG/INH Aepb Generic drug: fluticasone furoate-vilanterol INHALE 1 PUFF INTO THE LUNGS ONCE DAILY.   diclofenac 50 MG EC tablet Commonly known as: VOLTAREN Take 1 tablet (50 mg total) by mouth 2 (two) times daily. Started by: Fransisca Kaufmann Itzel Lowrimore, MD   diclofenac Sodium 1 % Gel Commonly known as: Voltaren Apply 2 g topically 4 (four) times daily. Started by:  Worthy Rancher, MD   furosemide 20 MG tablet Commonly known as: LASIX Take 1 tablet (20 mg total) by mouth daily.   glipiZIDE 5 MG tablet Commonly known as: GLUCOTROL Take 1 tablet (5 mg total) by mouth 2 (two) times daily.   lisinopril 10 MG tablet Commonly known as: ZESTRIL Take 1 tablet (10 mg total) by mouth daily.   metFORMIN 500 MG tablet Commonly known as: GLUCOPHAGE Take 2 tablets (1,000 mg total) by mouth 2 (two) times daily.   metoprolol tartrate 50 MG tablet Commonly known as: LOPRESSOR Take 1 tablet (50 mg total) by mouth 2 (two) times daily.   pravastatin 20 MG tablet Commonly known as: PRAVACHOL Take 1 tablet (20 mg total) by mouth daily.   sitaGLIPtin 100 MG tablet Commonly known as: Januvia Take 1 tablet (100 mg total) by mouth daily.   Spiriva HandiHaler 18 MCG inhalation capsule Generic drug: tiotropium Place 1 capsule (18 mcg total) into inhaler and inhale daily.   VITAMIN B 12 PO Take 1,000 mg by mouth daily.         Objective:   BP (!) 153/83   Pulse 93   Ht 6' (1.829 m)   Wt 184 lb (83.5 kg)   SpO2 92%   BMI 24.95 kg/m  Wt Readings from Last 3 Encounters:  09/08/21 184 lb (83.5 kg)  06/30/21 190 lb (86.2 kg)  03/29/21 195 lb (88.5 kg)    Physical Exam Vitals and nursing note reviewed.  Constitutional:      Appearance: Normal appearance. He is normal weight. He is not ill-appearing or diaphoretic.  Musculoskeletal:     Right hand: Swelling present. No deformity or bony tenderness. Normal range of motion. Normal strength. Normal sensation.     Left hand: Swelling present. No deformity or bony tenderness. Normal range of motion. Normal strength. Normal sensation.  Neurological:     Mental Status: He is alert.      Assessment & Plan:   Problem List Items Addressed This Visit   None Visit Diagnoses     Primary osteoarthritis of both hands    -  Primary   Relevant Medications   diclofenac (VOLTAREN) 50 MG EC tablet    diclofenac Sodium (VOLTAREN) 1 % GEL       Will give patient diclofenac gel and diclofenac tablets to help with his arthritis.  Follow-up as needed Follow up plan: Return if symptoms worsen or fail to improve.  Counseling provided for all of the vaccine components No orders of the defined types were placed in this encounter.   Caryl Pina, MD [facility] 09/08/2021, 9:54 AM

## 2021-09-30 ENCOUNTER — Ambulatory Visit (INDEPENDENT_AMBULATORY_CARE_PROVIDER_SITE_OTHER): Payer: Medicare Other | Admitting: Family Medicine

## 2021-09-30 ENCOUNTER — Encounter: Payer: Self-pay | Admitting: Family Medicine

## 2021-09-30 ENCOUNTER — Other Ambulatory Visit: Payer: Self-pay

## 2021-09-30 VITALS — BP 162/69 | HR 77 | Ht 72.0 in | Wt 183.0 lb

## 2021-09-30 DIAGNOSIS — E1159 Type 2 diabetes mellitus with other circulatory complications: Secondary | ICD-10-CM

## 2021-09-30 DIAGNOSIS — J439 Emphysema, unspecified: Secondary | ICD-10-CM | POA: Diagnosis not present

## 2021-09-30 DIAGNOSIS — E785 Hyperlipidemia, unspecified: Secondary | ICD-10-CM

## 2021-09-30 DIAGNOSIS — Z23 Encounter for immunization: Secondary | ICD-10-CM

## 2021-09-30 DIAGNOSIS — I152 Hypertension secondary to endocrine disorders: Secondary | ICD-10-CM

## 2021-09-30 DIAGNOSIS — E1169 Type 2 diabetes mellitus with other specified complication: Secondary | ICD-10-CM | POA: Diagnosis not present

## 2021-09-30 LAB — BAYER DCA HB A1C WAIVED: HB A1C (BAYER DCA - WAIVED): 6.1 % — ABNORMAL HIGH (ref 4.8–5.6)

## 2021-09-30 MED ORDER — METFORMIN HCL 500 MG PO TABS: 1000.0000 mg | ORAL_TABLET | Freq: Two times a day (BID) | ORAL | 3 refills | Status: DC

## 2021-09-30 MED ORDER — SPIRIVA HANDIHALER 18 MCG IN CAPS: 18.0000 ug | ORAL_CAPSULE | Freq: Every day | RESPIRATORY_TRACT | 3 refills | Status: DC

## 2021-09-30 MED ORDER — PRAVASTATIN SODIUM 20 MG PO TABS: 20.0000 mg | ORAL_TABLET | Freq: Every day | ORAL | 3 refills | Status: DC

## 2021-09-30 MED ORDER — SITAGLIPTIN PHOSPHATE 100 MG PO TABS: 100.0000 mg | ORAL_TABLET | Freq: Every day | ORAL | 3 refills | Status: DC

## 2021-09-30 MED ORDER — METOPROLOL TARTRATE 50 MG PO TABS: 50.0000 mg | ORAL_TABLET | Freq: Two times a day (BID) | ORAL | 3 refills | Status: DC

## 2021-09-30 MED ORDER — GLIPIZIDE 5 MG PO TABS: 5.0000 mg | ORAL_TABLET | Freq: Two times a day (BID) | ORAL | 3 refills | Status: DC

## 2021-09-30 NOTE — Progress Notes (Signed)
BP (!) 162/69   Pulse 77   Ht 6' (1.829 m)   Wt 183 lb (83 kg)   SpO2 97%   BMI 24.82 kg/m    Subjective:   Patient ID: Ray Carlson, male    DOB: 08-24-1946, 75 y.o.   MRN: [id number]  HPI: Ray Carlson is a 75 y.o. male presenting on 09/30/2021 for Medical Management of Chronic Issues and Diabetes   HPI Type 2 diabetes mellitus Patient comes in today for recheck of his diabetes. Patient has been currently taking glipizide and metformin and Januvia. Patient is currently on an ACE inhibitor/ARB. Patient has not seen an ophthalmologist this year. Patient denies any issues with their feet. The symptom started onset as an adult hypertension hyperlipidemia ARE RELATED TO DM   Hypertension Patient is currently on furosemide and lisinopril and metoprolol, and their blood pressure today is 162/69. Patient denies any lightheadedness or dizziness. Patient denies headaches, blurred vision, chest pains, shortness of breath, or weakness. Denies any side effects from medication and is content with current medication.   Hyperlipidemia Patient is coming in for recheck of his hyperlipidemia. The patient is currently taking pravastatin. They deny any issues with myalgias or history of liver damage from it. They deny any focal numbness or weakness or chest pain.   COPD Patient is coming in for COPD recheck today.  He is currently on Breo and Spiriva.  He has a mild chronic cough but denies any major coughing spells or wheezing spells.  He has 0nighttime symptoms per week and 0daytime symptoms per week currently.  He says he uses multiple inhalers and denies any major issues.  Relevant past medical, surgical, family and social history reviewed and updated as indicated. Interim medical history since our last visit reviewed. Allergies and medications reviewed and updated.  Review of Systems  Constitutional:  Negative for chills and fever.  Eyes:  Negative for visual disturbance.  Respiratory:  Negative  for shortness of breath and wheezing.   Cardiovascular:  Negative for chest pain and leg swelling.  Musculoskeletal:  Negative for back pain and gait problem.  Skin:  Negative for rash.  Neurological:  Negative for dizziness, weakness and light-headedness.  All other systems reviewed and are negative.  Per HPI unless specifically indicated above   Allergies as of 09/30/2021       Reactions   Penicillins         Medication List        Accurate as of September 30, 2021  4:10 PM. If you have any questions, ask your nurse or doctor.          Accu-Chek Aviva Plus test strip Generic drug: glucose blood Test bid. E11.9   Accu-Chek Aviva Plus w/Device Kit 1 each by Does not apply route daily.   Breo Ellipta 100-25 MCG/ACT Aepb Generic drug: fluticasone furoate-vilanterol INHALE 1 PUFF INTO THE LUNGS ONCE DAILY.   diclofenac 50 MG EC tablet Commonly known as: VOLTAREN Take 1 tablet (50 mg total) by mouth 2 (two) times daily.   diclofenac Sodium 1 % Gel Commonly known as: Voltaren Apply 2 g topically 4 (four) times daily.   furosemide 20 MG tablet Commonly known as: LASIX Take 1 tablet (20 mg total) by mouth daily.   glipiZIDE 5 MG tablet Commonly known as: GLUCOTROL Take 1 tablet (5 mg total) by mouth 2 (two) times daily.   lisinopril 10 MG tablet Commonly known as: ZESTRIL Take 1 tablet (10 mg total)  by mouth daily.   metFORMIN 500 MG tablet Commonly known as: GLUCOPHAGE Take 2 tablets (1,000 mg total) by mouth 2 (two) times daily.   metoprolol tartrate 50 MG tablet Commonly known as: LOPRESSOR Take 1 tablet (50 mg total) by mouth 2 (two) times daily.   pravastatin 20 MG tablet Commonly known as: PRAVACHOL Take 1 tablet (20 mg total) by mouth daily.   sitaGLIPtin 100 MG tablet Commonly known as: Januvia Take 1 tablet (100 mg total) by mouth daily.   Spiriva HandiHaler 18 MCG inhalation capsule Generic drug: tiotropium Place 1 capsule (18 mcg total)  into inhaler and inhale daily.   VITAMIN B 12 PO Take 1,000 mg by mouth daily.         Objective:   BP (!) 162/69   Pulse 77   Ht 6' (1.829 m)   Wt 183 lb (83 kg)   SpO2 97%   BMI 24.82 kg/m   Wt Readings from Last 3 Encounters:  09/30/21 183 lb (83 kg)  09/08/21 184 lb (83.5 kg)  06/30/21 190 lb (86.2 kg)    Physical Exam Vitals and nursing note reviewed.  Constitutional:      General: He is not in acute distress.    Appearance: He is well-developed. He is not diaphoretic.  Eyes:     General: No scleral icterus.    Conjunctiva/sclera: Conjunctivae normal.  Neck:     Thyroid: No thyromegaly.  Cardiovascular:     Rate and Rhythm: Normal rate and regular rhythm.     Heart sounds: Normal heart sounds. No murmur heard. Pulmonary:     Effort: Pulmonary effort is normal. No respiratory distress.     Breath sounds: Normal breath sounds. No wheezing.  Musculoskeletal:        General: No swelling. Normal range of motion.     Cervical back: Neck supple.  Lymphadenopathy:     Cervical: No cervical adenopathy.  Skin:    General: Skin is warm and dry.     Findings: No rash.  Neurological:     Mental Status: He is alert and oriented to person, place, and time.     Coordination: Coordination normal.  Psychiatric:        Behavior: Behavior normal.    Diabetic Foot Exam - Simple   Simple Foot Form Diabetic Foot exam was performed with the following findings: Yes 09/30/2021  4:09 PM  Visual Inspection No deformities, no ulcerations, no other skin breakdown bilaterally: Yes Sensation Testing Intact to touch and monofilament testing bilaterally: Yes Pulse Check Posterior Tibialis and Dorsalis pulse intact bilaterally: Yes Comments Patient does have hammertoes on his second through fifth toes on both feet      Assessment & Plan:   Problem List Items Addressed This Visit       Cardiovascular and Mediastinum   Hypertension associated with diabetes ([place])   Relevant  Medications   sitaGLIPtin (JANUVIA) 100 MG tablet   pravastatin (PRAVACHOL) 20 MG tablet   metoprolol tartrate (LOPRESSOR) 50 MG tablet   metFORMIN (GLUCOPHAGE) 500 MG tablet   glipiZIDE (GLUCOTROL) 5 MG tablet     Respiratory   COPD (chronic obstructive pulmonary disease) (HCC)   Relevant Medications   tiotropium (SPIRIVA HANDIHALER) 18 MCG inhalation capsule     Endocrine   Hyperlipidemia associated with type 2 diabetes mellitus (HCC)   Relevant Medications   sitaGLIPtin (JANUVIA) 100 MG tablet   pravastatin (PRAVACHOL) 20 MG tablet   metoprolol tartrate (LOPRESSOR) 50 MG tablet  metFORMIN (GLUCOPHAGE) 500 MG tablet   glipiZIDE (GLUCOTROL) 5 MG tablet   Type 2 diabetes mellitus with other specified complication (HCC) - Primary   Relevant Medications   sitaGLIPtin (JANUVIA) 100 MG tablet   pravastatin (PRAVACHOL) 20 MG tablet   metFORMIN (GLUCOPHAGE) 500 MG tablet   glipiZIDE (GLUCOTROL) 5 MG tablet   Other Relevant Orders   Bayer DCA Hb A1c Waived    A1c is 6.1 and looks good, no changes. Blood pressure is elevated today but he runs higher and we have allowed permissive hypertension because of his unsteadiness and dizziness.  We will just monitor closely.  Not going to make any changes today for his blood pressure. Follow up plan: Return in about 3 months (around 12/31/2021), or if symptoms worsen or fail to improve, for Diabetes and hypertension and cholesterol and COPD.  Counseling provided for all of the vaccine components Orders Placed This Encounter  Procedures   Bayer [place] Hb A1c [place] Johnie Makki, MD [facility] 09/30/2021, 4:10 PM

## 2021-11-10 ENCOUNTER — Other Ambulatory Visit: Payer: Self-pay | Admitting: Family Medicine

## 2021-11-10 DIAGNOSIS — J439 Emphysema, unspecified: Secondary | ICD-10-CM

## 2021-12-07 ENCOUNTER — Other Ambulatory Visit: Payer: Self-pay | Admitting: Family Medicine

## 2021-12-07 DIAGNOSIS — M19041 Primary osteoarthritis, right hand: Secondary | ICD-10-CM

## 2021-12-31 ENCOUNTER — Ambulatory Visit (INDEPENDENT_AMBULATORY_CARE_PROVIDER_SITE_OTHER): Payer: Medicare Other | Admitting: Family Medicine

## 2021-12-31 ENCOUNTER — Encounter: Payer: Self-pay | Admitting: Family Medicine

## 2021-12-31 VITALS — BP 151/91 | HR 78 | Ht 72.0 in | Wt 183.0 lb

## 2021-12-31 DIAGNOSIS — J439 Emphysema, unspecified: Secondary | ICD-10-CM

## 2021-12-31 DIAGNOSIS — M19041 Primary osteoarthritis, right hand: Secondary | ICD-10-CM | POA: Diagnosis not present

## 2021-12-31 DIAGNOSIS — E1169 Type 2 diabetes mellitus with other specified complication: Secondary | ICD-10-CM | POA: Diagnosis not present

## 2021-12-31 DIAGNOSIS — E1159 Type 2 diabetes mellitus with other circulatory complications: Secondary | ICD-10-CM

## 2021-12-31 DIAGNOSIS — M19042 Primary osteoarthritis, left hand: Secondary | ICD-10-CM

## 2021-12-31 DIAGNOSIS — E785 Hyperlipidemia, unspecified: Secondary | ICD-10-CM

## 2021-12-31 DIAGNOSIS — E11621 Type 2 diabetes mellitus with foot ulcer: Secondary | ICD-10-CM | POA: Diagnosis not present

## 2021-12-31 DIAGNOSIS — L97512 Non-pressure chronic ulcer of other part of right foot with fat layer exposed: Secondary | ICD-10-CM | POA: Diagnosis not present

## 2021-12-31 DIAGNOSIS — I152 Hypertension secondary to endocrine disorders: Secondary | ICD-10-CM

## 2021-12-31 LAB — BAYER DCA HB A1C WAIVED: HB A1C (BAYER DCA - WAIVED): 6.2 % — ABNORMAL HIGH (ref 4.8–5.6)

## 2021-12-31 MED ORDER — DICLOFENAC SODIUM 50 MG PO TBEC: 50.0000 mg | DELAYED_RELEASE_TABLET | Freq: Two times a day (BID) | ORAL | 5 refills | Status: DC

## 2021-12-31 MED ORDER — METHYLPREDNISOLONE ACETATE 80 MG/ML IJ SUSP
80.0000 mg | Freq: Once | INTRAMUSCULAR | Status: AC
  Administered 2021-12-31: 80 mg via INTRAMUSCULAR

## 2021-12-31 MED ORDER — AMOXICILLIN-POT CLAVULANATE 875-125 MG PO TABS: 1.0000 | ORAL_TABLET | Freq: Two times a day (BID) | ORAL | 0 refills | Status: DC

## 2021-12-31 NOTE — Progress Notes (Signed)
° °BP (!) 151/91    Pulse 78    Ht 6' (1.829 m)    Wt 183 lb (83 kg)    SpO2 96%    BMI 24.82 kg/m²   ° °Subjective:  ° °Patient ID: Ray Carlson, male    DOB: 02/09/1946, 75 y.o.   MRN: [id number] ° °HPI: °Ray Carlson is a 75 y.o. male presenting on 12/31/2021 for Medical Management of Chronic Issues, Diabetes, and Hypertension ° ° °HPI °Type 2 diabetes mellitus °Patient comes in today for recheck of his diabetes. Patient has been currently taking glipizide and metformin and Januvia. Patient is currently on an ACE inhibitor/ARB. Patient has not seen an ophthalmologist this year. Patient has a right toe ulcer on the lateral aspect of his right toe and he does not recall when its been there or how long its been there, it does go through the superficial skin layers down to the hypodermis.  Concern for possible. The symptom started onset as an adult hypertension and hyperlipidemia ARE RELATED TO DM  ° °Hypertension °Patient is currently on furosemide and lisinopril and metoprolol, and their blood pressure today is 151/91. Patient denies any lightheadedness or dizziness. Patient denies headaches, blurred vision, chest pains, shortness of breath, or weakness. Denies any side effects from medication and is content with current medication.  ° °Hyperlipidemia °Patient is coming in for recheck of his hyperlipidemia. The patient is currently taking pravastatin. They deny any issues with myalgias or history of liver damage from it. They deny any focal numbness or weakness or chest pain.  ° °COPD °Patient is coming in for COPD recheck today.  Patient is having wheezing and congestion.  We are going to give him Depo-Medrol 80, it is improving from the illness that he had but he still has the wheezing that is not completely over it. ° °Patient has noticed some sore just in the past week on his foot but has not done anything about it yet. ° °Relevant past medical, surgical, family and social history reviewed and updated as indicated.  Interim medical history since our last visit reviewed. °Allergies and medications reviewed and updated. ° °Review of Systems  °Constitutional:  Negative for chills and fever.  °Eyes:  Negative for visual disturbance.  °Respiratory:  Negative for shortness of breath and wheezing.   °Cardiovascular:  Negative for chest pain and leg swelling.  °Musculoskeletal:  Negative for back pain and gait problem.  °Skin:  Negative for rash.  °All other systems reviewed and are negative. ° °Per HPI unless specifically indicated above ° ° °Allergies as of 12/31/2021   ° °   Reactions  ° Penicillins   ° °  ° °  °Medication List  °  ° °  ° Accurate as of December 31, 2021  3:47 PM. If you have any questions, ask your nurse or doctor.  °  °  ° °  ° °Accu-Chek Aviva Plus test strip °Generic drug: glucose blood °Test bid. E11.9 °  °Accu-Chek Aviva Plus w/Device Kit °1 each by Does not apply route daily. °  °amoxicillin-clavulanate 875-125 MG tablet °Commonly known as: AUGMENTIN °Take 1 tablet by mouth 2 (two) times daily. °Started by: Joshua A Dettinger, MD °  °Breo Ellipta 100-25 MCG/ACT Aepb °Generic drug: fluticasone furoate-vilanterol °INHALE 1 PUFF INTO THE LUNGS ONCE DAILY. °  °diclofenac 50 MG EC tablet °Commonly known as: VOLTAREN °Take 1 tablet (50 mg total) by mouth 2 (two) times daily. °  °diclofenac Sodium 1 %   Gel °Commonly known as: Voltaren °Apply 2 g topically 4 (four) times daily. °  °furosemide 20 MG tablet °Commonly known as: LASIX °Take 1 tablet (20 mg total) by mouth daily. °  °glipiZIDE 5 MG tablet °Commonly known as: GLUCOTROL °Take 1 tablet (5 mg total) by mouth 2 (two) times daily. °  °lisinopril 10 MG tablet °Commonly known as: ZESTRIL °Take 1 tablet (10 mg total) by mouth daily. °  °metFORMIN 500 MG tablet °Commonly known as: GLUCOPHAGE °Take 2 tablets (1,000 mg total) by mouth 2 (two) times daily. °  °metoprolol tartrate 50 MG tablet °Commonly known as: LOPRESSOR °Take 1 tablet (50 mg total) by mouth 2 (two)  times daily. °  °pravastatin 20 MG tablet °Commonly known as: PRAVACHOL °Take 1 tablet (20 mg total) by mouth daily. °  °sitaGLIPtin 100 MG tablet °Commonly known as: Januvia °Take 1 tablet (100 mg total) by mouth daily. °  °Spiriva HandiHaler 18 MCG inhalation capsule °Generic drug: tiotropium °Place 1 capsule (18 mcg total) into inhaler and inhale daily. °  °VITAMIN B 12 PO °Take 1,000 mg by mouth daily. °  ° °  ° ° ° °Objective:  ° °BP (!) 151/91    Pulse 78    Ht 6' (1.829 m)    Wt 183 lb (83 kg)    SpO2 96%    BMI 24.82 kg/m²   °Wt Readings from Last 3 Encounters:  °12/31/21 183 lb (83 kg)  °09/30/21 183 lb (83 kg)  °09/08/21 184 lb (83.5 kg)  °  °Physical Exam °Vitals and nursing note reviewed.  °Constitutional:   °   General: He is not in acute distress. °   Appearance: He is well-developed. He is not diaphoretic.  °Eyes:  °   General: No scleral icterus. °   Conjunctiva/sclera: Conjunctivae normal.  °Neck:  °   Thyroid: No thyromegaly.  °Cardiovascular:  °   Rate and Rhythm: Normal rate and regular rhythm.  °   Heart sounds: Normal heart sounds. No murmur heard. °Pulmonary:  °   Effort: Pulmonary effort is normal. No respiratory distress.  °   Breath sounds: Normal breath sounds. No wheezing.  °Musculoskeletal:     °   General: Normal range of motion.  °   Cervical back: Neck supple.  °Lymphadenopathy:  °   Cervical: No cervical adenopathy.  °Skin: °   General: Skin is warm and dry.  °   Findings: No rash.  °Neurological:  °   Mental Status: He is alert and oriented to person, place, and time.  °   Coordination: Coordination normal.  °Psychiatric:     °   Behavior: Behavior normal.  ° ° °Results for orders placed or performed in visit on 12/31/21  °Bayer DCA Hb A1c Waived  °Result Value Ref Range  ° HB A1C (BAYER DCA - WAIVED) 6.2 (H) 4.8 - 5.6 %  ° ° °Assessment & Plan:  ° °Problem List Items Addressed This Visit   ° °  ° Cardiovascular and Mediastinum  ° Hypertension associated with diabetes (HCC)  °  Relevant Orders  ° CBC with Differential/Platelet  ° CMP14+EGFR  ° Lipid panel  ° Bayer DCA Hb A1c Waived (Completed)  ° PSA, total and free  °  ° Respiratory  ° COPD (chronic obstructive pulmonary disease) (HCC)  °  ° Endocrine  ° Hyperlipidemia associated with type 2 diabetes mellitus (HCC)  ° Relevant Orders  ° CBC with Differential/Platelet  ° CMP14+EGFR  °   Lipid panel  ° Bayer DCA Hb A1c Waived (Completed)  ° PSA, total and free  ° Type 2 diabetes mellitus with other specified complication (HCC) - Primary  ° Relevant Orders  ° CBC with Differential/Platelet  ° CMP14+EGFR  ° Lipid panel  ° Bayer DCA Hb A1c Waived (Completed)  ° PSA, total and free  ° °Other Visit Diagnoses   ° ° Primary osteoarthritis of both hands      ° Relevant Medications  ° diclofenac (VOLTAREN) 50 MG EC tablet  ° Diabetic ulcer of toe of right foot associated with type 2 diabetes mellitus, with fat layer exposed (HCC)      ° Relevant Medications  ° amoxicillin-clavulanate (AUGMENTIN) 875-125 MG tablet  ° Other Relevant Orders  ° Ambulatory referral to Podiatry  ° °  °  °Refer for foot ulcer, will see podiatry ASAP, if not needs to be seen here soon again or possibly wound care if they cannot handle it. °Follow up plan: °Return in about 3 months (around 03/31/2022), or if symptoms worsen or fail to improve, for Diabetes hypertension . ° °Counseling provided for all of the vaccine components °Orders Placed This Encounter  °Procedures  ° CBC with Differential/Platelet  ° CMP14+EGFR  ° Lipid panel  ° Bayer DCA Hb A1c Waived  ° PSA, total and free  ° Ambulatory referral to Podiatry  ° ° °Joshua Dettinger, MD °[facility] °12/31/2021, 3:47 PM ° ° °  °

## 2021-12-31 NOTE — Addendum Note (Signed)
Addended by: Dorene Sorrow on: 12/31/2021 04:05 PM   Modules accepted: Orders

## 2022-01-01 LAB — CBC WITH DIFFERENTIAL/PLATELET
Basophils Absolute: 0 10*3/uL (ref 0.0–0.2)
Basos: 1 %
EOS (ABSOLUTE): 0.1 10*3/uL (ref 0.0–0.4)
Eos: 2 %
Hematocrit: 34.8 % — ABNORMAL LOW (ref 37.5–51.0)
Hemoglobin: 11.1 g/dL — ABNORMAL LOW (ref 13.0–17.7)
Immature Grans (Abs): 0 10*3/uL (ref 0.0–0.1)
Immature Granulocytes: 1 %
Lymphocytes Absolute: 1.4 10*3/uL (ref 0.7–3.1)
Lymphs: 24 %
MCH: 29.1 pg (ref 26.6–33.0)
MCHC: 31.9 g/dL (ref 31.5–35.7)
MCV: 91 fL (ref 79–97)
Monocytes Absolute: 0.9 10*3/uL (ref 0.1–0.9)
Monocytes: 15 %
Neutrophils Absolute: 3.5 10*3/uL (ref 1.4–7.0)
Neutrophils: 57 %
Platelets: 172 10*3/uL (ref 150–450)
RBC: 3.82 x10E6/uL — ABNORMAL LOW (ref 4.14–5.80)
RDW: 14.5 % (ref 11.6–15.4)
WBC: 6 10*3/uL (ref 3.4–10.8)

## 2022-01-01 LAB — PSA, TOTAL AND FREE
PSA, Free Pct: 53.3 %
PSA, Free: 0.16 ng/mL
Prostate Specific Ag, Serum: 0.3 ng/mL (ref 0.0–4.0)

## 2022-01-01 LAB — CMP14+EGFR
ALT: 26 IU/L (ref 0–44)
AST: 18 IU/L (ref 0–40)
Albumin/Globulin Ratio: 1 — ABNORMAL LOW (ref 1.2–2.2)
Albumin: 3.6 g/dL — ABNORMAL LOW (ref 3.7–4.7)
Alkaline Phosphatase: 122 IU/L — ABNORMAL HIGH (ref 44–121)
BUN/Creatinine Ratio: 15 (ref 10–24)
BUN: 22 mg/dL (ref 8–27)
Bilirubin Total: 0.5 mg/dL (ref 0.0–1.2)
CO2: 24 mmol/L (ref 20–29)
Calcium: 9.3 mg/dL (ref 8.6–10.2)
Chloride: 98 mmol/L (ref 96–106)
Creatinine, Ser: 1.5 mg/dL — ABNORMAL HIGH (ref 0.76–1.27)
Globulin, Total: 3.6 g/dL (ref 1.5–4.5)
Glucose: 103 mg/dL — ABNORMAL HIGH (ref 70–99)
Potassium: 4.9 mmol/L (ref 3.5–5.2)
Sodium: 137 mmol/L (ref 134–144)
Total Protein: 7.2 g/dL (ref 6.0–8.5)
eGFR: 48 mL/min/{1.73_m2} — ABNORMAL LOW (ref >59)

## 2022-01-01 LAB — LIPID PANEL
Chol/HDL Ratio: 4.7 ratio (ref 0.0–5.0)
Cholesterol, Total: 135 mg/dL (ref 100–199)
HDL: 29 mg/dL — ABNORMAL LOW (ref >39)
LDL Chol Calc (NIH): 69 mg/dL (ref 0–99)
Triglycerides: 222 mg/dL — ABNORMAL HIGH (ref 0–149)
VLDL Cholesterol Cal: 37 mg/dL (ref 5–40)

## 2022-01-04 DIAGNOSIS — L97511 Non-pressure chronic ulcer of other part of right foot limited to breakdown of skin: Secondary | ICD-10-CM | POA: Diagnosis not present

## 2022-01-18 ENCOUNTER — Other Ambulatory Visit: Payer: Medicare Other

## 2022-01-18 DIAGNOSIS — E1169 Type 2 diabetes mellitus with other specified complication: Secondary | ICD-10-CM

## 2022-01-18 DIAGNOSIS — L97511 Non-pressure chronic ulcer of other part of right foot limited to breakdown of skin: Secondary | ICD-10-CM | POA: Diagnosis not present

## 2022-01-19 LAB — CBC WITH DIFFERENTIAL/PLATELET
Basophils Absolute: 0 10*3/uL (ref 0.0–0.2)
Basos: 1 %
EOS (ABSOLUTE): 0.1 10*3/uL (ref 0.0–0.4)
Eos: 2 %
Hematocrit: 34.2 % — ABNORMAL LOW (ref 37.5–51.0)
Hemoglobin: 11.3 g/dL — ABNORMAL LOW (ref 13.0–17.7)
Immature Grans (Abs): 0 10*3/uL (ref 0.0–0.1)
Immature Granulocytes: 0 %
Lymphocytes Absolute: 1.3 10*3/uL (ref 0.7–3.1)
Lymphs: 28 %
MCH: 29.5 pg (ref 26.6–33.0)
MCHC: 33 g/dL (ref 31.5–35.7)
MCV: 89 fL (ref 79–97)
Monocytes Absolute: 0.7 10*3/uL (ref 0.1–0.9)
Monocytes: 15 %
Neutrophils Absolute: 2.4 10*3/uL (ref 1.4–7.0)
Neutrophils: 54 %
Platelets: 170 10*3/uL (ref 150–450)
RBC: 3.83 x10E6/uL — ABNORMAL LOW (ref 4.14–5.80)
RDW: 14.4 % (ref 11.6–15.4)
WBC: 4.6 10*3/uL (ref 3.4–10.8)

## 2022-01-19 LAB — BMP8+EGFR
BUN/Creatinine Ratio: 17 (ref 10–24)
BUN: 24 mg/dL (ref 8–27)
CO2: 24 mmol/L (ref 20–29)
Calcium: 9.8 mg/dL (ref 8.6–10.2)
Chloride: 97 mmol/L (ref 96–106)
Creatinine, Ser: 1.4 mg/dL — ABNORMAL HIGH (ref 0.76–1.27)
Glucose: 132 mg/dL — ABNORMAL HIGH (ref 70–99)
Potassium: 5.5 mmol/L — ABNORMAL HIGH (ref 3.5–5.2)
Sodium: 136 mmol/L (ref 134–144)
eGFR: 52 mL/min/{1.73_m2} — ABNORMAL LOW (ref >59)

## 2022-02-02 ENCOUNTER — Telehealth: Payer: Self-pay | Admitting: *Deleted

## 2022-02-02 DIAGNOSIS — J439 Emphysema, unspecified: Secondary | ICD-10-CM

## 2022-02-02 MED ORDER — BREO ELLIPTA 100-25 MCG/ACT IN AEPB: 1.0000 | INHALATION_SPRAY | Freq: Every day | RESPIRATORY_TRACT | 2 refills | Status: DC

## 2022-02-02 NOTE — Telephone Encounter (Signed)
FLUTIC/ VILAN INH 100-25 is not covered by pts 2023 insurance plan. They have gave a TEMP supply and we need to change the med   Suggested alternatives:  Breo-name brand Symbicort- name brand Advair HFA -name brand Fluticasone/ Salmeterol (AIRDUO) [facility] name brand

## 2022-02-02 NOTE — Telephone Encounter (Signed)
Spoke with son. He is not on [employer]. He will complete form at next visit. Only informed son that there was a new Rx at the pharmacy.

## 2022-02-02 NOTE — Telephone Encounter (Signed)
I sent Breo brand name for the patient because it says that was going to be covered as an alternative.

## 2022-02-02 NOTE — Telephone Encounter (Signed)
Pt not available. Need to cal back after 2 today

## 2022-02-08 DIAGNOSIS — L97511 Non-pressure chronic ulcer of other part of right foot limited to breakdown of skin: Secondary | ICD-10-CM | POA: Diagnosis not present

## 2022-03-16 ENCOUNTER — Telehealth: Payer: Self-pay | Admitting: *Deleted

## 2022-03-16 NOTE — Telephone Encounter (Signed)
This medication or product is on your plan's list of covered drugs. Prior authorization is not required at this time. If your pharmacy has questions regarding the processing of your prescription, please have them call the [facility] pharmacy help desk at [phone]. **Please note: This request was submitted electronically. Formulary lowering, tiering exception, cost reduction and/or pre-benefit determination review (including prospective Medicare hospice reviews) requests cannot be requested using this method of submission. Providers contact us at [phone] for further assistance.

## 2022-04-01 ENCOUNTER — Ambulatory Visit: Payer: Medicare Other | Admitting: Family Medicine

## 2022-04-04 ENCOUNTER — Other Ambulatory Visit: Payer: Self-pay | Admitting: Family Medicine

## 2022-04-04 DIAGNOSIS — E1159 Type 2 diabetes mellitus with other circulatory complications: Secondary | ICD-10-CM

## 2022-04-18 ENCOUNTER — Encounter: Payer: Self-pay | Admitting: Family Medicine

## 2022-04-18 ENCOUNTER — Ambulatory Visit (INDEPENDENT_AMBULATORY_CARE_PROVIDER_SITE_OTHER): Payer: Medicare Other | Admitting: Family Medicine

## 2022-04-18 VITALS — BP 141/85 | HR 86 | Ht 72.0 in | Wt 172.0 lb

## 2022-04-18 DIAGNOSIS — E785 Hyperlipidemia, unspecified: Secondary | ICD-10-CM

## 2022-04-18 DIAGNOSIS — M19042 Primary osteoarthritis, left hand: Secondary | ICD-10-CM | POA: Diagnosis not present

## 2022-04-18 DIAGNOSIS — M19041 Primary osteoarthritis, right hand: Secondary | ICD-10-CM | POA: Diagnosis not present

## 2022-04-18 DIAGNOSIS — I152 Hypertension secondary to endocrine disorders: Secondary | ICD-10-CM

## 2022-04-18 DIAGNOSIS — E1159 Type 2 diabetes mellitus with other circulatory complications: Secondary | ICD-10-CM | POA: Diagnosis not present

## 2022-04-18 DIAGNOSIS — E1169 Type 2 diabetes mellitus with other specified complication: Secondary | ICD-10-CM

## 2022-04-18 LAB — BAYER DCA HB A1C WAIVED: HB A1C (BAYER DCA - WAIVED): 6.1 % — ABNORMAL HIGH (ref 4.8–5.6)

## 2022-04-18 MED ORDER — FUROSEMIDE 20 MG PO TABS: 20.0000 mg | ORAL_TABLET | Freq: Every day | ORAL | 1 refills | Status: DC

## 2022-04-18 MED ORDER — CELECOXIB 100 MG PO CAPS: 100.0000 mg | ORAL_CAPSULE | Freq: Two times a day (BID) | ORAL | 1 refills | Status: DC

## 2022-04-18 MED ORDER — LISINOPRIL 10 MG PO TABS: 10.0000 mg | ORAL_TABLET | Freq: Every day | ORAL | 1 refills | Status: DC

## 2022-04-18 MED ORDER — PREDNISONE 20 MG PO TABS: ORAL_TABLET | ORAL | 0 refills | Status: DC

## 2022-04-18 NOTE — Progress Notes (Signed)
? ?BP (!) 141/85   Pulse 86   Ht 6' (1.829 m)   Wt 172 lb (78 kg)   SpO2 96%   BMI 23.33 kg/m?   ? ?Subjective:  ? ?Patient ID: Ray Carlson, male    DOB: November 04, 1946, 76 y.o.   MRN: [id number] ? ?HPI: ?Ray Carlson is a 76 y.o. male presenting on 04/18/2022 for Medical Management of Chronic Issues, Diabetes, Hypertension, and Arthritis (Both hands. Voltaren gel/oral does not help) ? ? ?HPI ?Type 2 diabetes mellitus ?Patient comes in today for recheck of his diabetes. Patient has been currently taking glipizide and metformin and Januvia, A1c looks good at 6.1. Patient is currently on an ACE inhibitor/ARB. Patient has not seen an ophthalmologist this year. Patient denies any issues with their feet. The symptom started onset as an adult hypertension and hyperlipidemia ARE RELATED TO DM  ? ?Hypertension ?Patient is currently on furosemide and metoprolol and lisinopril, and their blood pressure today is 141/85. Patient denies any lightheadedness or dizziness. Patient denies headaches, blurred vision, chest pains, shortness of breath, or weakness. Denies any side effects from medication and is content with current medication.  ? ?Hyperlipidemia ?Patient is coming in for recheck of his hyperlipidemia. The patient is currently taking pravastatin. They deny any issues with myalgias or history of liver damage from it. They deny any focal numbness or weakness or chest pain.  ? ?Patient's biggest complaint today coming in is that he still has a lot of arthritis and swelling in his hands and is getting to the point where it is affecting his ability to walk because he has to use a cane or a walker and the pain is preventing him from being able to do that consistently.  He does have a lot of swelling in both of his hands, even more in his right hand than his left.  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 ? ?Relevant past medical, surgical, family and social history reviewed and updated as indicated. Interim  medical history since our last visit reviewed. ?Allergies and medications reviewed and updated. ? ?Review of Systems  ?Constitutional:  Negative for chills and fever.  ?Eyes:  Negative for visual disturbance.  ?Respiratory:  Negative for shortness of breath and wheezing.   ?Cardiovascular:  Negative for chest pain and leg swelling.  ?Musculoskeletal:  Positive for arthralgias and joint swelling. Negative for back pain and gait problem.  ?Skin:  Negative for rash.  ?Neurological:  Negative for dizziness, weakness and light-headedness.  ?All other systems reviewed and are negative. ? ?Per HPI unless specifically indicated above ? ? ?Allergies as of 04/18/2022   ? ?   Reactions  ? Penicillins   ? ?  ? ?  ?Medication List  ?  ? ?  ? Accurate as of Apr 18, 2022  3:54 PM. If you have any questions, ask your nurse or doctor.  ?  ?  ? ?  ? ?STOP taking these medications   ? ?amoxicillin-clavulanate 875-125 MG tablet ?Commonly known as: AUGMENTIN ?Stopped by: Worthy Rancher, MD ?  ?diclofenac 50 MG EC tablet ?Commonly known as: VOLTAREN ?Stopped by: Worthy Rancher, MD ?  ? ?  ? ?TAKE these medications   ? ?Accu-Chek Aviva Plus test strip ?Generic drug: glucose blood ?Test bid. E11.9 ?  ?Accu-Chek Aviva Plus w/Device Kit ?1 each by Does not apply route daily. ?  ?Breo Ellipta 100-25 MCG/ACT Aepb ?Generic drug: fluticasone furoate-vilanterol ?  Inhale 1 puff into the lungs daily. ?  ?celecoxib 100 MG capsule ?Commonly known as: CeleBREX ?Take 1 capsule (100 mg total) by mouth 2 (two) times daily. ?Started by: Worthy Rancher, MD ?  ?diclofenac Sodium 1 % Gel ?Commonly known as: Voltaren ?Apply 2 g topically 4 (four) times daily. ?  ?furosemide 20 MG tablet ?Commonly known as: LASIX ?Take 1 tablet (20 mg total) by mouth daily. ?  ?glipiZIDE 5 MG tablet ?Commonly known as: GLUCOTROL ?Take 1 tablet (5 mg total) by mouth 2 (two) times daily. ?  ?lisinopril 10 MG tablet ?Commonly known as: ZESTRIL ?Take 1 tablet (10 mg total)  by mouth daily. ?  ?metFORMIN 500 MG tablet ?Commonly known as: GLUCOPHAGE ?Take 2 tablets (1,000 mg total) by mouth 2 (two) times daily. ?  ?metoprolol tartrate 50 MG tablet ?Commonly known as: LOPRESSOR ?Take 1 tablet (50 mg total) by mouth 2 (two) times daily. ?  ?pravastatin 20 MG tablet ?Commonly known as: PRAVACHOL ?Take 1 tablet (20 mg total) by mouth daily. ?  ?predniSONE 20 MG tablet ?Commonly known as: DELTASONE ?2 po at same time daily for 5 days ?Started by: Worthy Rancher, MD ?  ?sitaGLIPtin 100 MG tablet ?Commonly known as: Januvia ?Take 1 tablet (100 mg total) by mouth daily. ?  ?Spiriva HandiHaler 18 MCG inhalation capsule ?Generic drug: tiotropium ?Place 1 capsule (18 mcg total) into inhaler and inhale daily. ?  ?VITAMIN B 12 PO ?Take 1,000 mg by mouth daily. ?  ? ?  ? ? ? ?Objective:  ? ?BP (!) 141/85   Pulse 86   Ht 6' (1.829 m)   Wt 172 lb (78 kg)   SpO2 96%   BMI 23.33 kg/m?   ?Wt Readings from Last 3 Encounters:  ?04/18/22 172 lb (78 kg)  ?12/31/21 183 lb (83 kg)  ?09/30/21 183 lb (83 kg)  ?  ?Physical Exam ?Vitals and nursing note reviewed.  ?Constitutional:   ?   General: He is not in acute distress. ?   Appearance: He is well-developed. He is not diaphoretic.  ?Eyes:  ?   General: No scleral icterus. ?   Conjunctiva/sclera: Conjunctivae normal.  ?Neck:  ?   Thyroid: No thyromegaly.  ?Cardiovascular:  ?   Rate and Rhythm: Normal rate and regular rhythm.  ?   Heart sounds: Normal heart sounds. No murmur heard. ?Pulmonary:  ?   Effort: Pulmonary effort is normal. No respiratory distress.  ?   Breath sounds: Normal breath sounds. No wheezing.  ?Musculoskeletal:     ?   General: Tenderness (Joint swelling and tenderness in the MCP joints of both hands, worse in the right) present. No swelling. Normal range of motion.  ?   Cervical back: Neck supple.  ?Lymphadenopathy:  ?   Cervical: No cervical adenopathy.  ?Skin: ?   General: Skin is warm and dry.  ?   Findings: No rash.  ?Neurological:   ?   Mental Status: He is alert and oriented to person, place, and time.  ?   Coordination: Coordination normal.  ?Psychiatric:     ?   Behavior: Behavior normal.  ? ? ? ? ?Assessment & Plan:  ? ?Problem List Items Addressed This Visit   ? ?  ? Cardiovascular and Mediastinum  ? Hypertension associated with diabetes ([place])  ? Relevant Medications  ? furosemide (LASIX) 20 MG tablet  ? lisinopril (ZESTRIL) 10 MG tablet  ?  ? Endocrine  ? Hyperlipidemia associated with  type 2 diabetes mellitus ([place])  ? Relevant Medications  ? furosemide (LASIX) 20 MG tablet  ? lisinopril (ZESTRIL) 10 MG tablet  ? Type 2 diabetes mellitus with other specified complication ([place]) - Primary  ? Relevant Medications  ? lisinopril (ZESTRIL) 10 MG tablet  ? Other Relevant Orders  ? CMP14+EGFR  ? Bayer DCA Hb A1c Waived  ? ?Other Visit Diagnoses   ? ? Primary osteoarthritis of both hands      ? Relevant Medications  ? predniSONE (DELTASONE) 20 MG tablet  ? celecoxib (CELEBREX) 100 MG capsule  ? ?  ?  ?We will switch from diclofenac to Celebrex but will do a short course of prednisone before he starts the Celebrex, continue to use Voltaren gel, he is also going to try some CBD oil and see if that helps. ? ?A1c looks good, blood pressure is borderline but for his age remind permissive hypertension.  No other changes at this point. ?Follow up plan: ?Return in about 3 months (around 07/19/2022), or if symptoms worsen or fail to improve, for Diabetes recheck. ? ?Counseling provided for all of the vaccine components ?Orders Placed This Encounter  ?Procedures  ? CMP14+EGFR  ? Bayer DCA Hb A1c Waived  ? ? ?Caryl Pina, MD ?[place] ?04/18/2022, 3:54 PM ? ? ? ? ?

## 2022-04-19 ENCOUNTER — Ambulatory Visit: Payer: Self-pay | Admitting: *Deleted

## 2022-04-19 LAB — CMP14+EGFR
ALT: 22 IU/L (ref 0–44)
AST: 18 IU/L (ref 0–40)
Albumin/Globulin Ratio: 1.4 (ref 1.2–2.2)
Albumin: 4.1 g/dL (ref 3.7–4.7)
Alkaline Phosphatase: 82 IU/L (ref 44–121)
BUN/Creatinine Ratio: 18 (ref 10–24)
BUN: 21 mg/dL (ref 8–27)
Bilirubin Total: 0.4 mg/dL (ref 0.0–1.2)
CO2: 23 mmol/L (ref 20–29)
Calcium: 9.8 mg/dL (ref 8.6–10.2)
Chloride: 98 mmol/L (ref 96–106)
Creatinine, Ser: 1.17 mg/dL (ref 0.76–1.27)
Globulin, Total: 3 g/dL (ref 1.5–4.5)
Glucose: 101 mg/dL — ABNORMAL HIGH (ref 70–99)
Potassium: 5.2 mmol/L (ref 3.5–5.2)
Sodium: 135 mmol/L (ref 134–144)
Total Protein: 7.1 g/dL (ref 6.0–8.5)
eGFR: 64 mL/min/{1.73_m2} (ref >59)

## 2022-04-19 NOTE — Chronic Care Management (AMB) (Signed)
? ?  04/19/2022 ? ?Excell Seltzer Geiger ?1946-09-25 ?[id number] ? ?Case closed to Chronic Care Management services in primary care home due to lack of patient engagement within the past 6 months. Re-enrollment in CCM services can be initiated in the future by PCP or patient as needed. ? ?Follow Up Plan: No further follow-up required at this time. Patient or PCP may request re-enrollment as necessary. Patient enrollment status has been updated to previously enrolled and RN Care Manager has been removed from the care plan.  ? ?Demetrios Loll, BSN, RN-BC ?Embedded Chronic Care Manager ?[facility] / [facility] Care Management ?Direct Dial: [phone] ? ?

## 2022-04-27 ENCOUNTER — Ambulatory Visit: Payer: Medicare Other | Admitting: Emergency Medicine

## 2022-04-27 ENCOUNTER — Other Ambulatory Visit: Payer: Self-pay | Admitting: Emergency Medicine

## 2022-04-27 VITALS — BP 148/96

## 2022-04-27 DIAGNOSIS — R609 Edema, unspecified: Secondary | ICD-10-CM

## 2022-04-27 DIAGNOSIS — E1169 Type 2 diabetes mellitus with other specified complication: Secondary | ICD-10-CM | POA: Diagnosis not present

## 2022-04-27 LAB — GLUCOSE HEMOCUE WAIVED: Glu Hemocue Waived: 183 mg/dL — ABNORMAL HIGH (ref 70–99)

## 2022-04-27 NOTE — Progress Notes (Signed)
FYI  ? ? ?Patient came in to office with complaint of edema in legs and stomach feeling off. He states that he started taking Celebrex on 04/18/22 and he thinks swelling is coming from that.  ? ?BP today is 161/100 with a repeat of 148/96.  Patient has 1+ pitting edema in leg bilaterally.  ? ?Patient and Son requested for a Blood Glucose- that was 183 ? ?I advised patient we would make an appt for tomorrow with Dr. Louanne Skye.  ? ?Patient and Son verbalized understanding.  ?

## 2022-04-28 ENCOUNTER — Encounter: Payer: Self-pay | Admitting: Family Medicine

## 2022-04-28 ENCOUNTER — Ambulatory Visit (INDEPENDENT_AMBULATORY_CARE_PROVIDER_SITE_OTHER): Payer: Medicare Other | Admitting: Family Medicine

## 2022-04-28 VITALS — BP 151/90 | HR 123 | Ht 72.0 in | Wt 171.0 lb

## 2022-04-28 DIAGNOSIS — E1169 Type 2 diabetes mellitus with other specified complication: Secondary | ICD-10-CM | POA: Diagnosis not present

## 2022-04-28 DIAGNOSIS — B370 Candidal stomatitis: Secondary | ICD-10-CM | POA: Diagnosis not present

## 2022-04-28 DIAGNOSIS — R609 Edema, unspecified: Secondary | ICD-10-CM

## 2022-04-28 MED ORDER — NYSTATIN 100000 UNIT/ML MT SUSP: 5.0000 mL | Freq: Four times a day (QID) | OROMUCOSAL | 0 refills | Status: AC

## 2022-04-28 MED ORDER — ACCU-CHEK AVIVA PLUS W/DEVICE KIT: 1.0000 | PACK | Freq: Every day | 3 refills | Status: AC

## 2022-04-28 NOTE — Progress Notes (Signed)
BP (!) 151/90   Pulse (!) 123   Ht 6' (1.829 m)   Wt 171 lb (77.6 kg)   SpO2 97%   BMI 23.19 kg/m    Subjective:   Patient ID: Ray Carlson, male    DOB: [birth year], 76 y.o.   MRN: [id number]  HPI: Ray Carlson is a 76 y.o. male presenting on 04/28/2022 for Edema (BLE, stomach) and Anxiety (Questions if symptoms are from Prednisone and Celebrex)   HPI Patient is coming in for anxiety.  Patient's GERD is edema better and he is feeling a lot better.  He just said that the prednisone made him a lot of anxiety.  Does need a new meter for his diabetes  Relevant past medical, surgical, family and social history reviewed and updated as indicated. Interim medical history since our last visit reviewed. Allergies and medications reviewed and updated.  Review of Systems  Constitutional:  Negative for chills and fever.  Eyes:  Negative for visual disturbance.  Respiratory:  Negative for shortness of breath and wheezing.   Cardiovascular:  Positive for leg swelling. Negative for chest pain.  Musculoskeletal:  Negative for back pain and gait problem.  Skin:  Negative for rash.  Neurological:  Negative for dizziness, weakness and numbness.  All other systems reviewed and are negative.  Per HPI unless specifically indicated above   Allergies as of 04/28/2022       Reactions   Penicillins         Medication List        Accurate as of Apr 28, 2022  4:37 PM. If you have any questions, ask your nurse or doctor.          STOP taking these medications    celecoxib 100 MG capsule Commonly known as: CeleBREX Stopped by: Worthy Rancher, MD   predniSONE 20 MG tablet Commonly known as: DELTASONE Stopped by: Fransisca Kaufmann Reon Hunley, MD       TAKE these medications    Accu-Chek Aviva Plus test strip Generic drug: glucose blood Test bid. E11.9   Accu-Chek Aviva Plus w/Device Kit 1 each by Does not apply route daily.   Breo Ellipta 100-25 MCG/ACT Aepb Generic drug:  fluticasone furoate-vilanterol Inhale 1 puff into the lungs daily.   diclofenac Sodium 1 % Gel Commonly known as: Voltaren Apply 2 g topically 4 (four) times daily.   furosemide 20 MG tablet Commonly known as: LASIX Take 1 tablet (20 mg total) by mouth daily.   glipiZIDE 5 MG tablet Commonly known as: GLUCOTROL Take 1 tablet (5 mg total) by mouth 2 (two) times daily.   lisinopril 10 MG tablet Commonly known as: ZESTRIL Take 1 tablet (10 mg total) by mouth daily.   metFORMIN 500 MG tablet Commonly known as: GLUCOPHAGE Take 2 tablets (1,000 mg total) by mouth 2 (two) times daily.   metoprolol tartrate 50 MG tablet Commonly known as: LOPRESSOR Take 1 tablet (50 mg total) by mouth 2 (two) times daily.   pravastatin 20 MG tablet Commonly known as: PRAVACHOL Take 1 tablet (20 mg total) by mouth daily.   sitaGLIPtin 100 MG tablet Commonly known as: Januvia Take 1 tablet (100 mg total) by mouth daily.   Spiriva HandiHaler 18 MCG inhalation capsule Generic drug: tiotropium Place 1 capsule (18 mcg total) into inhaler and inhale daily.   VITAMIN B 12 PO Take 1,000 mg by mouth daily.         Objective:   BP (!) 151/90  Pulse (!) 123   Ht 6' (1.829 m)   Wt 171 lb (77.6 kg)   SpO2 97%   BMI 23.19 kg/m   Wt Readings from Last 3 Encounters:  04/28/22 171 lb (77.6 kg)  04/18/22 172 lb (78 kg)  12/31/21 183 lb (83 kg)    Physical Exam Vitals and nursing note reviewed.  Constitutional:      General: He is not in acute distress.    Appearance: He is well-developed. He is not diaphoretic.  Eyes:     General: No scleral icterus.       Right eye: No discharge.     Conjunctiva/sclera: Conjunctivae normal.  Neck:     Thyroid: No thyromegaly.  Cardiovascular:     Rate and Rhythm: Normal rate and regular rhythm.     Heart sounds: Normal heart sounds. No murmur heard. Pulmonary:     Effort: Pulmonary effort is normal. No respiratory distress.     Breath sounds:  Normal breath sounds. No wheezing.  Musculoskeletal:        General: Swelling (1+ bilateral lower extremity) present. Normal range of motion.     Cervical back: Neck supple.  Lymphadenopathy:     Cervical: No cervical adenopathy.  Skin:    General: Skin is warm and dry.     Findings: No rash.  Neurological:     Mental Status: He is alert and oriented to person, place, and time.     Coordination: Coordination normal.  Psychiatric:        Behavior: Behavior normal.      Assessment & Plan:   Problem List Items Addressed This Visit       Endocrine   Type 2 diabetes mellitus with other specified complication ([place]) - Primary   Relevant Medications   Blood Glucose Monitoring Suppl (ACCU-CHEK AVIVA PLUS) w/Device KIT   Other Visit Diagnoses     Swelling           Patient doing a lot better and feeling a lot better.  He is needs new blood sugar meter but his hands are a lot better and the swelling is a lot better.  We will switch to Celebrex now that he did have some anxiety with the prednisone but will just know that may happen in the future.  Patient is going arthritis is doing a lot better. This made him anxious but he is doing a lot better now that he is off of it and the swelling is better and he is really feeling a lot better. Follow up plan: Return if symptoms worsen or fail to improve.  Counseling provided for all of the vaccine components No orders of the defined types were placed in this encounter.   Caryl Pina, MD [facility] 04/28/2022, 4:37 PM

## 2022-04-28 NOTE — Addendum Note (Signed)
Addended by: Arville Care on: 04/28/2022 04:49 PM   Modules accepted: Orders

## 2022-05-05 ENCOUNTER — Ambulatory Visit: Payer: Medicare Other | Admitting: Family Medicine

## 2022-05-10 ENCOUNTER — Encounter: Payer: Self-pay | Admitting: Family Medicine

## 2022-07-21 ENCOUNTER — Ambulatory Visit (INDEPENDENT_AMBULATORY_CARE_PROVIDER_SITE_OTHER): Payer: Medicare Other | Admitting: Family Medicine

## 2022-07-21 ENCOUNTER — Encounter: Payer: Self-pay | Admitting: Family Medicine

## 2022-07-21 VITALS — BP 139/81 | HR 84 | Temp 98.0°F | Ht 72.0 in | Wt 175.0 lb

## 2022-07-21 DIAGNOSIS — I152 Hypertension secondary to endocrine disorders: Secondary | ICD-10-CM | POA: Diagnosis not present

## 2022-07-21 DIAGNOSIS — E785 Hyperlipidemia, unspecified: Secondary | ICD-10-CM | POA: Diagnosis not present

## 2022-07-21 DIAGNOSIS — E1159 Type 2 diabetes mellitus with other circulatory complications: Secondary | ICD-10-CM | POA: Diagnosis not present

## 2022-07-21 DIAGNOSIS — M19041 Primary osteoarthritis, right hand: Secondary | ICD-10-CM | POA: Diagnosis not present

## 2022-07-21 DIAGNOSIS — M19042 Primary osteoarthritis, left hand: Secondary | ICD-10-CM | POA: Diagnosis not present

## 2022-07-21 DIAGNOSIS — E1169 Type 2 diabetes mellitus with other specified complication: Secondary | ICD-10-CM

## 2022-07-21 DIAGNOSIS — J439 Emphysema, unspecified: Secondary | ICD-10-CM

## 2022-07-21 LAB — BAYER DCA HB A1C WAIVED: HB A1C (BAYER DCA - WAIVED): 6.5 % — ABNORMAL HIGH (ref 4.8–5.6)

## 2022-07-21 MED ORDER — PRAVASTATIN SODIUM 20 MG PO TABS: 20.0000 mg | ORAL_TABLET | Freq: Every day | ORAL | 3 refills | Status: DC

## 2022-07-21 MED ORDER — BREO ELLIPTA 100-25 MCG/ACT IN AEPB: 1.0000 | INHALATION_SPRAY | Freq: Every day | RESPIRATORY_TRACT | 2 refills | Status: DC

## 2022-07-21 MED ORDER — SPIRIVA HANDIHALER 18 MCG IN CAPS: 18.0000 ug | ORAL_CAPSULE | Freq: Every day | RESPIRATORY_TRACT | 3 refills | Status: DC

## 2022-07-21 MED ORDER — GLIPIZIDE 5 MG PO TABS: 5.0000 mg | ORAL_TABLET | Freq: Two times a day (BID) | ORAL | 3 refills | Status: DC

## 2022-07-21 MED ORDER — SITAGLIPTIN PHOSPHATE 100 MG PO TABS: 100.0000 mg | ORAL_TABLET | Freq: Every day | ORAL | 3 refills | Status: DC

## 2022-07-21 MED ORDER — METFORMIN HCL 500 MG PO TABS: 1000.0000 mg | ORAL_TABLET | Freq: Two times a day (BID) | ORAL | 3 refills | Status: DC

## 2022-07-21 MED ORDER — DICLOFENAC SODIUM 1 % EX GEL
2.0000 g | Freq: Four times a day (QID) | CUTANEOUS | 3 refills | Status: AC
Start: 2022-07-21 — End: ?

## 2022-07-21 MED ORDER — ACCU-CHEK AVIVA PLUS VI STRP: ORAL_STRIP | 2 refills | Status: DC

## 2022-07-21 MED ORDER — METOPROLOL TARTRATE 50 MG PO TABS: 50.0000 mg | ORAL_TABLET | Freq: Two times a day (BID) | ORAL | 3 refills | Status: DC

## 2022-07-21 NOTE — Progress Notes (Signed)
BP 139/81   Pulse 84   Temp 98 F (36.7 C)   Ht 6' (1.829 m)   Wt 175 lb (79.4 kg)   SpO2 98%   BMI 23.73 kg/m    Subjective:   Patient ID: Ray Carlson, male    DOB: October 30, 1946, 76 y.o.   MRN: [id number]  HPI: Ray Carlson is a 76 y.o. male presenting on 07/21/2022 for Medical Management of Chronic Issues and Diabetes   HPI Type 2 diabetes mellitus Patient comes in today for recheck of his diabetes. Patient has been currently taking glipizide and Januvia. Patient is currently on an ACE inhibitor/ARB. Patient has not seen an ophthalmologist this year. Patient denies any issues with their feet. The symptom started onset as an adult hypertension and hyperlipidemia ARE RELATED TO DM   Hypertension Patient is currently on lisinopril and metoprolol, and their blood pressure today is 139/81. Patient denies any lightheadedness or dizziness. Patient denies headaches, blurred vision, chest pains, shortness of breath, or weakness. Denies any side effects from medication and is content with current medication.   Hyperlipidemia Patient is coming in for recheck of his hyperlipidemia. The patient is currently taking pravastatin. They deny any issues with myalgias or history of liver damage from it. They deny any focal numbness or weakness or chest pain.   COPD Patient is coming in for COPD recheck today.  He is currently on Breo and Spiriva.  He has a mild chronic cough and wheezing but denies any major coughing spells or wheezing spells.  He has 0nighttime symptoms per week and 0daytime symptoms per week currently.   Relevant past medical, surgical, family and social history reviewed and updated as indicated. Interim medical history since our last visit reviewed. Allergies and medications reviewed and updated.  Review of Systems  Constitutional:  Negative for chills and fever.  Eyes:  Negative for discharge.  Respiratory:  Negative for shortness of breath and wheezing.   Cardiovascular:   Negative for chest pain and leg swelling.  Musculoskeletal:  Positive for arthralgias (Patient has right shoulder pain that is been bothering him over the past week but it is improving). Negative for back pain and gait problem.  Skin:  Negative for rash.  All other systems reviewed and are negative.   Per HPI unless specifically indicated above   Allergies as of 07/21/2022       Reactions   Penicillins         Medication List        Accurate as of July 21, 2022  2:28 PM. If you have any questions, ask your nurse or doctor.          Accu-Chek Aviva Plus test strip Generic drug: glucose blood Test bid. E11.9   Accu-Chek Aviva Plus w/Device Kit 1 each by Does not apply route daily.   Breo Ellipta 100-25 MCG/ACT Aepb Generic drug: fluticasone furoate-vilanterol Inhale 1 puff into the lungs daily.   diclofenac Sodium 1 % Gel Commonly known as: Voltaren Apply 2 g topically 4 (four) times daily.   furosemide 20 MG tablet Commonly known as: LASIX Take 1 tablet (20 mg total) by mouth daily.   glipiZIDE 5 MG tablet Commonly known as: GLUCOTROL Take 1 tablet (5 mg total) by mouth 2 (two) times daily.   lisinopril 10 MG tablet Commonly known as: ZESTRIL Take 1 tablet (10 mg total) by mouth daily.   metFORMIN 500 MG tablet Commonly known as: GLUCOPHAGE Take 2 tablets (1,000 mg  total) by mouth 2 (two) times daily.   metoprolol tartrate 50 MG tablet Commonly known as: LOPRESSOR Take 1 tablet (50 mg total) by mouth 2 (two) times daily.   nystatin 100000 UNIT/ML suspension Commonly known as: MYCOSTATIN Take 5 mLs (500,000 Units total) by mouth 4 (four) times daily.   pravastatin 20 MG tablet Commonly known as: PRAVACHOL Take 1 tablet (20 mg total) by mouth daily.   sitaGLIPtin 100 MG tablet Commonly known as: Januvia Take 1 tablet (100 mg total) by mouth daily.   Spiriva HandiHaler 18 MCG inhalation capsule Generic drug: tiotropium Place 1 capsule (18 mcg  total) into inhaler and inhale daily.   VITAMIN B 12 PO Take 1,000 mg by mouth daily.         Objective:   BP 139/81   Pulse 84   Temp 98 F (36.7 C)   Ht 6' (1.829 m)   Wt 175 lb (79.4 kg)   SpO2 98%   BMI 23.73 kg/m   Wt Readings from Last 3 Encounters:  07/21/22 175 lb (79.4 kg)  04/28/22 171 lb (77.6 kg)  04/18/22 172 lb (78 kg)    Physical Exam Vitals and nursing note reviewed.  Constitutional:      General: He is not in acute distress.    Appearance: He is well-developed. He is not diaphoretic.  Eyes:     General: No scleral icterus.    Conjunctiva/sclera: Conjunctivae normal.  Neck:     Thyroid: No thyromegaly.  Cardiovascular:     Rate and Rhythm: Normal rate and regular rhythm.     Heart sounds: Normal heart sounds. No murmur heard. Pulmonary:     Effort: Pulmonary effort is normal. No respiratory distress.     Breath sounds: Wheezing present. No rhonchi or rales.  Chest:     Chest wall: No tenderness.  Musculoskeletal:        General: No swelling.     Cervical back: Neck supple.  Lymphadenopathy:     Cervical: No cervical adenopathy.  Skin:    General: Skin is warm and dry.     Findings: No rash.  Neurological:     Mental Status: He is alert and oriented to person, place, and time.     Coordination: Coordination normal.  Psychiatric:        Behavior: Behavior normal.       Assessment & Plan:   Problem List Items Addressed This Visit       Cardiovascular and Mediastinum   Hypertension associated with diabetes ([place])   Relevant Medications   glipiZIDE (GLUCOTROL) 5 MG tablet   metFORMIN (GLUCOPHAGE) 500 MG tablet   metoprolol tartrate (LOPRESSOR) 50 MG tablet   pravastatin (PRAVACHOL) 20 MG tablet   sitaGLIPtin (JANUVIA) 100 MG tablet   Other Relevant Orders   BMP8+EGFR   CBC with Differential/Platelet   Lipid panel   Bayer DCA Hb A1c Waived     Respiratory   COPD (chronic obstructive pulmonary disease) (HCC)   Relevant  Medications   BREO ELLIPTA 100-25 MCG/ACT AEPB   tiotropium (SPIRIVA HANDIHALER) 18 MCG inhalation capsule     Endocrine   Hyperlipidemia associated with type 2 diabetes mellitus (HCC)   Relevant Medications   glipiZIDE (GLUCOTROL) 5 MG tablet   metFORMIN (GLUCOPHAGE) 500 MG tablet   metoprolol tartrate (LOPRESSOR) 50 MG tablet   pravastatin (PRAVACHOL) 20 MG tablet   sitaGLIPtin (JANUVIA) 100 MG tablet   Other Relevant Orders   BMP8+EGFR   CBC  with Differential/Platelet   Lipid panel   Bayer DCA Hb A1c Waived   Type 2 diabetes mellitus with other specified complication (HCC) - Primary   Relevant Medications   glucose blood (ACCU-CHEK AVIVA PLUS) test strip   glipiZIDE (GLUCOTROL) 5 MG tablet   metFORMIN (GLUCOPHAGE) 500 MG tablet   pravastatin (PRAVACHOL) 20 MG tablet   sitaGLIPtin (JANUVIA) 100 MG tablet   Other Relevant Orders   BMP8+EGFR   CBC with Differential/Platelet   Lipid panel   Bayer DCA Hb A1c Waived   Other Visit Diagnoses     Primary osteoarthritis of both hands       Relevant Medications   diclofenac Sodium (VOLTAREN) 1 % GEL       Breathing at baseline with wheezing.  Blood pressure looks good.  No change in medication, give some Voltaren gel for his right shoulder pain. Follow up plan: Return in about 3 months (around 10/21/2022), or if symptoms worsen or fail to improve, for Hypertension and diabetes.  Counseling provided for all of the vaccine components Orders Placed This Encounter  Procedures   BMP8+EGFR   CBC with Differential/Platelet   Lipid panel   Bayer DCA Hb A1c Waived    Caryl Pina, MD [facility] 07/21/2022, 2:28 PM

## 2022-07-22 LAB — CBC WITH DIFFERENTIAL/PLATELET
Basophils Absolute: 0 10*3/uL (ref 0.0–0.2)
Basos: 1 %
EOS (ABSOLUTE): 0.1 10*3/uL (ref 0.0–0.4)
Eos: 2 %
Hematocrit: 29.3 % — ABNORMAL LOW (ref 37.5–51.0)
Hemoglobin: 9.4 g/dL — ABNORMAL LOW (ref 13.0–17.7)
Immature Grans (Abs): 0 10*3/uL (ref 0.0–0.1)
Immature Granulocytes: 1 %
Lymphocytes Absolute: 1 10*3/uL (ref 0.7–3.1)
Lymphs: 20 %
MCH: 28.1 pg (ref 26.6–33.0)
MCHC: 32.1 g/dL (ref 31.5–35.7)
MCV: 88 fL (ref 79–97)
Monocytes Absolute: 0.9 10*3/uL (ref 0.1–0.9)
Monocytes: 16 %
Neutrophils Absolute: 3.2 10*3/uL (ref 1.4–7.0)
Neutrophils: 60 %
Platelets: 198 10*3/uL (ref 150–450)
RBC: 3.35 x10E6/uL — ABNORMAL LOW (ref 4.14–5.80)
RDW: 14.8 % (ref 11.6–15.4)
WBC: 5.2 10*3/uL (ref 3.4–10.8)

## 2022-07-22 LAB — LIPID PANEL
Chol/HDL Ratio: 2.8 ratio (ref 0.0–5.0)
Cholesterol, Total: 128 mg/dL (ref 100–199)
HDL: 46 mg/dL (ref >39)
LDL Chol Calc (NIH): 64 mg/dL (ref 0–99)
Triglycerides: 98 mg/dL (ref 0–149)
VLDL Cholesterol Cal: 18 mg/dL (ref 5–40)

## 2022-07-22 LAB — BMP8+EGFR
BUN/Creatinine Ratio: 16 (ref 10–24)
BUN: 23 mg/dL (ref 8–27)
CO2: 22 mmol/L (ref 20–29)
Calcium: 9.3 mg/dL (ref 8.6–10.2)
Chloride: 96 mmol/L (ref 96–106)
Creatinine, Ser: 1.47 mg/dL — ABNORMAL HIGH (ref 0.76–1.27)
Glucose: 128 mg/dL — ABNORMAL HIGH (ref 70–99)
Potassium: 4.7 mmol/L (ref 3.5–5.2)
Sodium: 133 mmol/L — ABNORMAL LOW (ref 134–144)
eGFR: 49 mL/min/{1.73_m2} — ABNORMAL LOW (ref >59)

## 2022-08-01 ENCOUNTER — Encounter: Payer: Self-pay | Admitting: *Deleted

## 2022-08-01 ENCOUNTER — Ambulatory Visit: Payer: Medicare Other | Admitting: *Deleted

## 2022-08-01 NOTE — Patient Instructions (Signed)
Visit Information  Thank you for taking time to visit with me today. Please don't hesitate to contact me if I can be of assistance to you.   Please call the care guide team at [phone] if you need to cancel or reschedule your appointment.   If you are experiencing a Mental Health or Behavioral Health Crisis or need someone to talk to, please call the Suicide and Crisis Lifeline: 988 call the [place] National Suicide Prevention Lifeline: 1-800-273-8255 or TTY: 1-800-799-4 TTY (1-800-799-4889) to talk to a trained counselor call 1-800-273-TALK (toll free, 24 hour hotline) go to [facility] [address], [place] ([phone]) call the [place] Crisis Line: 800-939-9988 call 911  Patient verbalizes understanding of instructions and care plan provided today and agrees to view in MyChart. Active MyChart status and patient understanding of how to access instructions and care plan via MyChart confirmed with patient.     No further follow up required.  Rayona Sardinha, BSW, MSW, LCSW  Licensed Clinical Social Worker  [facility]  Mailing Address-[address], [place], [place] [address] Physical Address-[address], [place], [place] [address] Toll Free Main # [phone] Fax # [phone] Cell # [phone] [email or web address]            

## 2022-08-01 NOTE — Patient Outreach (Signed)
  Care Coordination   Initial Visit Note   08/01/2022  Name: Ray Carlson MRN: [id number] DOB: 11-Jun-1946  Ray Carlson is a 76 y.o. year old male who sees Dettinger, Elige Radon, MD for primary care. I spoke with Ray Carlson and son, Emanuel Dowson by phone today.  What matters to the patients health and wellness today?  No Intervention Indicated.   Goals Addressed   None     SDOH assessments and interventions completed:  Yes.   SDOH Interventions Today    Flowsheet Row Most Recent Value  SDOH Interventions   Food Insecurity Interventions Intervention Not Indicated, Other (Comment)  [Verified by son, Ricky Mignogna]  Financial Strain Interventions Intervention Not Indicated, Other (Comment)  [Verified by son, Clide Cliff Pollard]  Housing Interventions Intervention Not Indicated, Other (Comment)  [Verified by son, Clide Cliff Tiu]  Physical Activity Interventions Patient Refused, Other (Comments)  [Verified by son, Clide Cliff [place]]  Stress Interventions Intervention Not Indicated, Other (Comment)  [Verified by son, Clide Cliff Rueda]  Social Connections Interventions Intervention Not Indicated, Other (Comment)  [Verified by son, Clide Cliff Linhares]  Transportation Interventions Intervention Not Indicated, Other (Comment)  [Verified by son, Clide Cliff Valko]        Care Coordination Interventions Activated:  Yes.   Care Coordination Interventions:  Yes, provided.   Follow up plan: No further intervention required.   Encounter Outcome:  Pt. Visit Completed.   Danford Bad, BSW, MSW, LCSW  Licensed Restaurant manager, [employer] [facility]  Mailing [place] [address] [place], [place] [address] Physical Address-[address] [place], [place] [address] Toll Free Main # [phone] Fax # [phone] Cell # [phone] [email or web address]

## 2022-10-24 ENCOUNTER — Ambulatory Visit (INDEPENDENT_AMBULATORY_CARE_PROVIDER_SITE_OTHER): Payer: Medicare Other | Admitting: Family Medicine

## 2022-10-24 ENCOUNTER — Encounter: Payer: Self-pay | Admitting: Family Medicine

## 2022-10-24 VITALS — BP 128/73 | HR 86 | Temp 97.4°F | Ht 72.0 in | Wt 171.0 lb

## 2022-10-24 DIAGNOSIS — E785 Hyperlipidemia, unspecified: Secondary | ICD-10-CM | POA: Diagnosis not present

## 2022-10-24 DIAGNOSIS — E1159 Type 2 diabetes mellitus with other circulatory complications: Secondary | ICD-10-CM

## 2022-10-24 DIAGNOSIS — J439 Emphysema, unspecified: Secondary | ICD-10-CM

## 2022-10-24 DIAGNOSIS — I152 Hypertension secondary to endocrine disorders: Secondary | ICD-10-CM | POA: Diagnosis not present

## 2022-10-24 DIAGNOSIS — E1169 Type 2 diabetes mellitus with other specified complication: Secondary | ICD-10-CM | POA: Diagnosis not present

## 2022-10-24 DIAGNOSIS — Z23 Encounter for immunization: Secondary | ICD-10-CM | POA: Diagnosis not present

## 2022-10-24 LAB — BAYER DCA HB A1C WAIVED: HB A1C (BAYER DCA - WAIVED): 7 % — ABNORMAL HIGH (ref 4.8–5.6)

## 2022-10-24 MED ORDER — BREO ELLIPTA 100-25 MCG/ACT IN AEPB: 1.0000 | INHALATION_SPRAY | Freq: Every day | RESPIRATORY_TRACT | 3 refills | Status: DC

## 2022-10-24 MED ORDER — ALBUTEROL SULFATE HFA 108 (90 BASE) MCG/ACT IN AERS: 2.0000 | INHALATION_SPRAY | Freq: Four times a day (QID) | RESPIRATORY_TRACT | 3 refills | Status: DC | PRN

## 2022-10-24 MED ORDER — TIOTROPIUM BROMIDE MONOHYDRATE 18 MCG IN CAPS: 18.0000 ug | ORAL_CAPSULE | Freq: Every day | RESPIRATORY_TRACT | 3 refills | Status: DC

## 2022-10-24 NOTE — Progress Notes (Signed)
BP 128/73   Pulse 86   Temp (!) 97.4 F (36.3 C)   Ht 6' (1.829 m)   Wt 171 lb (77.6 kg)   SpO2 98%   BMI 23.19 kg/m    Subjective:   Patient ID: Ray Carlson, male    DOB: 26-May-1946, 76 y.o.   MRN: [id number]  HPI: Ray Carlson is a 76 y.o. male presenting on 10/24/2022 for Medical Management of Chronic Issues and Diabetes   HPI Type 2 diabetes mellitus Patient comes in today for recheck of his diabetes. Patient has been currently taking glipizide and metformin and Januvia. Patient is currently on an ACE inhibitor/ARB. Patient has not seen an ophthalmologist this year. Patient denies any issues with their feet. The symptom started onset as an adult hypertension and hyperlipidemia and COPD ARE RELATED TO DM   Hypertension Patient is currently on furosemide and lisinopril and metoprolol, and their blood pressure today is 128/73. Patient denies any lightheadedness or dizziness. Patient denies headaches, blurred vision, chest pains, shortness of breath, or weakness. Denies any side effects from medication and is content with current medication.   Hyperlipidemia Patient is coming in for recheck of his hyperlipidemia. The patient is currently taking pravastatin. They deny any issues with myalgias or history of liver damage from it. They deny any focal numbness or weakness or chest pain.   COPD Patient is coming in for COPD recheck today.  He is currently on [place].  He has a mild chronic cough but denies any major coughing spells or wheezing spells.  He has 1 nighttime symptoms per week and 2 daytime symptoms per week currently.  He feels like he is doing well and has been stable.  Relevant past medical, surgical, family and social history reviewed and updated as indicated. Interim medical history since our last visit reviewed. Allergies and medications reviewed and updated.  Review of Systems  Constitutional:  Negative for chills and fever.  HENT:  Negative for congestion.    Eyes:  Negative for discharge.  Respiratory:  Positive for cough. Negative for chest tightness, shortness of breath and wheezing.   Cardiovascular:  Positive for leg swelling. Negative for chest pain.  Musculoskeletal:  Negative for back pain and gait problem.  Skin:  Negative for rash.  All other systems reviewed and are negative.   Per HPI unless specifically indicated above   Allergies as of 10/24/2022       Reactions   Penicillins         Medication List        Accurate as of October 24, 2022  2:41 PM. If you have any questions, ask your nurse or doctor.          Accu-Chek Aviva Plus test strip Generic drug: glucose blood Test bid. E11.9   Accu-Chek Aviva Plus w/Device Kit 1 each by Does not apply route daily.   albuterol 108 (90 Base) MCG/ACT inhaler Commonly known as: VENTOLIN HFA Inhale 2 puffs into the lungs every 6 (six) hours as needed for wheezing or shortness of breath. Started by: Fransisca Kaufmann Ronell Boldin, MD   Breo Ellipta 100-25 MCG/ACT Aepb Generic drug: fluticasone furoate-vilanterol Inhale 1 puff into the lungs daily.   diclofenac Sodium 1 % Gel Commonly known as: Voltaren Apply 2 g topically 4 (four) times daily.   furosemide 20 MG tablet Commonly known as: LASIX Take 1 tablet (20 mg total) by mouth daily.   glipiZIDE 5 MG tablet Commonly known as:  GLUCOTROL Take 1 tablet (5 mg total) by mouth 2 (two) times daily.   lisinopril 10 MG tablet Commonly known as: ZESTRIL Take 1 tablet (10 mg total) by mouth daily.   metFORMIN 500 MG tablet Commonly known as: GLUCOPHAGE Take 2 tablets (1,000 mg total) by mouth 2 (two) times daily.   metoprolol tartrate 50 MG tablet Commonly known as: LOPRESSOR Take 1 tablet (50 mg total) by mouth 2 (two) times daily.   nystatin 100000 UNIT/ML suspension Commonly known as: MYCOSTATIN Take 5 mLs (500,000 Units total) by mouth 4 (four) times daily.   pravastatin 20 MG tablet Commonly known as:  PRAVACHOL Take 1 tablet (20 mg total) by mouth daily.   sitaGLIPtin 100 MG tablet Commonly known as: Januvia Take 1 tablet (100 mg total) by mouth daily.   tiotropium 18 MCG inhalation capsule Commonly known as: Spiriva HandiHaler Place 1 capsule (18 mcg total) into inhaler and inhale daily.   VITAMIN B 12 PO Take 1,000 mg by mouth daily.         Objective:   BP 128/73   Pulse 86   Temp (!) 97.4 F (36.3 C)   Ht 6' (1.829 m)   Wt 171 lb (77.6 kg)   SpO2 98%   BMI 23.19 kg/m   Wt Readings from Last 3 Encounters:  10/24/22 171 lb (77.6 kg)  07/21/22 175 lb (79.4 kg)  04/28/22 171 lb (77.6 kg)    Physical Exam Vitals and nursing note reviewed.  Constitutional:      General: He is not in acute distress.    Appearance: He is well-developed. He is not diaphoretic.  Eyes:     General: No scleral icterus.    Conjunctiva/sclera: Conjunctivae normal.  Neck:     Thyroid: No thyromegaly.  Cardiovascular:     Rate and Rhythm: Normal rate and regular rhythm.     Heart sounds: Normal heart sounds. No murmur heard. Pulmonary:     Effort: Pulmonary effort is normal. No respiratory distress.     Breath sounds: Normal breath sounds. No wheezing.  Musculoskeletal:        General: Swelling (Trace) present. Normal range of motion.     Cervical back: Neck supple.  Lymphadenopathy:     Cervical: No cervical adenopathy.  Skin:    General: Skin is warm and dry.     Findings: No rash.  Neurological:     Mental Status: He is alert and oriented to person, place, and time.     Coordination: Coordination normal.  Psychiatric:        Behavior: Behavior normal.       Assessment & Plan:   Problem List Items Addressed This Visit       Cardiovascular and Mediastinum   Hypertension associated with diabetes ([place])     Respiratory   COPD (chronic obstructive pulmonary disease) (HCC)   Relevant Medications   albuterol (VENTOLIN HFA) 108 (90 Base) MCG/ACT inhaler   BREO ELLIPTA  100-25 MCG/ACT AEPB   tiotropium (SPIRIVA HANDIHALER) 18 MCG inhalation capsule     Endocrine   Hyperlipidemia associated with type 2 diabetes mellitus ([place])   Type 2 diabetes mellitus with other specified complication ([place]) - Primary   Relevant Orders   Bayer DCA Hb A1c Waived    A1c is 7.0 which is slightly up but still within range, no medication changes, just focus on diet.  Blood pressure looks good, heart rate looks good.  No changes Follow up plan: Return in  about 3 months (around 01/24/2023), or if symptoms worsen or fail to improve, for Diabetes and hypertension and cholesterol.  Counseling provided for all of the vaccine components Orders Placed This Encounter  Procedures   Bayer [place] Hb A1c Duquesne Semira Stoltzfus, MD [facility] 10/24/2022, 2:41 PM

## 2022-11-18 NOTE — Progress Notes (Signed)
[facility] Quality Team Note  Name: Ray Carlson Date of Birth: Jun 06, 1946 MRN: [id number] Date: 11/18/2022  [facility] Quality Team has reviewed this patient's chart, please see recommendations below:  [facility] Quality Other; (KED: Kidney Health Evaluation Gap- Patient needs Urine Albumin Creatinine Ratio Test completed for gap closure. EGFR has already been completed).

## 2023-01-27 ENCOUNTER — Ambulatory Visit (INDEPENDENT_AMBULATORY_CARE_PROVIDER_SITE_OTHER): Payer: Medicare Other | Admitting: Family Medicine

## 2023-01-27 ENCOUNTER — Encounter: Payer: Self-pay | Admitting: Family Medicine

## 2023-01-27 VITALS — BP 138/67 | HR 78 | Ht 72.0 in | Wt 164.0 lb

## 2023-01-27 DIAGNOSIS — I152 Hypertension secondary to endocrine disorders: Secondary | ICD-10-CM | POA: Diagnosis not present

## 2023-01-27 DIAGNOSIS — E1169 Type 2 diabetes mellitus with other specified complication: Secondary | ICD-10-CM | POA: Diagnosis not present

## 2023-01-27 DIAGNOSIS — E785 Hyperlipidemia, unspecified: Secondary | ICD-10-CM

## 2023-01-27 DIAGNOSIS — E1159 Type 2 diabetes mellitus with other circulatory complications: Secondary | ICD-10-CM

## 2023-01-27 LAB — BAYER DCA HB A1C WAIVED: HB A1C (BAYER DCA - WAIVED): 7.7 % — ABNORMAL HIGH (ref 4.8–5.6)

## 2023-01-27 NOTE — Progress Notes (Signed)
BP 138/67   Pulse 78   Ht 6' (1.829 m)   Wt 164 lb (74.4 kg)   SpO2 98%   BMI 22.24 kg/m    Subjective:   Patient ID: Ray Carlson, male    DOB: Jun 05, 1946, 77 y.o.   MRN: [id number]  HPI: Ray Carlson is a 77 y.o. male presenting on 01/27/2023 for Medical Management of Chronic Issues, Diabetes, Hyperlipidemia, and Hypertension   HPI Type 2 diabetes mellitus Patient comes in today for recheck of his diabetes. Patient has been currently taking metformin and glipizide and Januvia. Patient is currently on an ACE inhibitor/ARB. Patient has not seen an ophthalmologist this year. Patient denies any issues with their feet. The symptom started onset as an adult hypertension and hyperlipidemia ARE RELATED TO DM   Hypertension Patient is currently on lisinopril, and their blood pressure today is 138/67. Patient denies any lightheadedness or dizziness. Patient denies headaches, blurred vision, chest pains, shortness of breath, or weakness. Denies any side effects from medication and is content with current medication.   Hyperlipidemia Patient is coming in for recheck of his hyperlipidemia. The patient is currently taking pravastatin. They deny any issues with myalgias or history of liver damage from it. They deny any focal numbness or weakness or chest pain.   Relevant past medical, surgical, family and social history reviewed and updated as indicated. Interim medical history since our last visit reviewed. Allergies and medications reviewed and updated.  Review of Systems  Constitutional:  Negative for chills and fever.  Respiratory:  Negative for shortness of breath and wheezing.   Cardiovascular:  Negative for chest pain and leg swelling.  Musculoskeletal:  Negative for back pain and gait problem.  Skin:  Negative for rash.  Neurological:  Negative for dizziness and numbness.  All other systems reviewed and are negative.   Per HPI unless specifically indicated above   Allergies as of  01/27/2023       Reactions   Penicillins         Medication List        Accurate as of January 27, 2023  1:59 PM. If you have any questions, ask your nurse or doctor.          Accu-Chek Aviva Plus test strip Generic drug: glucose blood Test bid. E11.9   Accu-Chek Aviva Plus w/Device Kit 1 each by Does not apply route daily.   albuterol 108 (90 Base) MCG/ACT inhaler Commonly known as: VENTOLIN HFA Inhale 2 puffs into the lungs every 6 (six) hours as needed for wheezing or shortness of breath.   Breo Ellipta 100-25 MCG/ACT Aepb Generic drug: fluticasone furoate-vilanterol Inhale 1 puff into the lungs daily.   diclofenac Sodium 1 % Gel Commonly known as: Voltaren Apply 2 g topically 4 (four) times daily.   furosemide 20 MG tablet Commonly known as: LASIX Take 1 tablet (20 mg total) by mouth daily.   glipiZIDE 5 MG tablet Commonly known as: GLUCOTROL Take 1 tablet (5 mg total) by mouth 2 (two) times daily.   lisinopril 10 MG tablet Commonly known as: ZESTRIL Take 1 tablet (10 mg total) by mouth daily.   metFORMIN 500 MG tablet Commonly known as: GLUCOPHAGE Take 2 tablets (1,000 mg total) by mouth 2 (two) times daily.   metoprolol tartrate 50 MG tablet Commonly known as: LOPRESSOR Take 1 tablet (50 mg total) by mouth 2 (two) times daily.   nystatin 100000 UNIT/ML suspension Commonly known as: MYCOSTATIN Take 5 mLs (  500,000 Units total) by mouth 4 (four) times daily.   pravastatin 20 MG tablet Commonly known as: PRAVACHOL Take 1 tablet (20 mg total) by mouth daily.   sitaGLIPtin 100 MG tablet Commonly known as: Januvia Take 1 tablet (100 mg total) by mouth daily.   tiotropium 18 MCG inhalation capsule Commonly known as: Spiriva HandiHaler Place 1 capsule (18 mcg total) into inhaler and inhale daily.   VITAMIN B 12 PO Take 1,000 mg by mouth daily.         Objective:   BP 138/67   Pulse 78   Ht 6' (1.829 m)   Wt 164 lb (74.4 kg)   SpO2  98%   BMI 22.24 kg/m   Wt Readings from Last 3 Encounters:  01/27/23 164 lb (74.4 kg)  10/24/22 171 lb (77.6 kg)  07/21/22 175 lb (79.4 kg)    Physical Exam Vitals and nursing note reviewed.  Constitutional:      General: He is not in acute distress.    Appearance: He is well-developed. He is not diaphoretic.  Eyes:     General: No scleral icterus.    Conjunctiva/sclera: Conjunctivae normal.  Neck:     Thyroid: No thyromegaly.  Cardiovascular:     Rate and Rhythm: Normal rate and regular rhythm.     Heart sounds: Normal heart sounds. No murmur heard. Pulmonary:     Effort: Pulmonary effort is normal. No respiratory distress.     Breath sounds: Normal breath sounds. No wheezing.  Musculoskeletal:        General: Normal range of motion.     Cervical back: Neck supple.  Lymphadenopathy:     Cervical: No cervical adenopathy.  Skin:    General: Skin is warm and dry.     Findings: No rash.  Neurological:     Mental Status: He is alert and oriented to person, place, and time.     Coordination: Coordination normal.  Psychiatric:        Behavior: Behavior normal.       Assessment & Plan:   Problem List Items Addressed This Visit       Cardiovascular and Mediastinum   Hypertension associated with diabetes ([place])   Relevant Orders   CBC with Differential/Platelet   CMP14+EGFR   Lipid panel   Bayer DCA Hb A1c Waived     Endocrine   Hyperlipidemia associated with type 2 diabetes mellitus ([place]) - Primary   Relevant Orders   CBC with Differential/Platelet   CMP14+EGFR   Lipid panel   Bayer DCA Hb A1c Waived   Type 2 diabetes mellitus with other specified complication (HCC)   Relevant Orders   CBC with Differential/Platelet   CMP14+EGFR   Lipid panel   Bayer DCA Hb A1c Waived    A1c slightly up at 7.7.  Blood pressure looks good.  Will check blood work.  Patient does admit he is been eating a little more sweets so he will work on diet and does not want to add  medicine at this point.  We discussed that if we cannot get it down by next time we may have to discuss medicine Follow up plan: Return in about 3 months (around 04/27/2023), or if symptoms worsen or fail to improve, for Diabetes and hypertension cholesterol.  Counseling provided for all of the vaccine components Orders Placed This Encounter  Procedures   CBC with Differential/Platelet   CMP14+EGFR   Lipid panel   Bayer DCA Hb A1c Waived  Caryl Pina, MD [facility] 01/27/2023, 1:59 PM

## 2023-01-28 LAB — LIPID PANEL
Chol/HDL Ratio: 3.3 ratio (ref 0.0–5.0)
Cholesterol, Total: 147 mg/dL (ref 100–199)
HDL: 45 mg/dL (ref >39)
LDL Chol Calc (NIH): 87 mg/dL (ref 0–99)
Triglycerides: 79 mg/dL (ref 0–149)
VLDL Cholesterol Cal: 15 mg/dL (ref 5–40)

## 2023-01-28 LAB — CMP14+EGFR
ALT: 20 IU/L (ref 0–44)
AST: 18 IU/L (ref 0–40)
Albumin/Globulin Ratio: 1.4 (ref 1.2–2.2)
Albumin: 4.1 g/dL (ref 3.8–4.8)
Alkaline Phosphatase: 72 IU/L (ref 44–121)
BUN/Creatinine Ratio: 14 (ref 10–24)
BUN: 16 mg/dL (ref 8–27)
Bilirubin Total: 0.4 mg/dL (ref 0.0–1.2)
CO2: 23 mmol/L (ref 20–29)
Calcium: 9.1 mg/dL (ref 8.6–10.2)
Chloride: 92 mmol/L — ABNORMAL LOW (ref 96–106)
Creatinine, Ser: 1.18 mg/dL (ref 0.76–1.27)
Globulin, Total: 3 g/dL (ref 1.5–4.5)
Glucose: 61 mg/dL — ABNORMAL LOW (ref 70–99)
Potassium: 5 mmol/L (ref 3.5–5.2)
Sodium: 130 mmol/L — ABNORMAL LOW (ref 134–144)
Total Protein: 7.1 g/dL (ref 6.0–8.5)
eGFR: 64 mL/min/{1.73_m2} (ref >59)

## 2023-01-28 LAB — CBC WITH DIFFERENTIAL/PLATELET
Basophils Absolute: 0 10*3/uL (ref 0.0–0.2)
Basos: 0 %
EOS (ABSOLUTE): 0.2 10*3/uL (ref 0.0–0.4)
Eos: 2 %
Hematocrit: 31.5 % — ABNORMAL LOW (ref 37.5–51.0)
Hemoglobin: 9.9 g/dL — ABNORMAL LOW (ref 13.0–17.7)
Immature Grans (Abs): 0 10*3/uL (ref 0.0–0.1)
Immature Granulocytes: 0 %
Lymphocytes Absolute: 1.7 10*3/uL (ref 0.7–3.1)
Lymphs: 21 %
MCH: 26.4 pg — ABNORMAL LOW (ref 26.6–33.0)
MCHC: 31.4 g/dL — ABNORMAL LOW (ref 31.5–35.7)
MCV: 84 fL (ref 79–97)
Monocytes Absolute: 1.5 10*3/uL — ABNORMAL HIGH (ref 0.1–0.9)
Monocytes: 18 %
Neutrophils Absolute: 4.8 10*3/uL (ref 1.4–7.0)
Neutrophils: 59 %
Platelets: 303 10*3/uL (ref 150–450)
RBC: 3.75 x10E6/uL — ABNORMAL LOW (ref 4.14–5.80)
RDW: 17.5 % — ABNORMAL HIGH (ref 11.6–15.4)
WBC: 8.2 10*3/uL (ref 3.4–10.8)

## 2023-02-24 DIAGNOSIS — E119 Type 2 diabetes mellitus without complications: Secondary | ICD-10-CM | POA: Diagnosis not present

## 2023-02-24 DIAGNOSIS — H353122 Nonexudative age-related macular degeneration, left eye, intermediate dry stage: Secondary | ICD-10-CM | POA: Diagnosis not present

## 2023-02-24 DIAGNOSIS — Z961 Presence of intraocular lens: Secondary | ICD-10-CM | POA: Diagnosis not present

## 2023-02-24 DIAGNOSIS — H353111 Nonexudative age-related macular degeneration, right eye, early dry stage: Secondary | ICD-10-CM | POA: Diagnosis not present

## 2023-02-24 LAB — HM DIABETES EYE EXAM

## 2023-03-24 ENCOUNTER — Other Ambulatory Visit: Payer: Self-pay | Admitting: Family Medicine

## 2023-04-13 ENCOUNTER — Other Ambulatory Visit: Payer: Self-pay | Admitting: Family Medicine

## 2023-04-13 DIAGNOSIS — I152 Hypertension secondary to endocrine disorders: Secondary | ICD-10-CM

## 2023-05-04 ENCOUNTER — Encounter: Payer: Self-pay | Admitting: Family Medicine

## 2023-05-04 ENCOUNTER — Ambulatory Visit (INDEPENDENT_AMBULATORY_CARE_PROVIDER_SITE_OTHER): Payer: Medicare Other | Admitting: Family Medicine

## 2023-05-04 VITALS — BP 144/76 | HR 97 | Ht 72.0 in | Wt 172.0 lb

## 2023-05-04 DIAGNOSIS — J449 Chronic obstructive pulmonary disease, unspecified: Secondary | ICD-10-CM | POA: Diagnosis not present

## 2023-05-04 DIAGNOSIS — I152 Hypertension secondary to endocrine disorders: Secondary | ICD-10-CM | POA: Diagnosis not present

## 2023-05-04 DIAGNOSIS — E1159 Type 2 diabetes mellitus with other circulatory complications: Secondary | ICD-10-CM | POA: Diagnosis not present

## 2023-05-04 DIAGNOSIS — E785 Hyperlipidemia, unspecified: Secondary | ICD-10-CM

## 2023-05-04 DIAGNOSIS — E1169 Type 2 diabetes mellitus with other specified complication: Secondary | ICD-10-CM | POA: Diagnosis not present

## 2023-05-04 DIAGNOSIS — Z7984 Long term (current) use of oral hypoglycemic drugs: Secondary | ICD-10-CM | POA: Diagnosis not present

## 2023-05-04 DIAGNOSIS — J439 Emphysema, unspecified: Secondary | ICD-10-CM

## 2023-05-04 LAB — BAYER DCA HB A1C WAIVED: HB A1C (BAYER DCA - WAIVED): 6.5 % — ABNORMAL HIGH (ref 4.8–5.6)

## 2023-05-04 MED ORDER — ACCU-CHEK SOFTCLIX LANCETS MISC: 3 refills | Status: DC

## 2023-05-04 MED ORDER — LISINOPRIL 10 MG PO TABS: 10.0000 mg | ORAL_TABLET | Freq: Every day | ORAL | 3 refills | Status: DC

## 2023-05-04 MED ORDER — FUROSEMIDE 20 MG PO TABS: 20.0000 mg | ORAL_TABLET | Freq: Every day | ORAL | 1 refills | Status: DC

## 2023-05-04 MED ORDER — ACCU-CHEK AVIVA PLUS VI STRP: ORAL_STRIP | 2 refills | Status: DC

## 2023-05-04 NOTE — Progress Notes (Signed)
BP (!) 144/76   Pulse 97   Ht 6' (1.829 m)   Wt 172 lb (78 kg)   SpO2 95%   BMI 23.33 kg/m    Subjective:   Patient ID: Ray Carlson, male    DOB: 08-07-1946, 77 y.o.   MRN: [id number]  HPI: Ray Carlson is a 77 y.o. male presenting on 05/04/2023 for Medical Management of Chronic Issues, Hyperlipidemia, and Diabetes   HPI Type 2 diabetes mellitus Patient comes in today for recheck of his diabetes. Patient has been currently taking metformin and Januvia and glipizide. Patient is currently on an ACE inhibitor/ARB. Patient has seen an ophthalmologist this year. Patient denies any new issues with their feet. The symptom started onset as an adult hypertension and hyperlipidemia and COPD ARE RELATED TO DM   Hypertension Patient is currently on metoprolol and furosemide and lisinopril, and their blood pressure today is 144/76. Patient denies any lightheadedness or dizziness. Patient denies headaches, blurred vision, chest pains, shortness of breath, or weakness. Denies any side effects from medication and is content with current medication.   Hyperlipidemia Patient is coming in for recheck of his hyperlipidemia. The patient is currently taking pravastatin. They deny any issues with myalgias or history of liver damage from it. They deny any focal numbness or weakness or chest pain.   COPD Patient is coming in for COPD recheck today.  He is currently on Spiriva and albuterol and Breo.  He has a mild chronic cough but denies any major coughing spells or wheezing spells.  He has 0 nighttime symptoms per week and 0 daytime symptoms per week currently.   Relevant past medical, surgical, family and social history reviewed and updated as indicated. Interim medical history since our last visit reviewed. Allergies and medications reviewed and updated.  Review of Systems  Constitutional:  Negative for chills and fever.  HENT:  Positive for hearing loss.   Eyes:  Negative for visual disturbance.   Respiratory:  Negative for shortness of breath and wheezing.   Cardiovascular:  Negative for chest pain and leg swelling.  Musculoskeletal:  Negative for back pain and gait problem.  Skin:  Negative for rash.  Neurological:  Negative for dizziness, weakness, light-headedness and headaches.  All other systems reviewed and are negative.   Per HPI unless specifically indicated above   Allergies as of 05/04/2023       Reactions   Penicillins         Medication List        Accurate as of May 04, 2023  3:09 PM. If you have any questions, ask your nurse or doctor.          Accu-Chek Aviva Plus test strip Generic drug: glucose blood Test bid. E11.9   Accu-Chek Aviva Plus w/Device Kit 1 each by Does not apply route daily.   Accu-Chek Softclix Lancets lancets CHECK BLOOD GLUCOSE DAILY AS DIRECTED   albuterol 108 (90 Base) MCG/ACT inhaler Commonly known as: VENTOLIN HFA INHALE 2 PUFFS EVERY 6 HOURS AS NEEDED FOR WHEEZING OR SHORTNESS OF BREATH   Breo Ellipta 100-25 MCG/ACT Aepb Generic drug: fluticasone furoate-vilanterol Inhale 1 puff into the lungs daily.   diclofenac Sodium 1 % Gel Commonly known as: Voltaren Apply 2 g topically 4 (four) times daily.   furosemide 20 MG tablet Commonly known as: LASIX Take 1 tablet (20 mg total) by mouth daily.   glipiZIDE 5 MG tablet Commonly known as: GLUCOTROL Take 1 tablet (5 mg total)  by mouth 2 (two) times daily.   lisinopril 10 MG tablet Commonly known as: ZESTRIL Take 1 tablet (10 mg total) by mouth daily.   metFORMIN 500 MG tablet Commonly known as: GLUCOPHAGE Take 2 tablets (1,000 mg total) by mouth 2 (two) times daily.   metoprolol tartrate 50 MG tablet Commonly known as: LOPRESSOR Take 1 tablet (50 mg total) by mouth 2 (two) times daily.   nystatin 100000 UNIT/ML suspension Commonly known as: MYCOSTATIN Take 5 mLs (500,000 Units total) by mouth 4 (four) times daily.   pravastatin 20 MG tablet Commonly  known as: PRAVACHOL Take 1 tablet (20 mg total) by mouth daily.   sitaGLIPtin 100 MG tablet Commonly known as: Januvia Take 1 tablet (100 mg total) by mouth daily.   tiotropium 18 MCG inhalation capsule Commonly known as: Spiriva HandiHaler Place 1 capsule (18 mcg total) into inhaler and inhale daily.   VITAMIN B 12 PO Take 1,000 mg by mouth daily.         Objective:   BP (!) 144/76   Pulse 97   Ht 6' (1.829 m)   Wt 172 lb (78 kg)   SpO2 95%   BMI 23.33 kg/m   Wt Readings from Last 3 Encounters:  05/04/23 172 lb (78 kg)  01/27/23 164 lb (74.4 kg)  10/24/22 171 lb (77.6 kg)    Physical Exam Vitals and nursing note reviewed.  Constitutional:      General: He is not in acute distress.    Appearance: He is well-developed. He is not diaphoretic.  Eyes:     General: No scleral icterus.    Conjunctiva/sclera: Conjunctivae normal.  Neck:     Thyroid: No thyromegaly.  Cardiovascular:     Rate and Rhythm: Normal rate and regular rhythm.     Heart sounds: Normal heart sounds. No murmur heard. Pulmonary:     Effort: Pulmonary effort is normal. No respiratory distress.     Breath sounds: Normal breath sounds. No wheezing.  Musculoskeletal:     Cervical back: Neck supple.  Lymphadenopathy:     Cervical: No cervical adenopathy.  Skin:    General: Skin is warm and dry.     Findings: No rash.  Neurological:     Mental Status: He is alert and oriented to person, place, and time.     Coordination: Coordination normal.  Psychiatric:        Behavior: Behavior normal.     Results for orders placed or performed in visit on 03/03/23  HM DIABETES EYE EXAM  Result Value Ref Range   HM Diabetic Eye Exam No Retinopathy No Retinopathy    Assessment & Plan:   Problem List Items Addressed This Visit       Cardiovascular and Mediastinum   Hypertension associated with diabetes (HCC)   Relevant Medications   furosemide (LASIX) 20 MG tablet   lisinopril (ZESTRIL) 10 MG  tablet     Respiratory   COPD (chronic obstructive pulmonary disease) (HCC)     Endocrine   Hyperlipidemia associated with type 2 diabetes mellitus (HCC)   Relevant Medications   furosemide (LASIX) 20 MG tablet   lisinopril (ZESTRIL) 10 MG tablet   Type 2 diabetes mellitus with other specified complication (HCC) - Primary   Relevant Medications   glucose blood (ACCU-CHEK AVIVA PLUS) test strip   lisinopril (ZESTRIL) 10 MG tablet   Other Relevant Orders   Bayer DCA Hb A1c Waived   Microalbumin / creatinine urine ratio  A1c 6.5, looks good.  Blood pressure looks decent and he says it runs even better at home per his son, no changes. Follow up plan: Return in about 3 months (around 08/04/2023), or if symptoms worsen or fail to improve, for Diabetes and hypertension recheck.  Counseling provided for all of the vaccine components Orders Placed This Encounter  Procedures   Bayer DCA Hb A1c Waived   Microalbumin / creatinine urine ratio    Arville Care, MD [facility] 05/04/2023, 3:09 PM

## 2023-05-05 DIAGNOSIS — E1169 Type 2 diabetes mellitus with other specified complication: Secondary | ICD-10-CM | POA: Diagnosis not present

## 2023-05-06 LAB — MICROALBUMIN / CREATININE URINE RATIO
Creatinine, Urine: 61 mg/dL
Microalb/Creat Ratio: 7 mg/g creat (ref 0–29)
Microalbumin, Urine: 4.5 ug/mL

## 2023-07-11 ENCOUNTER — Other Ambulatory Visit: Payer: Self-pay | Admitting: Family Medicine

## 2023-08-09 ENCOUNTER — Encounter: Payer: Self-pay | Admitting: Family Medicine

## 2023-08-09 ENCOUNTER — Ambulatory Visit (INDEPENDENT_AMBULATORY_CARE_PROVIDER_SITE_OTHER): Payer: Medicare Other | Admitting: Family Medicine

## 2023-08-09 VITALS — BP 151/68 | HR 82 | Ht 72.0 in | Wt 173.0 lb

## 2023-08-09 DIAGNOSIS — J449 Chronic obstructive pulmonary disease, unspecified: Secondary | ICD-10-CM

## 2023-08-09 DIAGNOSIS — E1159 Type 2 diabetes mellitus with other circulatory complications: Secondary | ICD-10-CM | POA: Diagnosis not present

## 2023-08-09 DIAGNOSIS — Z7984 Long term (current) use of oral hypoglycemic drugs: Secondary | ICD-10-CM

## 2023-08-09 DIAGNOSIS — E785 Hyperlipidemia, unspecified: Secondary | ICD-10-CM | POA: Diagnosis not present

## 2023-08-09 DIAGNOSIS — I152 Hypertension secondary to endocrine disorders: Secondary | ICD-10-CM

## 2023-08-09 DIAGNOSIS — E1169 Type 2 diabetes mellitus with other specified complication: Secondary | ICD-10-CM

## 2023-08-09 LAB — BAYER DCA HB A1C WAIVED: HB A1C (BAYER DCA - WAIVED): 6.9 % — ABNORMAL HIGH (ref 4.8–5.6)

## 2023-08-09 NOTE — Progress Notes (Addendum)
BP (!) 151/68   Pulse 82   Ht 6' (1.829 m)   Wt 173 lb (78.5 kg)   SpO2 93%   BMI 23.46 kg/m    Subjective:   Patient ID: Ray Carlson, male    DOB: March 02, 1946, 77 y.o.   MRN: [id number]  HPI: Ray Carlson is a 77 y.o. male presenting on 08/09/2023 for Medical Management of Chronic Issues, Diabetes, Hypertension, and Hyperlipidemia   HPI Type 2 diabetes mellitus Patient comes in today for recheck of his diabetes. Patient has been currently taking glipizide and metformin and Januvia. Patient is currently on an ACE inhibitor/ARB. Patient has not seen an ophthalmologist this year. Patient denies any new issues with their feet. The symptom started onset as an adult hypertension and hyperlipidemia ARE RELATED TO DM.  He has been having occasional hypoglycemic episode, it happened 2 or 3 times in the past month.  He says he got down to 69 just this week.  He says it always been in the morning.  Hypertension Patient is currently on furosemide and lisinopril and metoprolol, and their blood pressure today is 151/68. Patient denies any lightheadedness or dizziness. Patient denies headaches, blurred vision, chest pains, shortness of breath, or weakness. Denies any side effects from medication and is content with current medication.   Hyperlipidemia Patient is coming in for recheck of his hyperlipidemia. The patient is currently taking pravastatin. They deny any issues with myalgias or history of liver damage from it. They deny any focal numbness or weakness or chest pain.   COPD Patient is coming in for COPD recheck today.  He is currently on Spiriva and Breo and albuterol.  He has a mild chronic cough but denies any major coughing spells or wheezing spells.  He has 2 nighttime symptoms per week and 5 daytime symptoms per week currently.  He does have some baseline congestion but symptoms any worse than his baseline at this point.  Relevant past medical, surgical, family and social history reviewed  and updated as indicated. Interim medical history since our last visit reviewed. Allergies and medications reviewed and updated.  Review of Systems  Constitutional:  Negative for chills and fever.  HENT:  Positive for congestion.   Eyes:  Negative for discharge.  Respiratory:  Positive for cough and wheezing. Negative for shortness of breath.   Cardiovascular:  Negative for chest pain and leg swelling.  Musculoskeletal:  Negative for back pain and gait problem.  Skin:  Negative for rash.  All other systems reviewed and are negative.   Per HPI unless specifically indicated above   Allergies as of 08/09/2023       Reactions   Penicillins         Medication List        Accurate as of August 09, 2023 11:59 PM. If you have any questions, ask your nurse or doctor.          Accu-Chek Aviva Plus test strip Generic drug: glucose blood Test bid. E11.9   Accu-Chek Aviva Plus w/Device Kit 1 each by Does not apply route daily.   Accu-Chek Softclix Lancets lancets CHECK BLOOD GLUCOSE DAILY AS DIRECTED   albuterol 108 (90 Base) MCG/ACT inhaler Commonly known as: VENTOLIN HFA INHALE 2 PUFFS EVERY 6 HOURS AS NEEDED FOR WHEEZING OR SHORTNESS OF BREATH   Breo Ellipta 100-25 MCG/ACT Aepb Generic drug: fluticasone furoate-vilanterol Inhale 1 puff into the lungs daily.   diclofenac Sodium 1 % Gel Commonly known as: Voltaren  Apply 2 g topically 4 (four) times daily.   furosemide 20 MG tablet Commonly known as: LASIX Take 1 tablet (20 mg total) by mouth daily.   glipiZIDE 5 MG tablet Commonly known as: GLUCOTROL Take 1 tablet (5 mg total) by mouth 2 (two) times daily.   lisinopril 10 MG tablet Commonly known as: ZESTRIL Take 1 tablet (10 mg total) by mouth daily.   metFORMIN 500 MG tablet Commonly known as: GLUCOPHAGE Take 2 tablets (1,000 mg total) by mouth 2 (two) times daily.   metoprolol tartrate 50 MG tablet Commonly known as: LOPRESSOR Take 1 tablet (50 mg  total) by mouth 2 (two) times daily.   nystatin 100000 UNIT/ML suspension Commonly known as: MYCOSTATIN Take 5 mLs (500,000 Units total) by mouth 4 (four) times daily.   pravastatin 20 MG tablet Commonly known as: PRAVACHOL Take 1 tablet (20 mg total) by mouth daily.   sitaGLIPtin 100 MG tablet Commonly known as: Januvia Take 1 tablet (100 mg total) by mouth daily.   tiotropium 18 MCG inhalation capsule Commonly known as: Spiriva HandiHaler Place 1 capsule (18 mcg total) into inhaler and inhale daily.   VITAMIN B 12 PO Take 1,000 mg by mouth daily.         Objective:   BP (!) 151/68   Pulse 82   Ht 6' (1.829 m)   Wt 173 lb (78.5 kg)   SpO2 93%   BMI 23.46 kg/m   Wt Readings from Last 3 Encounters:  08/09/23 173 lb (78.5 kg)  05/04/23 172 lb (78 kg)  01/27/23 164 lb (74.4 kg)    Physical Exam Vitals and nursing note reviewed.  Constitutional:      General: He is not in acute distress.    Appearance: He is well-developed. He is not diaphoretic.  Eyes:     General: No scleral icterus.    Conjunctiva/sclera: Conjunctivae normal.  Neck:     Thyroid: No thyromegaly.  Cardiovascular:     Rate and Rhythm: Normal rate and regular rhythm.     Heart sounds: Normal heart sounds. No murmur heard. Pulmonary:     Effort: Pulmonary effort is normal. No respiratory distress.     Breath sounds: Normal breath sounds. No wheezing.  Musculoskeletal:        General: Swelling (2+ peripheral edema) present. Normal range of motion.     Cervical back: Neck supple.  Lymphadenopathy:     Cervical: No cervical adenopathy.  Neurological:     Mental Status: He is alert and oriented to person, place, and time.     Coordination: Coordination normal.  Psychiatric:        Behavior: Behavior normal.       Assessment & Plan:   Problem List Items Addressed This Visit       Cardiovascular and Mediastinum   Hypertension associated with diabetes (HCC)   Relevant Orders   CBC  with Differential/Platelet (Completed)   CMP14+EGFR (Completed)   Lipid panel (Completed)   Bayer DCA Hb A1c Waived (Completed)     Endocrine   Hyperlipidemia associated with type 2 diabetes mellitus (HCC)   Relevant Orders   CBC with Differential/Platelet (Completed)   CMP14+EGFR (Completed)   Lipid panel (Completed)   Bayer DCA Hb A1c Waived (Completed)   Type 2 diabetes mellitus with other specified complication (HCC) - Primary   Relevant Orders   CBC with Differential/Platelet (Completed)   CMP14+EGFR (Completed)   Lipid panel (Completed)   Bayer DCA Hb A1c  Waived (Completed)  Will reduce glipizide to only take it in the morning and not in the evening to hopefully get rid of hypoglycemic episodes the next morning.  A1c looks good at 6.9.  Blood pressure elevated but he has been walking around and chasing the grandson a lot before he came so he is going to keep a close eye on it over the next 2 weeks.  Follow up plan: Return in about 3 months (around 11/09/2023), or if symptoms worsen or fail to improve, for diabetes and htn and hld.  Counseling provided for all of the vaccine components Orders Placed This Encounter  Procedures   CBC with Differential/Platelet   CMP14+EGFR   Lipid panel   Bayer DCA Hb A1c Waived    Arville Care, MD [facility] 08/24/2023, 12:07 PM

## 2023-08-09 NOTE — Patient Instructions (Addendum)
Change the glipizide to only take in the morning to get rid of hypoglycemic episodes. Will reduce glipizide to only take it in the morning and not in the evening to hopefully get rid of hypoglycemic episodes the next morning.  A1c looks good at 6.9.  Blood pressure elevated but he has been walking around and chasing the grandson a lot before he came so he is going to keep a close eye on it over the next 2 weeks.

## 2023-08-10 LAB — CMP14+EGFR
ALT: 23 IU/L (ref 0–44)
AST: 18 IU/L (ref 0–40)
Albumin: 4.1 g/dL (ref 3.8–4.8)
Alkaline Phosphatase: 63 IU/L (ref 44–121)
BUN/Creatinine Ratio: 20 (ref 10–24)
BUN: 22 mg/dL (ref 8–27)
Bilirubin Total: 0.3 mg/dL (ref 0.0–1.2)
CO2: 23 mmol/L (ref 20–29)
Calcium: 9.1 mg/dL (ref 8.6–10.2)
Chloride: 95 mmol/L — ABNORMAL LOW (ref 96–106)
Creatinine, Ser: 1.08 mg/dL (ref 0.76–1.27)
Globulin, Total: 2.5 g/dL (ref 1.5–4.5)
Glucose: 97 mg/dL (ref 70–99)
Potassium: 4.9 mmol/L (ref 3.5–5.2)
Sodium: 135 mmol/L (ref 134–144)
Total Protein: 6.6 g/dL (ref 6.0–8.5)
eGFR: 70 mL/min/{1.73_m2} (ref >59)

## 2023-08-10 LAB — CBC WITH DIFFERENTIAL/PLATELET
Basophils Absolute: 0 10*3/uL (ref 0.0–0.2)
Basos: 0 %
EOS (ABSOLUTE): 0.2 10*3/uL (ref 0.0–0.4)
Eos: 4 %
Hematocrit: 32.4 % — ABNORMAL LOW (ref 37.5–51.0)
Hemoglobin: 10.2 g/dL — ABNORMAL LOW (ref 13.0–17.7)
Immature Grans (Abs): 0 10*3/uL (ref 0.0–0.1)
Immature Granulocytes: 1 %
Lymphocytes Absolute: 1.4 10*3/uL (ref 0.7–3.1)
Lymphs: 22 %
MCH: 27.4 pg (ref 26.6–33.0)
MCHC: 31.5 g/dL (ref 31.5–35.7)
MCV: 87 fL (ref 79–97)
Monocytes Absolute: 0.8 10*3/uL (ref 0.1–0.9)
Monocytes: 13 %
Neutrophils Absolute: 3.8 10*3/uL (ref 1.4–7.0)
Neutrophils: 60 %
Platelets: 240 10*3/uL (ref 150–450)
RBC: 3.72 x10E6/uL — ABNORMAL LOW (ref 4.14–5.80)
RDW: 15.8 % — ABNORMAL HIGH (ref 11.6–15.4)
WBC: 6.2 10*3/uL (ref 3.4–10.8)

## 2023-08-10 LAB — LIPID PANEL
Chol/HDL Ratio: 2.7 ratio (ref 0.0–5.0)
Cholesterol, Total: 123 mg/dL (ref 100–199)
HDL: 46 mg/dL (ref >39)
LDL Chol Calc (NIH): 60 mg/dL (ref 0–99)
Triglycerides: 86 mg/dL (ref 0–149)
VLDL Cholesterol Cal: 17 mg/dL (ref 5–40)

## 2023-08-29 ENCOUNTER — Ambulatory Visit (INDEPENDENT_AMBULATORY_CARE_PROVIDER_SITE_OTHER): Payer: Medicare Other

## 2023-08-29 VITALS — Ht 72.0 in | Wt 174.0 lb

## 2023-08-29 DIAGNOSIS — Z0000 Encounter for general adult medical examination without abnormal findings: Secondary | ICD-10-CM | POA: Diagnosis not present

## 2023-08-29 NOTE — Progress Notes (Signed)
Subjective:   Ray Carlson is a 77 y.o. male who presents for Medicare Annual/Subsequent preventive examination.  Visit Complete: Virtual  I connected with  Ray Carlson on 08/29/23 by a audio enabled telemedicine application and verified that I am speaking with the correct person using two identifiers.  Patient Location: Home  Provider Location: Home Office  I discussed the limitations of evaluation and management by telemedicine. The patient expressed understanding and agreed to proceed.  Patient Medicare AWV questionnaire was completed by the patient on 08/29/2023; I have confirmed that all information answered by patient is correct and no changes since this date.  Cardiac Risk Factors include: advanced age (>58men, >86 women);diabetes mellitus;dyslipidemia;hypertension;male genderVital Signs: Unable to obtain new vitals due to this being a telehealth visit.      Objective:    Today's Vitals   08/29/23 1433  Weight: 174 lb (78.9 kg)  Height: 6' (1.829 m)   Body mass index is 23.6 kg/m.     08/29/2023    2:39 PM 08/01/2022    2:45 PM  Advanced Directives  Does Patient Have a Medical Advance Directive? No Yes  Type of Special educational needs teacher of [place];Living will  Does patient want to make changes to medical advance directive?  No - Guardian declined  Copy of Healthcare Power of Attorney in Chart?  No - copy requested  Would patient like information on creating a medical advance directive? Yes (MAU/Ambulatory/Procedural Areas - Information given)     Current Medications (verified) Outpatient Encounter Medications as of 08/29/2023  Medication Sig   Accu-Chek Softclix Lancets lancets CHECK BLOOD GLUCOSE DAILY AS DIRECTED   albuterol (VENTOLIN HFA) 108 (90 Base) MCG/ACT inhaler INHALE 2 PUFFS EVERY 6 HOURS AS NEEDED FOR WHEEZING OR SHORTNESS OF BREATH   Blood Glucose Monitoring Suppl (ACCU-CHEK AVIVA PLUS) w/Device KIT 1 each by Does not apply route daily.    BREO ELLIPTA 100-25 MCG/ACT AEPB Inhale 1 puff into the lungs daily.   Cyanocobalamin (VITAMIN B 12 PO) Take 1,000 mg by mouth daily.   diclofenac Sodium (VOLTAREN) 1 % GEL Apply 2 g topically 4 (four) times daily.   furosemide (LASIX) 20 MG tablet Take 1 tablet (20 mg total) by mouth daily.   glipiZIDE (GLUCOTROL) 5 MG tablet Take 1 tablet (5 mg total) by mouth 2 (two) times daily.   glucose blood (ACCU-CHEK AVIVA PLUS) test strip Test bid. E11.9   lisinopril (ZESTRIL) 10 MG tablet Take 1 tablet (10 mg total) by mouth daily.   metFORMIN (GLUCOPHAGE) 500 MG tablet Take 2 tablets (1,000 mg total) by mouth 2 (two) times daily.   metoprolol tartrate (LOPRESSOR) 50 MG tablet Take 1 tablet (50 mg total) by mouth 2 (two) times daily.   nystatin (MYCOSTATIN) 100000 UNIT/ML suspension Take 5 mLs (500,000 Units total) by mouth 4 (four) times daily.   pravastatin (PRAVACHOL) 20 MG tablet Take 1 tablet (20 mg total) by mouth daily.   sitaGLIPtin (JANUVIA) 100 MG tablet Take 1 tablet (100 mg total) by mouth daily.   tiotropium (SPIRIVA HANDIHALER) 18 MCG inhalation capsule Place 1 capsule (18 mcg total) into inhaler and inhale daily.   No facility-administered encounter medications on file as of 08/29/2023.    Allergies (verified) Penicillins   History: Past Medical History:  Diagnosis Date   Diabetes mellitus without complication (HCC)    Hyperlipidemia    Hypertension    Tobacco user    Vitamin D deficiency    Past Surgical  History:  Procedure Laterality Date   CATARACT EXTRACTION     Family History  Problem Relation Age of Onset   Hypertension Mother    Heart disease Mother    Diabetes Mother    Heart failure Mother    Hypertension Father    Leukemia Father    Diabetes Sister    Diabetes Brother    Healthy Son    Diabetes Brother    Diabetes Brother    Diabetes Sister    Healthy Son    Social History   Socioeconomic History   Marital status: Divorced    Spouse name: Not on  file   Number of children: 2   Years of education: 12   Highest education level: 12th grade  Occupational History   Occupation: Retired    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  Tobacco Use   Smoking status: Former    Current packs/day: 0.00    Types: Cigarettes    Quit date: 01/01/2016    Years since quitting: 7.6    Passive exposure: Past   Smokeless tobacco: Never   Tobacco comments:    Verified by son, [name]  Vaping Use   Vaping status: Never Used  Substance and Sexual Activity   Alcohol use: No   Drug use: No   Sexual activity: Not Currently    Partners: Female  Other Topics Concern   Not on file  Social History Narrative   Not on file   Social Determinants of Health   Financial Resource Strain: Low Risk  (08/29/2023)   Overall Financial Resource Strain (CARDIA)    Difficulty of Paying Living Expenses: Not hard at all  Food Insecurity: No Food Insecurity (08/29/2023)   Hunger Vital Sign    Worried About Running Out of Food in the Last Year: Never true    Ran Out of Food in the Last Year: Never true  Transportation Needs: No Transportation Needs (08/29/2023)   PRAPARE - Administrator, Civil Service (Medical): No    Lack of Transportation (Non-Medical): No  Physical Activity: Inactive (08/29/2023)   Exercise Vital Sign    Days of Exercise per Week: 0 days    Minutes of Exercise per Session: 0 min  Stress: No Stress Concern Present (08/29/2023)   [employer] of Occupational Health - Occupational Stress Questionnaire    Feeling of Stress : Not at all  Social Connections: Moderately Isolated (08/29/2023)   Social Connection and Isolation Panel [NHANES]    Frequency of Communication with Friends and Family: More than three times a week    Frequency of Social Gatherings with Friends and Family: More than three times a week    Attends Religious Services: Never    Database administrator or Organizations: No    Attends Banker  Meetings: 1 to 4 times per year    Marital Status: Widowed    Tobacco Counseling Counseling given: Not Answered Tobacco comments: Verified by son, [name]   Clinical Intake:  Pre-visit preparation completed: Yes  Pain : No/denies pain     Nutritional Risks: None Diabetes: Yes CBG done?: No Did pt. bring in CBG monitor from home?: No  How often do you need to have someone help you when you read instructions, pamphlets, or other written materials from your doctor or pharmacy?: 1 - Never  Interpreter Needed?: No  Information entered by :: Renie Ora, LPN   Activities of Daily Living  08/29/2023    2:40 PM  In your present state of health, do you have any difficulty performing the following activities:  Hearing? 0  Vision? 0  Difficulty concentrating or making decisions? 0  Walking or climbing stairs? 0  Dressing or bathing? 0  Doing errands, shopping? 0  Preparing Food and eating ? N  Using the Toilet? N  In the past six months, have you accidently leaked urine? N  Do you have problems with loss of bowel control? N  Managing your Medications? N  Managing your Finances? N  Housekeeping or managing your Housekeeping? N    Patient Care Team: Dettinger, Elige Radon, MD as PCP - General (Family Medicine) Love, Genene Churn, MD as Consulting Physician (Neurology) Bakhru, Iverson Alamin, MD as Referring Physician (Pulmonary Disease) Michaelle Copas, MD as Referring Physician (Optometry)  Indicate any recent Medical Services you may have received from other than [facility] providers in the past year (date may be approximate).     Assessment:   This is a routine wellness examination for Izreal.  Hearing/Vision screen Vision Screening - Comments:: Wears rx glasses - up to date with routine eye exams with  [facility]    Goals Addressed             This Visit's Progress    DIET - INCREASE WATER INTAKE         Depression Screen    08/29/2023    2:37 PM 08/09/2023    3:24 PM  01/27/2023    1:09 PM 10/24/2022    2:17 PM 08/01/2022    2:40 PM 07/21/2022    2:05 PM 04/18/2022    3:21 PM  PHQ 2/9 Scores  PHQ - 2 Score 0 0 0 0 0  0  PHQ- 9 Score 0 0 0    0  Exception Documentation     Other- indicate reason in comment box Patient refusal   Not completed     Verified by son, Elean Mittelman      Fall Risk    08/29/2023    2:34 PM 08/09/2023    3:24 PM 01/27/2023    1:09 PM 10/24/2022    2:17 PM 08/01/2022    2:43 PM  Fall Risk   Falls in the past year? 0 0 0 0 0  Number falls in past yr: 0    0  Injury with Fall? 0    0  Risk for fall due to : No Fall Risks    No Fall Risks  Follow up Falls prevention discussed    Falls evaluation completed;Education provided;Falls prevention discussed    MEDICARE RISK AT HOME: Medicare Risk at Home Any stairs in or around the home?: Yes If so, are there any without handrails?: No Home free of loose throw rugs in walkways, pet beds, electrical cords, etc?: Yes Adequate lighting in your home to reduce risk of falls?: Yes Life alert?: No Use of a cane, walker or w/c?: No Grab bars in the bathroom?: Yes Shower chair or bench in shower?: Yes Elevated toilet seat or a handicapped toilet?: Yes  TIMED UP AND GO:  Was the test performed?  No    Cognitive Function:    10/24/2017    3:19 PM  MMSE - Mini Mental State Exam  Orientation to time 5  Orientation to Place 5  Registration 3  Attention/ Calculation 0  Attention/Calculation-comments not attempted  Recall 2  Language- name 2 objects 2  Language- repeat 1  Language- follow 3 step command 3  Language- read & follow direction 1  Write a sentence 0  Write a sentence-comments not attempted  Copy design 0  Total score 22        08/29/2023    2:40 PM  6CIT Screen  What Year? 0 points  What month? 0 points  What time? 0 points  Count back from 20 0 points  Months in reverse 0 points  Repeat phrase 0 points  Total Score 0 points    Immunizations Immunization  History  Administered Date(s) Administered   Fluad Quad(high Dose 65+) 09/30/2021, 10/24/2022   Influenza, High Dose Seasonal PF 09/30/2015, 09/06/2017, 09/05/2018   Influenza, Quadrivalent, Recombinant, Inj, Pf 09/16/2019   Influenza,inj,Quad PF,6+ Mos 10/14/2014, 09/20/2016   Influenza-Unspecified 10/12/2013   Moderna SARS-COV2 Booster Vaccination 11/15/2020   Moderna Sars-Covid-2 Vaccination 02/19/2020, 03/18/2020   Pneumococcal Conjugate-13 09/01/2015   Pneumococcal Polysaccharide-23 08/12/2010, 12/03/2013   Tdap 12/13/2007, 03/02/2018, 03/02/2018   Zoster Recombinant(Shingrix) 06/30/2021   Zoster, Live 08/04/2014    TDAP status: Up to date  Flu Vaccine status: Up to date  Pneumococcal vaccine status: Up to date  Covid-19 vaccine status: Completed vaccines  Qualifies for Shingles Vaccine? Yes   Zostavax completed Yes   Shingrix Completed?: Yes  Screening Tests Health Maintenance  Topic Date Due   INFLUENZA VACCINE  07/13/2023   COVID-19 Vaccine (4 - 2023-24 season) 08/13/2023   FOOT EXAM  10/25/2023   HEMOGLOBIN A1C  02/09/2024   OPHTHALMOLOGY EXAM  02/24/2024   Diabetic kidney evaluation - Urine ACR  05/04/2024   Diabetic kidney evaluation - eGFR measurement  08/08/2024   Medicare Annual Wellness (AWV)  08/28/2024   DTaP/Tdap/Td (4 - Td or Tdap) 03/02/2028   Pneumonia Vaccine 28+ Years old  Completed   Hepatitis C Screening  Completed   HPV VACCINES  Aged Out   Colonoscopy  Discontinued   Zoster Vaccines- Shingrix  Discontinued    Health Maintenance  Health Maintenance Due  Topic Date Due   INFLUENZA VACCINE  07/13/2023   COVID-19 Vaccine (4 - 2023-24 season) 08/13/2023    Colorectal cancer screening: No longer required.   Lung Cancer Screening: (Low Dose CT Chest recommended if Age 85-80 years, 20 pack-year currently smoking OR have quit w/in 15years.) does not qualify.   Lung Cancer Screening Referral: n/a  Additional Screening:  Hepatitis C  Screening: does not qualify; Completed 05/29/2017  Vision Screening: Recommended annual ophthalmology exams for early detection of glaucoma and other disorders of the eye. Is the patient up to date with their annual eye exam?  Yes  Who is the provider or what is the name of the office in which the patient attends annual eye exams? [facility]  If pt is not established with a provider, would they like to be referred to a provider to establish care? No .   Dental Screening: Recommended annual dental exams for proper oral hygiene   Community Resource Referral / Chronic Care Management: CRR required this visit?  No   CCM required this visit?  No     Plan:     I have personally reviewed and noted the following in the patient's chart:   Medical and social history Use of alcohol, tobacco or illicit drugs  Current medications and supplements including opioid prescriptions. Patient is not currently taking opioid prescriptions. Functional ability and status Nutritional status Physical activity Advanced directives List of other physicians Hospitalizations, surgeries, and ER visits in previous 12 months Vitals  Screenings to include cognitive, depression, and falls Referrals and appointments  In addition, I have reviewed and discussed with patient certain preventive protocols, quality metrics, and best practice recommendations. A written personalized care plan for preventive services as well as general preventive health recommendations were provided to patient.     Lorrene Reid, LPN   07/06/3663   After Visit Summary: (MyChart) Due to this being a telephonic visit, the after visit summary with patients personalized plan was offered to patient via MyChart   Nurse Notes: none

## 2023-08-29 NOTE — Patient Instructions (Signed)
Mr. Fazzino , Thank you for taking time to come for your Medicare Wellness Visit. I appreciate your ongoing commitment to your health goals. Please review the following plan we discussed and let me know if I can assist you in the future.   Referrals/Orders/Follow-Ups/Clinician Recommendations: Aim for 30 minutes of exercise or brisk walking, 6-8 glasses of water, and 5 servings of fruits and vegetables each day.   This is a list of the screening recommended for you and due dates:  Health Maintenance  Topic Date Due   Flu Shot  07/13/2023   COVID-19 Vaccine (4 - 2023-24 season) 08/13/2023   Complete foot exam   10/25/2023   Hemoglobin A1C  02/09/2024   Eye exam for diabetics  02/24/2024   Yearly kidney health urinalysis for diabetes  05/04/2024   Yearly kidney function blood test for diabetes  08/08/2024   Medicare Annual Wellness Visit  08/28/2024   DTaP/Tdap/Td vaccine (4 - Td or Tdap) 03/02/2028   Pneumonia Vaccine  Completed   Hepatitis C Screening  Completed   HPV Vaccine  Aged Out   Colon Cancer Screening  Discontinued   Zoster (Shingles) Vaccine  Discontinued    Advanced directives: (Provided) Advance directive discussed with you today. I have provided a copy for you to complete at home and have notarized. Once this is complete, please bring a copy in to our office so we can scan it into your chart. Information on Advanced Care Planning can be found at [facility] Advance Health Care Directives Advance Health Care Directives ([email or web address]    Next Medicare Annual Wellness Visit scheduled for next year: Yes  insert Preventive Care attachment Insert FALL PREVENTION attachment if needed

## 2023-09-15 ENCOUNTER — Other Ambulatory Visit: Payer: Self-pay | Admitting: Family Medicine

## 2023-09-15 DIAGNOSIS — J439 Emphysema, unspecified: Secondary | ICD-10-CM

## 2023-10-09 ENCOUNTER — Other Ambulatory Visit: Payer: Self-pay | Admitting: Family Medicine

## 2023-10-09 DIAGNOSIS — E1159 Type 2 diabetes mellitus with other circulatory complications: Secondary | ICD-10-CM

## 2023-10-09 DIAGNOSIS — E1169 Type 2 diabetes mellitus with other specified complication: Secondary | ICD-10-CM

## 2023-10-10 DIAGNOSIS — L97511 Non-pressure chronic ulcer of other part of right foot limited to breakdown of skin: Secondary | ICD-10-CM | POA: Diagnosis not present

## 2023-10-17 DIAGNOSIS — L97511 Non-pressure chronic ulcer of other part of right foot limited to breakdown of skin: Secondary | ICD-10-CM | POA: Diagnosis not present

## 2023-11-07 DIAGNOSIS — L02411 Cutaneous abscess of right axilla: Secondary | ICD-10-CM | POA: Diagnosis not present

## 2023-11-07 DIAGNOSIS — M79674 Pain in right toe(s): Secondary | ICD-10-CM | POA: Diagnosis not present

## 2023-11-07 DIAGNOSIS — L97511 Non-pressure chronic ulcer of other part of right foot limited to breakdown of skin: Secondary | ICD-10-CM | POA: Diagnosis not present

## 2023-11-13 ENCOUNTER — Encounter: Payer: Self-pay | Admitting: Family Medicine

## 2023-11-13 ENCOUNTER — Ambulatory Visit (INDEPENDENT_AMBULATORY_CARE_PROVIDER_SITE_OTHER): Payer: Medicare Other | Admitting: Family Medicine

## 2023-11-13 VITALS — BP 141/69 | HR 80 | Ht 72.0 in | Wt 179.0 lb

## 2023-11-13 DIAGNOSIS — E1159 Type 2 diabetes mellitus with other circulatory complications: Secondary | ICD-10-CM

## 2023-11-13 DIAGNOSIS — E785 Hyperlipidemia, unspecified: Secondary | ICD-10-CM | POA: Diagnosis not present

## 2023-11-13 DIAGNOSIS — I152 Hypertension secondary to endocrine disorders: Secondary | ICD-10-CM | POA: Diagnosis not present

## 2023-11-13 DIAGNOSIS — E11621 Type 2 diabetes mellitus with foot ulcer: Secondary | ICD-10-CM

## 2023-11-13 DIAGNOSIS — L97512 Non-pressure chronic ulcer of other part of right foot with fat layer exposed: Secondary | ICD-10-CM | POA: Diagnosis not present

## 2023-11-13 DIAGNOSIS — E1169 Type 2 diabetes mellitus with other specified complication: Secondary | ICD-10-CM

## 2023-11-13 DIAGNOSIS — Z7984 Long term (current) use of oral hypoglycemic drugs: Secondary | ICD-10-CM

## 2023-11-13 DIAGNOSIS — J449 Chronic obstructive pulmonary disease, unspecified: Secondary | ICD-10-CM

## 2023-11-13 DIAGNOSIS — J439 Emphysema, unspecified: Secondary | ICD-10-CM

## 2023-11-13 LAB — BAYER DCA HB A1C WAIVED: HB A1C (BAYER DCA - WAIVED): 6.3 % — ABNORMAL HIGH (ref 4.8–5.6)

## 2023-11-13 MED ORDER — SITAGLIPTIN PHOSPHATE 100 MG PO TABS: 100.0000 mg | ORAL_TABLET | Freq: Every day | ORAL | 3 refills | Status: DC

## 2023-11-13 MED ORDER — GLIPIZIDE 5 MG PO TABS: 5.0000 mg | ORAL_TABLET | Freq: Two times a day (BID) | ORAL | 3 refills | Status: DC

## 2023-11-13 MED ORDER — METFORMIN HCL 500 MG PO TABS: 1000.0000 mg | ORAL_TABLET | Freq: Two times a day (BID) | ORAL | 3 refills | Status: DC

## 2023-11-13 MED ORDER — METOPROLOL TARTRATE 50 MG PO TABS: 50.0000 mg | ORAL_TABLET | Freq: Two times a day (BID) | ORAL | 3 refills | Status: DC

## 2023-11-13 MED ORDER — BREO ELLIPTA 100-25 MCG/ACT IN AEPB: 1.0000 | INHALATION_SPRAY | Freq: Every day | RESPIRATORY_TRACT | 3 refills | Status: DC

## 2023-11-13 MED ORDER — PRAVASTATIN SODIUM 20 MG PO TABS: 20.0000 mg | ORAL_TABLET | Freq: Every day | ORAL | 3 refills | Status: DC

## 2023-11-13 NOTE — Progress Notes (Signed)
BP (!) 141/69   Pulse 80   Ht 6' (1.829 m)   Wt 179 lb (81.2 kg)   SpO2 93%   BMI 24.28 kg/m    Subjective:   Patient ID: Ray Carlson, male    DOB: 01-16-1946, 77 y.o.   MRN: [id number]  HPI: Ray Carlson is a 77 y.o. male presenting on 11/13/2023 for Medical Management of Chronic Issues and Diabetes   HPI Type 2 diabetes mellitus Patient comes in today for recheck of his diabetes. Patient has been currently taking glipizide and metformin and Januvia. Patient is currently on an ACE inhibitor/ARB. Patient has not seen an ophthalmologist this year. Patient denies any new issues with their feet. The symptom started onset as an adult hyperlipidemia and hypertension ARE RELATED TO DM   Hypertension Patient is currently on lisinopril and furosemide and metoprolol, and their blood pressure today is 141/69. Patient denies any lightheadedness or dizziness. Patient denies headaches, blurred vision, chest pains, shortness of breath, or weakness. Denies any side effects from medication and is content with current medication.   Hyperlipidemia Patient is coming in for recheck of his hyperlipidemia. The patient is currently taking pravastatin. They deny any issues with myalgias or history of liver damage from it. They deny any focal numbness or weakness or chest pain.   COPD Patient is coming in for COPD recheck today.  He is currently on albuterol and Spiriva and Breo.  He has a mild chronic cough but denies any major coughing spells or wheezing spells.  He has 3 nighttime symptoms per week and 3 daytime symptoms per week currently.  He says he is feeling about like he normally does.  Relevant past medical, surgical, family and social history reviewed and updated as indicated. Interim medical history since our last visit reviewed. Allergies and medications reviewed and updated.  Review of Systems  Constitutional:  Negative for chills and fever.  HENT:  Positive for congestion. Negative for ear  discharge, ear pain, postnasal drip, rhinorrhea, sinus pressure, sneezing, sore throat and voice change.   Eyes:  Negative for pain, discharge, redness and visual disturbance.  Respiratory:  Positive for cough. Negative for shortness of breath and wheezing.   Cardiovascular:  Negative for chest pain and leg swelling.  Musculoskeletal:  Negative for gait problem.  Skin:  Negative for rash.  All other systems reviewed and are negative.   Per HPI unless specifically indicated above   Allergies as of 11/13/2023       Reactions   Penicillins         Medication List        Accurate as of November 13, 2023  3:51 PM. If you have any questions, ask your nurse or doctor.          Accu-Chek Aviva Plus test strip Generic drug: glucose blood Test bid. E11.9   Accu-Chek Aviva Plus w/Device Kit 1 each by Does not apply route daily.   Accu-Chek Softclix Lancets lancets CHECK BLOOD GLUCOSE DAILY AS DIRECTED   albuterol 108 (90 Base) MCG/ACT inhaler Commonly known as: VENTOLIN HFA INHALE 2 PUFFS EVERY 6 HOURS AS NEEDED FOR WHEEZING OR SHORTNESS OF BREATH   Breo Ellipta 100-25 MCG/ACT Aepb Generic drug: fluticasone furoate-vilanterol Inhale 1 puff into the lungs daily. What changed: See the new instructions. Changed by: Elige Radon Rebekah Zackery   diclofenac Sodium 1 % Gel Commonly known as: Voltaren Apply 2 g topically 4 (four) times daily.   furosemide 20 MG tablet  Commonly known as: LASIX Take 1 tablet (20 mg total) by mouth daily.   glipiZIDE 5 MG tablet Commonly known as: GLUCOTROL Take 1 tablet (5 mg total) by mouth 2 (two) times daily.   lisinopril 10 MG tablet Commonly known as: ZESTRIL Take 1 tablet (10 mg total) by mouth daily.   metFORMIN 500 MG tablet Commonly known as: GLUCOPHAGE Take 2 tablets (1,000 mg total) by mouth 2 (two) times daily with a meal. What changed: See the new instructions. Changed by: Elige Radon Willie Loy   metoprolol tartrate 50 MG  tablet Commonly known as: LOPRESSOR Take 1 tablet (50 mg total) by mouth 2 (two) times daily.   nystatin 100000 UNIT/ML suspension Commonly known as: MYCOSTATIN Take 5 mLs (500,000 Units total) by mouth 4 (four) times daily.   pravastatin 20 MG tablet Commonly known as: PRAVACHOL Take 1 tablet (20 mg total) by mouth daily.   sitaGLIPtin 100 MG tablet Commonly known as: Januvia Take 1 tablet (100 mg total) by mouth daily.   tiotropium 18 MCG inhalation capsule Commonly known as: Spiriva HandiHaler Place 1 capsule (18 mcg total) into inhaler and inhale daily.   VITAMIN B 12 PO Take 1,000 mg by mouth daily.         Objective:   BP (!) 141/69   Pulse 80   Ht 6' (1.829 m)   Wt 179 lb (81.2 kg)   SpO2 93%   BMI 24.28 kg/m   Wt Readings from Last 3 Encounters:  11/13/23 179 lb (81.2 kg)  08/29/23 174 lb (78.9 kg)  08/09/23 173 lb (78.5 kg)    Physical Exam Vitals and nursing note reviewed.  Constitutional:      General: He is not in acute distress.    Appearance: He is well-developed. He is not diaphoretic.  HENT:     Right Ear: Tympanic membrane, ear canal and external ear normal.     Left Ear: Tympanic membrane, ear canal and external ear normal.     Nose: Mucosal edema present. No rhinorrhea.     Right Sinus: Maxillary sinus tenderness present. No frontal sinus tenderness.     Left Sinus: Maxillary sinus tenderness present. No frontal sinus tenderness.     Mouth/Throat:     Pharynx: Uvula midline. No oropharyngeal exudate or posterior oropharyngeal erythema.     Tonsils: No tonsillar abscesses.  Eyes:     General: No scleral icterus.       Right eye: No discharge.     Conjunctiva/sclera: Conjunctivae normal.     Pupils: Pupils are equal, round, and reactive to light.  Neck:     Thyroid: No thyromegaly.  Cardiovascular:     Rate and Rhythm: Normal rate and regular rhythm.     Heart sounds: Normal heart sounds. No murmur heard. Pulmonary:     Effort:  Pulmonary effort is normal. No respiratory distress.     Breath sounds: Rhonchi present. No wheezing or rales.  Chest:     Chest wall: No tenderness.  Musculoskeletal:        General: Normal range of motion.     Cervical back: Neck supple.  Lymphadenopathy:     Cervical: No cervical adenopathy.  Skin:    General: Skin is warm and dry.     Findings: No rash.  Neurological:     Mental Status: He is alert and oriented to person, place, and time.     Coordination: Coordination normal.  Psychiatric:  Behavior: Behavior normal.       Assessment & Plan:   Problem List Items Addressed This Visit       Cardiovascular and Mediastinum   Hypertension associated with diabetes (HCC)   Relevant Medications   glipiZIDE (GLUCOTROL) 5 MG tablet   sitaGLIPtin (JANUVIA) 100 MG tablet   metFORMIN (GLUCOPHAGE) 500 MG tablet   metoprolol tartrate (LOPRESSOR) 50 MG tablet   pravastatin (PRAVACHOL) 20 MG tablet     Respiratory   COPD (chronic obstructive pulmonary disease) (HCC)   Relevant Medications   BREO ELLIPTA 100-25 MCG/ACT AEPB     Endocrine   Hyperlipidemia associated with type 2 diabetes mellitus (HCC)   Relevant Medications   glipiZIDE (GLUCOTROL) 5 MG tablet   sitaGLIPtin (JANUVIA) 100 MG tablet   metFORMIN (GLUCOPHAGE) 500 MG tablet   metoprolol tartrate (LOPRESSOR) 50 MG tablet   pravastatin (PRAVACHOL) 20 MG tablet   Type 2 diabetes mellitus with other specified complication (HCC) - Primary   Relevant Medications   glipiZIDE (GLUCOTROL) 5 MG tablet   sitaGLIPtin (JANUVIA) 100 MG tablet   metFORMIN (GLUCOPHAGE) 500 MG tablet   pravastatin (PRAVACHOL) 20 MG tablet   Other Relevant Orders   Bayer DCA Hb A1c Waived (Completed)   Other Visit Diagnoses     Diabetic ulcer of toe of right foot associated with type 2 diabetes mellitus, with fat layer exposed (HCC)       Relevant Medications   glipiZIDE (GLUCOTROL) 5 MG tablet   sitaGLIPtin (JANUVIA) 100 MG tablet    metFORMIN (GLUCOPHAGE) 500 MG tablet   pravastatin (PRAVACHOL) 20 MG tablet       A1c was good at 6.3.  He is currently taking clindamycin for a wound between his right fourth and fifth toes.  He sees podiatry for this.  He is also using Betadine on this.  Recommended that he use topical antifungal as well.  Diabetic Foot Exam - Simple   Simple Foot Form Diabetic Foot exam was performed with the following findings: Yes 11/13/2023  3:50 PM  Visual Inspection See comments: Yes Sensation Testing Intact to touch and monofilament testing bilaterally: Yes Pulse Check Posterior Tibialis and Dorsalis pulse intact bilaterally: Yes Comments Stage I superficial skin breakdown on the medial aspect of the right fifth toe.  No erythema or drainage.     Continue to see podiatry for the foot ulcer.  Blood pressure and everything else looks good today. Follow up plan: Return if symptoms worsen or fail to improve.  Counseling provided for all of the vaccine components Orders Placed This Encounter  Procedures   Bayer DCA Hb A1c Waived    Arville Care, MD [facility] 11/13/2023, 3:51 PM

## 2023-11-28 DIAGNOSIS — L97511 Non-pressure chronic ulcer of other part of right foot limited to breakdown of skin: Secondary | ICD-10-CM | POA: Diagnosis not present

## 2023-12-12 DIAGNOSIS — L97511 Non-pressure chronic ulcer of other part of right foot limited to breakdown of skin: Secondary | ICD-10-CM | POA: Diagnosis not present

## 2024-01-09 DIAGNOSIS — L97511 Non-pressure chronic ulcer of other part of right foot limited to breakdown of skin: Secondary | ICD-10-CM | POA: Diagnosis not present

## 2024-01-17 ENCOUNTER — Other Ambulatory Visit: Payer: Self-pay | Admitting: Family Medicine

## 2024-01-17 DIAGNOSIS — J439 Emphysema, unspecified: Secondary | ICD-10-CM

## 2024-01-30 DIAGNOSIS — L84 Corns and callosities: Secondary | ICD-10-CM | POA: Diagnosis not present

## 2024-01-30 DIAGNOSIS — E1142 Type 2 diabetes mellitus with diabetic polyneuropathy: Secondary | ICD-10-CM | POA: Diagnosis not present

## 2024-01-30 DIAGNOSIS — B351 Tinea unguium: Secondary | ICD-10-CM | POA: Diagnosis not present

## 2024-01-30 DIAGNOSIS — M79676 Pain in unspecified toe(s): Secondary | ICD-10-CM | POA: Diagnosis not present

## 2024-02-12 ENCOUNTER — Ambulatory Visit (INDEPENDENT_AMBULATORY_CARE_PROVIDER_SITE_OTHER): Payer: 59 | Admitting: Family Medicine

## 2024-02-12 ENCOUNTER — Encounter: Payer: Self-pay | Admitting: Family Medicine

## 2024-02-12 VITALS — BP 150/76 | HR 79 | Temp 97.9°F | Ht 72.0 in | Wt 174.0 lb

## 2024-02-12 DIAGNOSIS — E785 Hyperlipidemia, unspecified: Secondary | ICD-10-CM

## 2024-02-12 DIAGNOSIS — R2681 Unsteadiness on feet: Secondary | ICD-10-CM | POA: Diagnosis not present

## 2024-02-12 DIAGNOSIS — I152 Hypertension secondary to endocrine disorders: Secondary | ICD-10-CM | POA: Diagnosis not present

## 2024-02-12 DIAGNOSIS — L219 Seborrheic dermatitis, unspecified: Secondary | ICD-10-CM | POA: Diagnosis not present

## 2024-02-12 DIAGNOSIS — E1159 Type 2 diabetes mellitus with other circulatory complications: Secondary | ICD-10-CM | POA: Diagnosis not present

## 2024-02-12 DIAGNOSIS — R269 Unspecified abnormalities of gait and mobility: Secondary | ICD-10-CM

## 2024-02-12 DIAGNOSIS — Z7984 Long term (current) use of oral hypoglycemic drugs: Secondary | ICD-10-CM

## 2024-02-12 DIAGNOSIS — E1169 Type 2 diabetes mellitus with other specified complication: Secondary | ICD-10-CM

## 2024-02-12 LAB — BAYER DCA HB A1C WAIVED: HB A1C (BAYER DCA - WAIVED): 7 % — ABNORMAL HIGH (ref 4.8–5.6)

## 2024-02-12 MED ORDER — CLOTRIMAZOLE-BETAMETHASONE 1-0.05 % EX CREA: 1.0000 | TOPICAL_CREAM | Freq: Two times a day (BID) | CUTANEOUS | 1 refills | Status: AC

## 2024-02-12 NOTE — Progress Notes (Signed)
 BP (!) 150/76   Pulse 79   Temp 97.9 F (36.6 C)   Ht 6' (1.829 m)   Wt 174 lb (78.9 kg)   SpO2 94%   BMI 23.60 kg/m    Subjective:   Patient ID: Ray Carlson, male    DOB: [birth year], 78 y.o.   MRN: [id number]  HPI: Ray Carlson is a 78 y.o. male presenting on 02/12/2024 for Medical Management of Chronic Issues and Diabetes   HPI Type 2 diabetes mellitus Patient comes in today for recheck of his diabetes. Patient has been currently taking metformin and Januvia. Patient is currently on an ACE inhibitor/ARB. Patient has not seen an ophthalmologist this year. Patient denies any new issues with their feet. The symptom started onset as an adult hypertension and hyperlipidemia ARE RELATED TO DM   Hypertension Patient is currently on lisinopril and metoprolol, and their blood pressure today is 150/76 but says it runs in the 120s and 130s over 70s at home.. Patient denies any lightheadedness or dizziness. Patient denies headaches, blurred vision, chest pains, shortness of breath, or weakness. Denies any side effects from medication and is content with current medication.   COPD Patient is coming in for COPD recheck today.  He is currently on Spiriva and Breo and albuterol.  He has a mild chronic cough but denies any major coughing spells or wheezing spells.  He has 0 nighttime symptoms per week and 0 daytime symptoms per week currently.   Hyperlipidemia Patient is coming in for recheck of his hyperlipidemia. The patient is currently taking pravastatin. They deny any issues with myalgias or history of liver damage from it. They deny any focal numbness or weakness or chest pain.   Patient has gait disturbance and weakness in his lower extremities and uses a walker or wheelchair at home.  He currently has a wheeled walker but it is wearing out and falling apart.  Scalp rash Patient has a scalp rash that is using over-the-counter creams for but then he did try some of his sons clobetasol  Betamethasone and it did help get rid of it but then it started coming back again just recently.  He says it does itch a little bit and then gets dry and flaky  Relevant past medical, surgical, family and social history reviewed and updated as indicated. Interim medical history since our last visit reviewed. Allergies and medications reviewed and updated.  Review of Systems  Constitutional:  Negative for chills and fever.  Eyes:  Negative for visual disturbance.  Respiratory:  Negative for shortness of breath and wheezing.   Cardiovascular:  Negative for chest pain and leg swelling.  Skin:  Positive for rash (Rash on his scalp that is itchy some and has come and gone over the past 6 months).  Neurological:  Negative for dizziness, weakness and light-headedness.  All other systems reviewed and are negative.   Per HPI unless specifically indicated above   Allergies as of 02/12/2024       Reactions   Penicillins         Medication List        Accurate as of February 12, 2024  3:35 PM. If you have any questions, ask your nurse or doctor.          Accu-Chek Aviva Plus test strip Generic drug: glucose blood Test bid. E11.9   Accu-Chek Aviva Plus w/Device Kit 1 each by Does not apply route daily.   Accu-Chek Softclix Lancets lancets CHECK  BLOOD GLUCOSE DAILY AS DIRECTED   albuterol 108 (90 Base) MCG/ACT inhaler Commonly known as: VENTOLIN HFA INHALE 2 PUFFS EVERY 6 HOURS AS NEEDED FOR WHEEZING OR SHORTNESS OF BREATH   Breo Ellipta 100-25 MCG/ACT Aepb Generic drug: fluticasone furoate-vilanterol Inhale 1 puff into the lungs daily.   clotrimazole-betamethasone cream Commonly known as: LOTRISONE Apply 1 Application topically 2 (two) times daily. Started by: Elige Radon Kahmari Herard   diclofenac Sodium 1 % Gel Commonly known as: Voltaren Apply 2 g topically 4 (four) times daily.   furosemide 20 MG tablet Commonly known as: LASIX Take 1 tablet (20 mg total) by mouth daily.    glipiZIDE 5 MG tablet Commonly known as: GLUCOTROL Take 1 tablet (5 mg total) by mouth 2 (two) times daily.   lisinopril 10 MG tablet Commonly known as: ZESTRIL Take 1 tablet (10 mg total) by mouth daily.   metFORMIN 500 MG tablet Commonly known as: GLUCOPHAGE Take 2 tablets (1,000 mg total) by mouth 2 (two) times daily with a meal.   metoprolol tartrate 50 MG tablet Commonly known as: LOPRESSOR Take 1 tablet (50 mg total) by mouth 2 (two) times daily.   nystatin 100000 UNIT/ML suspension Commonly known as: MYCOSTATIN Take 5 mLs (500,000 Units total) by mouth 4 (four) times daily.   pravastatin 20 MG tablet Commonly known as: PRAVACHOL Take 1 tablet (20 mg total) by mouth daily.   sitaGLIPtin 100 MG tablet Commonly known as: Januvia Take 1 tablet (100 mg total) by mouth daily.   Spiriva HandiHaler 18 MCG inhalation capsule Generic drug: tiotropium INHALE THE CONTENTS OF ONE CAPSULE DAILY AS DIRECTED   VITAMIN B 12 PO Take 1,000 mg by mouth daily.               Durable Medical Equipment  (From admission, onward)           Start     Ordered   02/12/24 0000  For home use only DME 4 wheeled rolling walker with seat ([id number])       Comments: Wheeled walker with a seat  Question Answer Comment  Patient needs a walker to treat with the following condition Weakness   Patient needs a walker to treat with the following condition Gait instability      02/12/24 1535             Objective:   BP (!) 150/76   Pulse 79   Temp 97.9 F (36.6 C)   Ht 6' (1.829 m)   Wt 174 lb (78.9 kg)   SpO2 94%   BMI 23.60 kg/m   Wt Readings from Last 3 Encounters:  02/12/24 174 lb (78.9 kg)  11/13/23 179 lb (81.2 kg)  08/29/23 174 lb (78.9 kg)    Physical Exam Vitals and nursing note reviewed.  Constitutional:      General: He is not in acute distress.    Appearance: Normal appearance. He is well-developed. He is not diaphoretic.  Eyes:     General: No scleral  icterus.    Conjunctiva/sclera: Conjunctivae normal.  Neck:     Thyroid: No thyromegaly.  Cardiovascular:     Rate and Rhythm: Normal rate and regular rhythm.     Heart sounds: Normal heart sounds. No murmur heard. Pulmonary:     Effort: Pulmonary effort is normal. No respiratory distress.     Breath sounds: Normal breath sounds. No wheezing.  Musculoskeletal:        General: No swelling. Normal range of motion.  Cervical back: Neck supple.  Lymphadenopathy:     Cervical: No cervical adenopathy.  Skin:    General: Skin is warm and dry.     Findings: Rash (Scattered fine pink papules in his scalp) present.  Neurological:     Mental Status: He is alert and oriented to person, place, and time.     Coordination: Coordination normal.  Psychiatric:        Behavior: Behavior normal.       Assessment & Plan:   Problem List Items Addressed This Visit       Cardiovascular and Mediastinum   Hypertension associated with diabetes (HCC)   Relevant Orders   CBC with Differential/Platelet   CMP14+EGFR   Lipid panel   Bayer DCA Hb A1c Waived   PSA, total and free     Endocrine   Hyperlipidemia associated with type 2 diabetes mellitus (HCC)   Relevant Orders   CBC with Differential/Platelet   CMP14+EGFR   Lipid panel   Bayer DCA Hb A1c Waived   PSA, total and free   Type 2 diabetes mellitus with other specified complication (HCC) - Primary   Relevant Orders   CBC with Differential/Platelet   CMP14+EGFR   Lipid panel   Bayer DCA Hb A1c Waived   PSA, total and free     Other   Gait disturbance   Relevant Orders   For home use only DME 4 wheeled rolling walker with seat ([id number])   Other Visit Diagnoses       Seborrheic dermatitis of scalp       Relevant Medications   clotrimazole-betamethasone (LOTRISONE) cream     Gait instability       Relevant Orders   For home use only DME 4 wheeled rolling walker with seat ([id number])     Gave clotrimazole/betamethasone  cream for his scalp.  Gave him a prescription for a walker as well, patient has had previously had a wheeled walker and his is just starting to fall apart.  He is unstable without it and uses either wheelchair or the walker  Follow up plan: Return in about 3 months (around 05/14/2024), or if symptoms worsen or fail to improve, for Diabetes recheck.  Counseling provided for all of the vaccine components Orders Placed This Encounter  Procedures   For home use only DME 4 wheeled rolling walker with seat ([id number])   CBC with Differential/Platelet   CMP14+EGFR   Lipid panel   Bayer DCA Hb A1c Waived   PSA, total and free    Arville Care, MD [facility] 02/12/2024, 3:35 PM

## 2024-02-13 LAB — CBC WITH DIFFERENTIAL/PLATELET
Basophils Absolute: 0 10*3/uL (ref 0.0–0.2)
Basos: 1 %
EOS (ABSOLUTE): 0.3 10*3/uL (ref 0.0–0.4)
Eos: 4 %
Hematocrit: 34 % — ABNORMAL LOW (ref 37.5–51.0)
Hemoglobin: 11 g/dL — ABNORMAL LOW (ref 13.0–17.7)
Immature Grans (Abs): 0 10*3/uL (ref 0.0–0.1)
Immature Granulocytes: 1 %
Lymphocytes Absolute: 1.4 10*3/uL (ref 0.7–3.1)
Lymphs: 24 %
MCH: 29.6 pg (ref 26.6–33.0)
MCHC: 32.4 g/dL (ref 31.5–35.7)
MCV: 92 fL (ref 79–97)
Monocytes Absolute: 0.8 10*3/uL (ref 0.1–0.9)
Monocytes: 14 %
Neutrophils Absolute: 3.4 10*3/uL (ref 1.4–7.0)
Neutrophils: 56 %
Platelets: 256 10*3/uL (ref 150–450)
RBC: 3.71 x10E6/uL — ABNORMAL LOW (ref 4.14–5.80)
RDW: 15.7 % — ABNORMAL HIGH (ref 11.6–15.4)
WBC: 5.9 10*3/uL (ref 3.4–10.8)

## 2024-02-13 LAB — CMP14+EGFR
ALT: 18 IU/L (ref 0–44)
AST: 16 IU/L (ref 0–40)
Albumin: 4.2 g/dL (ref 3.8–4.8)
Alkaline Phosphatase: 59 IU/L (ref 44–121)
BUN/Creatinine Ratio: 16 (ref 10–24)
BUN: 18 mg/dL (ref 8–27)
Bilirubin Total: 0.2 mg/dL (ref 0.0–1.2)
CO2: 25 mmol/L (ref 20–29)
Calcium: 9.4 mg/dL (ref 8.6–10.2)
Chloride: 93 mmol/L — ABNORMAL LOW (ref 96–106)
Creatinine, Ser: 1.12 mg/dL (ref 0.76–1.27)
Globulin, Total: 2.6 g/dL (ref 1.5–4.5)
Glucose: 114 mg/dL — ABNORMAL HIGH (ref 70–99)
Potassium: 5.4 mmol/L — ABNORMAL HIGH (ref 3.5–5.2)
Sodium: 130 mmol/L — ABNORMAL LOW (ref 134–144)
Total Protein: 6.8 g/dL (ref 6.0–8.5)
eGFR: 67 mL/min/{1.73_m2} (ref >59)

## 2024-02-13 LAB — LIPID PANEL
Chol/HDL Ratio: 3.3 ratio (ref 0.0–5.0)
Cholesterol, Total: 135 mg/dL (ref 100–199)
HDL: 41 mg/dL (ref >39)
LDL Chol Calc (NIH): 75 mg/dL (ref 0–99)
Triglycerides: 102 mg/dL (ref 0–149)
VLDL Cholesterol Cal: 19 mg/dL (ref 5–40)

## 2024-02-13 LAB — PSA, TOTAL AND FREE
PSA, Free Pct: 60 %
PSA, Free: 0.18 ng/mL
Prostate Specific Ag, Serum: 0.3 ng/mL (ref 0.0–4.0)

## 2024-02-26 DIAGNOSIS — Z961 Presence of intraocular lens: Secondary | ICD-10-CM | POA: Diagnosis not present

## 2024-02-26 DIAGNOSIS — E119 Type 2 diabetes mellitus without complications: Secondary | ICD-10-CM | POA: Diagnosis not present

## 2024-02-26 DIAGNOSIS — H353111 Nonexudative age-related macular degeneration, right eye, early dry stage: Secondary | ICD-10-CM | POA: Diagnosis not present

## 2024-02-26 DIAGNOSIS — H353122 Nonexudative age-related macular degeneration, left eye, intermediate dry stage: Secondary | ICD-10-CM | POA: Diagnosis not present

## 2024-04-09 DIAGNOSIS — M79675 Pain in left toe(s): Secondary | ICD-10-CM | POA: Diagnosis not present

## 2024-04-09 DIAGNOSIS — L84 Corns and callosities: Secondary | ICD-10-CM | POA: Diagnosis not present

## 2024-04-09 DIAGNOSIS — B351 Tinea unguium: Secondary | ICD-10-CM | POA: Diagnosis not present

## 2024-04-09 DIAGNOSIS — E1142 Type 2 diabetes mellitus with diabetic polyneuropathy: Secondary | ICD-10-CM | POA: Diagnosis not present

## 2024-04-09 DIAGNOSIS — M79674 Pain in right toe(s): Secondary | ICD-10-CM | POA: Diagnosis not present

## 2024-04-23 DIAGNOSIS — L97521 Non-pressure chronic ulcer of other part of left foot limited to breakdown of skin: Secondary | ICD-10-CM | POA: Diagnosis not present

## 2024-05-07 DIAGNOSIS — L97521 Non-pressure chronic ulcer of other part of left foot limited to breakdown of skin: Secondary | ICD-10-CM | POA: Diagnosis not present

## 2024-05-15 ENCOUNTER — Encounter: Payer: Self-pay | Admitting: Family Medicine

## 2024-05-15 ENCOUNTER — Ambulatory Visit: Admitting: Family Medicine

## 2024-05-15 VITALS — BP 158/68 | HR 93 | Ht 72.0 in | Wt 173.0 lb

## 2024-05-15 DIAGNOSIS — E11621 Type 2 diabetes mellitus with foot ulcer: Secondary | ICD-10-CM

## 2024-05-15 DIAGNOSIS — R269 Unspecified abnormalities of gait and mobility: Secondary | ICD-10-CM

## 2024-05-15 DIAGNOSIS — E1169 Type 2 diabetes mellitus with other specified complication: Secondary | ICD-10-CM

## 2024-05-15 DIAGNOSIS — L97529 Non-pressure chronic ulcer of other part of left foot with unspecified severity: Secondary | ICD-10-CM | POA: Diagnosis not present

## 2024-05-15 DIAGNOSIS — I152 Hypertension secondary to endocrine disorders: Secondary | ICD-10-CM

## 2024-05-15 DIAGNOSIS — E785 Hyperlipidemia, unspecified: Secondary | ICD-10-CM

## 2024-05-15 DIAGNOSIS — Z7984 Long term (current) use of oral hypoglycemic drugs: Secondary | ICD-10-CM | POA: Diagnosis not present

## 2024-05-15 DIAGNOSIS — E1159 Type 2 diabetes mellitus with other circulatory complications: Secondary | ICD-10-CM | POA: Diagnosis not present

## 2024-05-15 LAB — BAYER DCA HB A1C WAIVED: HB A1C (BAYER DCA - WAIVED): 8.2 % — ABNORMAL HIGH (ref 4.8–5.6)

## 2024-05-15 MED ORDER — ACCU-CHEK SOFTCLIX LANCETS MISC: 3 refills | Status: AC

## 2024-05-15 MED ORDER — LISINOPRIL 10 MG PO TABS: 10.0000 mg | ORAL_TABLET | Freq: Every day | ORAL | 3 refills | Status: AC

## 2024-05-15 MED ORDER — ACCU-CHEK AVIVA PLUS VI STRP: ORAL_STRIP | 2 refills | Status: AC

## 2024-05-15 MED ORDER — FUROSEMIDE 20 MG PO TABS: 20.0000 mg | ORAL_TABLET | Freq: Every day | ORAL | 1 refills | Status: AC

## 2024-05-15 NOTE — Progress Notes (Signed)
 BP (!) 158/68   Pulse 93   Ht 6' (1.829 m)   Wt 173 lb (78.5 kg)   SpO2 92%   BMI 23.46 kg/m    Subjective:   Patient ID: Ray Carlson, male    DOB: November 22, 1946, 78 y.o.   MRN: [id number]  HPI: Ray Carlson is a 78 y.o. male presenting on 05/15/2024 for Medical Management of Chronic Issues, Diabetes, and Hypertension   HPI Type 2 diabetes mellitus Patient comes in today for recheck of his diabetes. Patient has been currently taking metformin  and glipizide  and Januvia . Patient is currently on an ACE inhibitor/ARB. Patient has seen an ophthalmologist this year. Patient denies any new issues with their feet. The symptom started onset as an adult hypertension and hyperlipidemia ARE RELATED TO DM   Hypertension Patient is currently on lisinopril , and their blood pressure today is 158/68. Patient denies any lightheadedness or dizziness. Patient denies headaches, blurred vision, chest pains, shortness of breath, or weakness. Denies any side effects from medication and is content with current medication.   Hyperlipidemia Patient is coming in for recheck of his hyperlipidemia. The patient is currently taking pravastatin . They deny any issues with myalgias or history of liver damage from it. They deny any focal numbness or weakness or chest pain.   Patient has diabetic foot ulcer and seeing podiatry on his left foot and wants to get diabetic shoes.  Relevant past medical, surgical, family and social history reviewed and updated as indicated. Interim medical history since our last visit reviewed. Allergies and medications reviewed and updated.  Review of Systems  Constitutional:  Negative for chills and fever.  Respiratory:  Negative for shortness of breath and wheezing.   Cardiovascular:  Negative for chest pain and leg swelling.  Musculoskeletal:  Negative for back pain and gait problem.  Skin:  Positive for wound (Left foot, blistering and wound). Negative for rash.  Neurological:  Negative  for dizziness and light-headedness.  All other systems reviewed and are negative.   Per HPI unless specifically indicated above   Allergies as of 05/15/2024       Reactions   Penicillins         Medication List        Accurate as of May 15, 2024  3:50 PM. If you have any questions, ask your nurse or doctor.          Accu-Chek Aviva Plus test strip Generic drug: glucose blood Test bid. E11.9   Accu-Chek Aviva Plus w/Device Kit 1 each by Does not apply route daily.   Accu-Chek Softclix Lancets lancets CHECK BLOOD GLUCOSE DAILY AS DIRECTED   albuterol  108 (90 Base) MCG/ACT inhaler Commonly known as: VENTOLIN  HFA INHALE 2 PUFFS EVERY 6 HOURS AS NEEDED FOR WHEEZING OR SHORTNESS OF BREATH   Breo Ellipta  100-25 MCG/ACT Aepb Generic drug: fluticasone  furoate-vilanterol Inhale 1 puff into the lungs daily.   diclofenac  Sodium 1 % Gel Commonly known as: Voltaren  Apply 2 g topically 4 (four) times daily.   furosemide  20 MG tablet Commonly known as: LASIX  Take 1 tablet (20 mg total) by mouth daily.   glipiZIDE  5 MG tablet Commonly known as: GLUCOTROL  Take 1 tablet (5 mg total) by mouth 2 (two) times daily.   lisinopril  10 MG tablet Commonly known as: ZESTRIL  Take 1 tablet (10 mg total) by mouth daily.   metFORMIN  500 MG tablet Commonly known as: GLUCOPHAGE  Take 2 tablets (1,000 mg total) by mouth 2 (two) times daily with  a meal.   metoprolol  tartrate 50 MG tablet Commonly known as: LOPRESSOR  Take 1 tablet (50 mg total) by mouth 2 (two) times daily.   nystatin  100000 UNIT/ML suspension Commonly known as: MYCOSTATIN  Take 5 mLs (500,000 Units total) by mouth 4 (four) times daily.   pravastatin  20 MG tablet Commonly known as: PRAVACHOL  Take 1 tablet (20 mg total) by mouth daily.   sitaGLIPtin  100 MG tablet Commonly known as: Januvia  Take 1 tablet (100 mg total) by mouth daily.   Spiriva  HandiHaler 18 MCG inhalation capsule Generic drug: tiotropium INHALE  THE CONTENTS OF ONE CAPSULE DAILY AS DIRECTED   VITAMIN B 12 PO Take 1,000 mg by mouth daily.               Durable Medical Equipment  (From admission, onward)           Start     Ordered   05/15/24 0000  For Home Use Only DME Diabetic Shoe       Comments: Diabetic foot ulcer left foot DME diabetic shoes.   05/15/24 1549             Objective:   BP (!) 158/68   Pulse 93   Ht 6' (1.829 m)   Wt 173 lb (78.5 kg)   SpO2 92%   BMI 23.46 kg/m   Wt Readings from Last 3 Encounters:  05/15/24 173 lb (78.5 kg)  02/12/24 174 lb (78.9 kg)  11/13/23 179 lb (81.2 kg)    Physical Exam Vitals and nursing note reviewed.  Constitutional:      General: He is not in acute distress.    Appearance: He is well-developed. He is not diaphoretic.  Eyes:     General: No scleral icterus.    Conjunctiva/sclera: Conjunctivae normal.  Neck:     Thyroid: No thyromegaly.  Cardiovascular:     Rate and Rhythm: Normal rate and regular rhythm.     Heart sounds: Normal heart sounds. No murmur heard. Pulmonary:     Effort: Pulmonary effort is normal. No respiratory distress.     Breath sounds: Normal breath sounds. No wheezing.  Musculoskeletal:     Cervical back: Neck supple.  Lymphadenopathy:     Cervical: No cervical adenopathy.  Skin:    General: Skin is warm and dry.     Findings: No rash.     Comments: Blister and wound on the bottom of the left foot, has it wrapped today, sees podiatry  Neurological:     Mental Status: He is alert and oriented to person, place, and time.     Coordination: Coordination normal.  Psychiatric:        Behavior: Behavior normal.       Assessment & Plan:   Problem List Items Addressed This Visit       Cardiovascular and Mediastinum   Hypertension associated with diabetes (HCC)   Relevant Medications   furosemide  (LASIX ) 20 MG tablet   lisinopril  (ZESTRIL ) 10 MG tablet     Endocrine   Hyperlipidemia associated with type 2 diabetes  mellitus (HCC)   Relevant Medications   furosemide  (LASIX ) 20 MG tablet   lisinopril  (ZESTRIL ) 10 MG tablet   Type 2 diabetes mellitus with other specified complication (HCC) - Primary   Relevant Medications   glucose blood (ACCU-CHEK AVIVA PLUS) test strip   lisinopril  (ZESTRIL ) 10 MG tablet   Other Relevant Orders   Bayer DCA Hb A1c Waived   BMP8+EGFR     Other  Gait disturbance   Relevant Orders   Walker standard   Other Visit Diagnoses       Diabetic ulcer of left foot associated with type 2 diabetes mellitus, unspecified part of foot, unspecified ulcer stage (HCC)       Relevant Medications   lisinopril  (ZESTRIL ) 10 MG tablet   Other Relevant Orders   For Home Use Only DME Diabetic Shoe   Walker standard       Patient also has some weakness with gait disturbance and uses a walker and needs replacement of metal walker  Placed order for diabetic shoes. Follow up plan: Return in about 3 months (around 08/15/2024), or if symptoms worsen or fail to improve, for Diabetes recheck.  Counseling provided for all of the vaccine components Orders Placed This Encounter  Procedures   For Home Use Only DME Diabetic Shoe   Walker standard   Bayer DCA Hb A1c Waived   BMP8+EGFR    Jolyne Needs, MD [facility] 05/15/2024, 3:50 PM

## 2024-05-16 ENCOUNTER — Ambulatory Visit: Payer: Self-pay | Admitting: Family Medicine

## 2024-05-16 LAB — BMP8+EGFR
BUN/Creatinine Ratio: 17 (ref 10–24)
BUN: 23 mg/dL (ref 8–27)
CO2: 22 mmol/L (ref 20–29)
Calcium: 9.6 mg/dL (ref 8.6–10.2)
Chloride: 94 mmol/L — ABNORMAL LOW (ref 96–106)
Creatinine, Ser: 1.36 mg/dL — ABNORMAL HIGH (ref 0.76–1.27)
Glucose: 144 mg/dL — ABNORMAL HIGH (ref 70–99)
Potassium: 4.5 mmol/L (ref 3.5–5.2)
Sodium: 133 mmol/L — ABNORMAL LOW (ref 134–144)
eGFR: 53 mL/min/{1.73_m2} — ABNORMAL LOW (ref >59)

## 2024-05-23 DIAGNOSIS — L97521 Non-pressure chronic ulcer of other part of left foot limited to breakdown of skin: Secondary | ICD-10-CM | POA: Diagnosis not present

## 2024-07-18 DIAGNOSIS — B351 Tinea unguium: Secondary | ICD-10-CM | POA: Diagnosis not present

## 2024-07-18 DIAGNOSIS — E1142 Type 2 diabetes mellitus with diabetic polyneuropathy: Secondary | ICD-10-CM | POA: Diagnosis not present

## 2024-07-18 DIAGNOSIS — L84 Corns and callosities: Secondary | ICD-10-CM | POA: Diagnosis not present

## 2024-07-18 DIAGNOSIS — M79674 Pain in right toe(s): Secondary | ICD-10-CM | POA: Diagnosis not present

## 2024-07-18 DIAGNOSIS — M79675 Pain in left toe(s): Secondary | ICD-10-CM | POA: Diagnosis not present

## 2024-07-19 ENCOUNTER — Other Ambulatory Visit: Payer: Self-pay | Admitting: Family Medicine

## 2024-08-15 ENCOUNTER — Encounter: Payer: Self-pay | Admitting: Family Medicine

## 2024-08-15 ENCOUNTER — Ambulatory Visit: Admitting: Family Medicine

## 2024-08-15 VITALS — BP 145/78 | HR 75 | Ht 72.0 in | Wt 182.0 lb

## 2024-08-15 DIAGNOSIS — Z7984 Long term (current) use of oral hypoglycemic drugs: Secondary | ICD-10-CM | POA: Diagnosis not present

## 2024-08-15 DIAGNOSIS — E1169 Type 2 diabetes mellitus with other specified complication: Secondary | ICD-10-CM

## 2024-08-15 DIAGNOSIS — I152 Hypertension secondary to endocrine disorders: Secondary | ICD-10-CM

## 2024-08-15 DIAGNOSIS — J449 Chronic obstructive pulmonary disease, unspecified: Secondary | ICD-10-CM

## 2024-08-15 DIAGNOSIS — Z23 Encounter for immunization: Secondary | ICD-10-CM | POA: Diagnosis not present

## 2024-08-15 DIAGNOSIS — E785 Hyperlipidemia, unspecified: Secondary | ICD-10-CM

## 2024-08-15 DIAGNOSIS — E1159 Type 2 diabetes mellitus with other circulatory complications: Secondary | ICD-10-CM

## 2024-08-15 DIAGNOSIS — J439 Emphysema, unspecified: Secondary | ICD-10-CM

## 2024-08-15 LAB — LIPID PANEL

## 2024-08-15 LAB — BAYER DCA HB A1C WAIVED: HB A1C (BAYER DCA - WAIVED): 6.8 % — ABNORMAL HIGH (ref 4.8–5.6)

## 2024-08-15 NOTE — Progress Notes (Signed)
 BP (!) 145/78   Pulse 75   Ht 6' (1.829 m)   Wt 182 lb (82.6 kg)   SpO2 97%   BMI 24.68 kg/m    Subjective:   Patient ID: Ray Carlson, male    DOB: [birth year], 78 y.o.   MRN: [id number]  HPI: Ray Carlson is a 78 y.o. male presenting on 08/15/2024 for Medical Management of Chronic Issues and Diabetes   Discussed the use of AI scribe software for clinical note transcription with the patient, who gave verbal consent to proceed.  History of Present Illness   Ray Carlson is a 78 year old male with diabetes, hypertension, and COPD who presents for a recheck.  His blood sugar levels have improved since the last visit. He has been drinking more water and avoiding sweets. He continues to take glipizide , metformin , and Januvia  for diabetes management.  Blood pressure readings at home have been around 125/85 mmHg, although it was higher in the office previously. He continues to take lisinopril  and Lasix  for blood pressure control. He is also on pravastatin  and metoprolol .  Regarding his COPD, he has been using Breo more frequently during hot weather. He experiences some wheezing and coughing in hot weather but reports improvement as the weather has cooled. He continues to use Spiriva  and has an albuterol  inhaler for rescue use.  He notes some swelling in his legs, which is better than in the past. He has recently acquired a device to help with leg movement.          Relevant past medical, surgical, family and social history reviewed and updated as indicated. Interim medical history since our last visit reviewed. Allergies and medications reviewed and updated.  Review of Systems  Constitutional:  Negative for chills and fever.  Eyes:  Negative for visual disturbance.  Respiratory:  Negative for shortness of breath and wheezing.   Cardiovascular:  Positive for leg swelling. Negative for chest pain.  Skin:  Negative for rash.  Neurological:  Negative for dizziness and light-headedness.   All other systems reviewed and are negative.   Per HPI unless specifically indicated above   Allergies as of 08/15/2024       Reactions   Penicillins         Medication List        Accurate as of August 15, 2024  4:03 PM. If you have any questions, ask your nurse or doctor.          Accu-Chek Aviva Plus test strip Generic drug: glucose blood Test bid. E11.9   Accu-Chek Aviva Plus w/Device Kit 1 each by Does not apply route daily.   Accu-Chek Softclix Lancets lancets CHECK BLOOD GLUCOSE DAILY AS DIRECTED   albuterol  108 (90 Base) MCG/ACT inhaler Commonly known as: VENTOLIN  HFA INHALE 2 PUFFS EVERY 6 HOURS AS NEEDED FOR WHEEZING OR SHORTNESS OF BREATH   Breo Ellipta  100-25 MCG/ACT Aepb Generic drug: fluticasone  furoate-vilanterol Inhale 1 puff into the lungs daily.   diclofenac  Sodium 1 % Gel Commonly known as: Voltaren  Apply 2 g topically 4 (four) times daily.   furosemide  20 MG tablet Commonly known as: LASIX  Take 1 tablet (20 mg total) by mouth daily.   glipiZIDE  5 MG tablet Commonly known as: GLUCOTROL  Take 1 tablet (5 mg total) by mouth 2 (two) times daily.   lisinopril  10 MG tablet Commonly known as: ZESTRIL  Take 1 tablet (10 mg total) by mouth daily.   metFORMIN  500 MG tablet Commonly known  as: GLUCOPHAGE  Take 2 tablets (1,000 mg total) by mouth 2 (two) times daily with a meal.   metoprolol  tartrate 50 MG tablet Commonly known as: LOPRESSOR  Take 1 tablet (50 mg total) by mouth 2 (two) times daily.   nystatin  100000 UNIT/ML suspension Commonly known as: MYCOSTATIN  Take 5 mLs (500,000 Units total) by mouth 4 (four) times daily.   pravastatin  20 MG tablet Commonly known as: PRAVACHOL  Take 1 tablet (20 mg total) by mouth daily.   sitaGLIPtin  100 MG tablet Commonly known as: Januvia  Take 1 tablet (100 mg total) by mouth daily.   Spiriva  HandiHaler 18 MCG inhalation capsule Generic drug: tiotropium INHALE THE CONTENTS OF ONE CAPSULE  DAILY AS DIRECTED   VITAMIN B 12 PO Take 1,000 mg by mouth daily.         Objective:   BP (!) 145/78   Pulse 75   Ht 6' (1.829 m)   Wt 182 lb (82.6 kg)   SpO2 97%   BMI 24.68 kg/m   Wt Readings from Last 3 Encounters:  08/15/24 182 lb (82.6 kg)  05/15/24 173 lb (78.5 kg)  02/12/24 174 lb (78.9 kg)    Physical Exam Physical Exam   VITALS: BP- 145/78 CHEST: Lungs clear to auscultation bilaterally. CARDIOVASCULAR: Heart regular rate and rhythm, no murmurs. EXTREMITIES: 1+ edema in extremities, not severe.         Assessment & Plan:   Problem List Items Addressed This Visit       Cardiovascular and Mediastinum   Hypertension associated with diabetes (HCC) - Primary   Relevant Orders   Bayer DCA Hb A1c Waived   CBC with Differential/Platelet   CMP14+EGFR   Lipid panel     Respiratory   COPD (chronic obstructive pulmonary disease) (HCC)     Endocrine   Hyperlipidemia associated with type 2 diabetes mellitus (HCC)   Relevant Orders   Bayer DCA Hb A1c Waived   CBC with Differential/Platelet   CMP14+EGFR   Lipid panel   Type 2 diabetes mellitus with other specified complication (HCC)   Relevant Orders   Microalbumin/Creatinine Ratio, Urine      Type 2 diabetes mellitus Managed with glipizide , metformin , and Januvia . Blood sugar levels improved with increased water intake and reduced sweets. Awaiting A1c results for further assessment. - Continue glipizide , metformin , and Januvia . - A1c is 6.8 which is better than last time, continue current medicine  Hypertension Managed with lisinopril  and Lasix . Home blood pressure readings normal; office readings slightly elevated due to situational factors. - Continue lisinopril  and Lasix . - Monitor blood pressure at home.  Chronic obstructive pulmonary disease (COPD) Symptoms exacerbated by hot weather, improved with cooler weather. Managed with Breo, Spiriva , and albuterol  inhaler for rescue. - Continue Breo and  Spiriva . - Use albuterol  inhaler as needed.  Hyperlipidemia Managed with pravastatin . - Continue pravastatin .  Lower extremity edema Mild (1+) edema, improved. Managed with leg elevation and pedal device use. - Encourage leg elevation when sitting. - Use pedal device to promote circulation.      Follow up plan: Return in about 3 months (around 11/14/2024), or if symptoms worsen or fail to improve, for Diabetes and hypertension recheck.  Counseling provided for all of the vaccine components Orders Placed This Encounter  Procedures   Bayer DCA Hb A1c Waived   CBC with Differential/Platelet   CMP14+EGFR   Lipid panel   Microalbumin/Creatinine Ratio, Urine    Fonda Levins, MD [facility] 08/15/2024, 4:03 PM

## 2024-08-16 LAB — LIPID PANEL
Cholesterol, Total: 132 mg/dL (ref 100–199)
HDL: 47 mg/dL (ref >39)
LDL CALC COMMENT:: 2.8 ratio (ref 0.0–5.0)
LDL Chol Calc (NIH): 66 mg/dL (ref 0–99)
Triglycerides: 101 mg/dL (ref 0–149)
VLDL Cholesterol Cal: 19 mg/dL (ref 5–40)

## 2024-08-16 LAB — CMP14+EGFR
ALT: 24 IU/L (ref 0–44)
AST: 23 IU/L (ref 0–40)
Albumin: 4.1 g/dL (ref 3.8–4.8)
Alkaline Phosphatase: 59 IU/L (ref 44–121)
BUN/Creatinine Ratio: 16 (ref 10–24)
BUN: 18 mg/dL (ref 8–27)
Bilirubin Total: 0.3 mg/dL (ref 0.0–1.2)
CO2: 24 mmol/L (ref 20–29)
Calcium: 9.3 mg/dL (ref 8.6–10.2)
Chloride: 94 mmol/L — AB (ref 96–106)
Creatinine, Ser: 1.12 mg/dL (ref 0.76–1.27)
Globulin, Total: 3 g/dL (ref 1.5–4.5)
Glucose: 68 mg/dL — AB (ref 70–99)
Potassium: 5.6 mmol/L — AB (ref 3.5–5.2)
Sodium: 132 mmol/L — AB (ref 134–144)
Total Protein: 7.1 g/dL (ref 6.0–8.5)
eGFR: 67 mL/min/1.73 (ref >59)

## 2024-08-16 LAB — CBC WITH DIFFERENTIAL/PLATELET
Basophils Absolute: 0 x10E3/uL (ref 0.0–0.2)
Basos: 1 %
EOS (ABSOLUTE): 0.3 x10E3/uL (ref 0.0–0.4)
Eos: 4 %
Hematocrit: 34.2 % — ABNORMAL LOW (ref 37.5–51.0)
Hemoglobin: 11.1 g/dL — ABNORMAL LOW (ref 13.0–17.7)
Immature Grans (Abs): 0 x10E3/uL (ref 0.0–0.1)
Immature Granulocytes: 0 %
Lymphocytes Absolute: 1.6 x10E3/uL (ref 0.7–3.1)
Lymphs: 20 %
MCH: 30 pg (ref 26.6–33.0)
MCHC: 32.5 g/dL (ref 31.5–35.7)
MCV: 92 fL (ref 79–97)
Monocytes Absolute: 1.2 x10E3/uL — ABNORMAL HIGH (ref 0.1–0.9)
Monocytes: 15 %
Neutrophils Absolute: 4.8 x10E3/uL (ref 1.4–7.0)
Neutrophils: 60 %
Platelets: 282 x10E3/uL (ref 150–450)
RBC: 3.7 x10E6/uL — ABNORMAL LOW (ref 4.14–5.80)
RDW: 14.6 % (ref 11.6–15.4)
WBC: 8 x10E3/uL (ref 3.4–10.8)

## 2024-08-16 LAB — MICROALBUMIN / CREATININE URINE RATIO
Creatinine, Urine: 46.2 mg/dL
Microalb/Creat Ratio: 17 mg/g{creat} (ref 0–29)
Microalbumin, Urine: 7.9 ug/mL

## 2024-08-22 ENCOUNTER — Ambulatory Visit: Payer: Self-pay | Admitting: Family Medicine

## 2024-08-22 NOTE — Progress Notes (Signed)
 Reviewed results with patient and patient voiced understanding.

## 2024-08-29 ENCOUNTER — Ambulatory Visit: Payer: Medicare Other

## 2024-08-29 NOTE — Progress Notes (Deleted)
 Subjective:   Ray Carlson is a 78 y.o. who presents for a Medicare Wellness preventive visit.  As a reminder, Annual Wellness Visits don't include a physical exam, and some assessments may be limited, especially if this visit is performed virtually. We may recommend an in-person follow-up visit with your provider if needed.  Visit Complete: {VISITMETHODVS:[id number]}  {AWVVIDEO:32072}  Persons Participating in Visit: {Persons Participating in Visit:32444}  AWV Questionnaire: {AWVQuestionnaire:32338}        Objective:    There were no vitals filed for this visit. There is no height or weight on file to calculate BMI.     08/29/2023    2:39 PM 08/01/2022    2:45 PM  Advanced Directives  Does Patient Have a Medical Advance Directive? No Yes  Type of Special educational needs teacher of [place];Living will  Does patient want to make changes to medical advance directive?  No - Guardian declined  Copy of Healthcare Power of Attorney in Chart?  No - copy requested  Would patient like information on creating a medical advance directive? Yes (MAU/Ambulatory/Procedural Areas - Information given)     Current Medications (verified) Outpatient Encounter Medications as of 08/29/2024  Medication Sig   Accu-Chek Softclix Lancets lancets CHECK BLOOD GLUCOSE DAILY AS DIRECTED   albuterol  (VENTOLIN  HFA) 108 (90 Base) MCG/ACT inhaler INHALE 2 PUFFS EVERY 6 HOURS AS NEEDED FOR WHEEZING OR SHORTNESS OF BREATH   Blood Glucose Monitoring Suppl (ACCU-CHEK AVIVA PLUS) w/Device KIT 1 each by Does not apply route daily.   BREO ELLIPTA  100-25 MCG/ACT AEPB Inhale 1 puff into the lungs daily.   Cyanocobalamin (VITAMIN B 12 PO) Take 1,000 mg by mouth daily.   diclofenac  Sodium (VOLTAREN ) 1 % GEL Apply 2 g topically 4 (four) times daily.   furosemide  (LASIX ) 20 MG tablet Take 1 tablet (20 mg total) by mouth daily.   glipiZIDE  (GLUCOTROL ) 5 MG tablet Take 1 tablet (5 mg total) by mouth 2 (two) times  daily.   glucose blood (ACCU-CHEK AVIVA PLUS) test strip Test bid. E11.9   lisinopril  (ZESTRIL ) 10 MG tablet Take 1 tablet (10 mg total) by mouth daily.   metFORMIN  (GLUCOPHAGE ) 500 MG tablet Take 2 tablets (1,000 mg total) by mouth 2 (two) times daily with a meal.   metoprolol  tartrate (LOPRESSOR ) 50 MG tablet Take 1 tablet (50 mg total) by mouth 2 (two) times daily.   nystatin  (MYCOSTATIN ) 100000 UNIT/ML suspension Take 5 mLs (500,000 Units total) by mouth 4 (four) times daily.   pravastatin  (PRAVACHOL ) 20 MG tablet Take 1 tablet (20 mg total) by mouth daily.   sitaGLIPtin  (JANUVIA ) 100 MG tablet Take 1 tablet (100 mg total) by mouth daily.   tiotropium (SPIRIVA  HANDIHALER) 18 MCG inhalation capsule INHALE THE CONTENTS OF ONE CAPSULE DAILY AS DIRECTED   No facility-administered encounter medications on file as of 08/29/2024.    Allergies (verified) Penicillins   History: Past Medical History:  Diagnosis Date   Diabetes mellitus without complication (HCC)    Hyperlipidemia    Hypertension    Tobacco user    Vitamin D  deficiency    Past Surgical History:  Procedure Laterality Date   CATARACT EXTRACTION     Family History  Problem Relation Age of Onset   Hypertension Mother    Heart disease Mother    Diabetes Mother    Heart failure Mother    Hypertension Father    Leukemia Father    Diabetes Sister    Diabetes Brother  Healthy Son    Diabetes Brother    Diabetes Brother    Diabetes Sister    Healthy Son    Social History   Socioeconomic History   Marital status: Divorced    Spouse name: Not on file   Number of children: 2   Years of education: 12   Highest education level: 12th grade  Occupational History   Occupation: Retired    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  Tobacco Use   Smoking status: Former    Current packs/day: 0.00    Types: Cigarettes    Quit date: 01/01/2016    Years since quitting: 8.6    Passive exposure: Past   Smokeless  tobacco: Never   Tobacco comments:    Verified by son, Ray Carlson  Vaping Use   Vaping status: Never Used  Substance and Sexual Activity   Alcohol use: No   Drug use: No   Sexual activity: Not Currently    Partners: Female  Other Topics Concern   Not on file  Social History Narrative   Not on file   Social Drivers of Health   Financial Resource Strain: Low Risk  (08/29/2023)   Overall Financial Resource Strain (CARDIA)    Difficulty of Paying Living Expenses: Not hard at all  Food Insecurity: No Food Insecurity (08/29/2023)   Hunger Vital Sign    Worried About Running Out of Food in the Last Year: Never true    Ran Out of Food in the Last Year: Never true  Transportation Needs: No Transportation Needs (08/29/2023)   PRAPARE - Administrator, Civil Service (Medical): No    Lack of Transportation (Non-Medical): No  Physical Activity: Inactive (08/29/2023)   Exercise Vital Sign    Days of Exercise per Week: 0 days    Minutes of Exercise per Session: 0 min  Stress: No Stress Concern Present (08/29/2023)   [employer] of Occupational Health - Occupational Stress Questionnaire    Feeling of Stress : Not at all  Social Connections: Moderately Isolated (08/29/2023)   Social Connection and Isolation Panel    Frequency of Communication with Friends and Family: More than three times a week    Frequency of Social Gatherings with Friends and Family: More than three times a week    Attends Religious Services: Never    Database administrator or Organizations: No    Attends Banker Meetings: 1 to 4 times per year    Marital Status: Widowed    Tobacco Counseling Counseling given: Not Answered Tobacco comments: Verified by son, Ray Carlson    Clinical Intake:              Lab Results  Component Value Date   HGBA1C 6.8 (H) 08/15/2024   HGBA1C 8.2 (H) 05/15/2024   HGBA1C 7.0 (H) 02/12/2024               Activities of Daily Living ***      No data to display          Patient Care Team: Dettinger, Fonda LABOR, MD as PCP - General (Family Medicine) Love, Lynwood HERO, MD as Consulting Physician (Neurology) Bakhru, Ricka Bleacher, MD as Referring Physician (Pulmonary Disease) Ladora Ross Lacy Phebe, MD as Referring Physician (Optometry) *** I have updated your Care Teams any recent Medical Services you may have received from other providers in the past year.     Assessment:   This is a routine  wellness examination for [place].  Hearing/Vision screen No results found.   Goals Addressed   None    Depression Screen ***    08/15/2024    3:36 PM 05/15/2024    3:11 PM 02/12/2024    2:59 PM 11/13/2023    3:29 PM 08/29/2023    2:37 PM 08/09/2023    3:24 PM 01/27/2023    1:09 PM  PHQ 2/9 Scores  PHQ - 2 Score 0 0 0 0 0 0 0  PHQ- 9 Score     0 0 0    Fall Risk ***    08/15/2024    3:36 PM 05/15/2024    3:11 PM 02/12/2024    2:59 PM 11/13/2023    3:29 PM 08/29/2023    2:34 PM  Fall Risk   Falls in the past year? 0 0 0 0 0  Number falls in past yr: 0 0   0  Injury with Fall? 0 0   0  Risk for fall due to : Impaired balance/gait;Impaired mobility No Fall Risks   No Fall Risks  Follow up Falls evaluation completed Falls evaluation completed   Falls prevention discussed    MEDICARE RISK AT HOME: ***    TIMED UP AND GO:  Was the test performed?  {AMBTIMEDUPGO:[id number]}  Cognitive Function: {CognitiveScreening:32337}    10/24/2017    3:19 PM  MMSE - Mini Mental State Exam  Orientation to time 5   Orientation to Place 5   Registration 3   Attention/ Calculation 0   Attention/Calculation-comments not attempted   Recall 2   Language- name 2 objects 2   Language- repeat 1  Language- follow 3 step command 3   Language- read & follow direction 1   Write a sentence 0   Write a sentence-comments not attempted   Copy design 0   Total score 22      Data saved with a previous flowsheet row definition        08/29/2023    2:40 PM   6CIT Screen  What Year? 0 points  What month? 0 points  What time? 0 points  Count back from 20 0 points  Months in reverse 0 points  Repeat phrase 0 points  Total Score 0 points    Immunizations Immunization History  Administered Date(s) Administered   Fluad Quad(high Dose 65+) 09/30/2021, 10/24/2022   Fluad Trivalent(High Dose 65+) 10/11/2023   INFLUENZA, HIGH DOSE SEASONAL PF 09/30/2015, 09/06/2017, 09/05/2018, 08/15/2024   Influenza, Quadrivalent, Recombinant, Inj, Pf 09/16/2019   Influenza,inj,Quad PF,6+ Mos 10/14/2014, 09/20/2016   Influenza-Unspecified 10/12/2013   Moderna SARS-COV2 Booster Vaccination 11/15/2020   Moderna Sars-Covid-2 Vaccination 02/19/2020, 03/18/2020   Pneumococcal Conjugate-13 09/01/2015   Pneumococcal Polysaccharide-23 08/12/2010, 12/03/2013   Tdap 12/13/2007, 03/02/2018, 03/02/2018   Zoster Recombinant(Shingrix) 06/30/2021   Zoster, Live 08/04/2014    Screening Tests Health Maintenance  Topic Date Due   OPHTHALMOLOGY EXAM  02/24/2024   COVID-19 Vaccine (4 - 2025-26 season) 08/12/2024   Medicare Annual Wellness (AWV)  08/28/2024   FOOT EXAM  11/12/2024   HEMOGLOBIN A1C  02/12/2025   Diabetic kidney evaluation - eGFR measurement  08/15/2025   Diabetic kidney evaluation - Urine ACR  08/15/2025   DTaP/Tdap/Td (4 - Td or Tdap) 03/02/2028   Pneumococcal Vaccine: 50+ Years  Completed   Influenza Vaccine  Completed   Hepatitis C Screening  Completed   HPV VACCINES  Aged Out   Meningococcal B Vaccine  Aged Out   Colonoscopy  Discontinued   Zoster Vaccines- Shingrix  Discontinued    Health Maintenance Items Addressed: {HMMCR (Optional):30011}  Additional Screening:  Vision Screening: Recommended annual ophthalmology exams for early detection of glaucoma and other disorders of the eye. Is the patient up to date with their annual eye exam?  {YES/NO:21197} Who is the provider or what is the name of the office in which the patient attends  annual eye exams? ***  Dental Screening: Recommended annual dental exams for proper oral hygiene  Community Resource Referral / Chronic Care Management: CRR required this visit?  {YES/NO:21197}  CCM required this visit?  {CCM Required choices:[phone]}   Plan:    I have personally reviewed and noted the following in the patient's chart:   Medical and social history Use of alcohol, tobacco or illicit drugs  Current medications and supplements including opioid prescriptions. {Opioid [phone]} Functional ability and status Nutritional status Physical activity Advanced directives List of other physicians Hospitalizations, surgeries, and ER visits in previous 12 months Vitals Screenings to include cognitive, depression, and falls Referrals and appointments  In addition, I have reviewed and discussed with patient certain preventive protocols, quality metrics, and best practice recommendations. A written personalized care plan for preventive services as well as general preventive health recommendations were provided to patient.   Ozie Ned, CMA   08/29/2024   After Visit Summary: {CHL AMB AWV After Visit Summary:[phone]}  Notes: {Nurse Notes:32343}

## 2024-09-26 DIAGNOSIS — M79674 Pain in right toe(s): Secondary | ICD-10-CM | POA: Diagnosis not present

## 2024-09-26 DIAGNOSIS — B351 Tinea unguium: Secondary | ICD-10-CM | POA: Diagnosis not present

## 2024-09-26 DIAGNOSIS — L84 Corns and callosities: Secondary | ICD-10-CM | POA: Diagnosis not present

## 2024-09-26 DIAGNOSIS — E1142 Type 2 diabetes mellitus with diabetic polyneuropathy: Secondary | ICD-10-CM | POA: Diagnosis not present

## 2024-09-26 DIAGNOSIS — M79675 Pain in left toe(s): Secondary | ICD-10-CM | POA: Diagnosis not present

## 2024-10-17 ENCOUNTER — Other Ambulatory Visit: Payer: Self-pay | Admitting: Family Medicine

## 2024-10-17 DIAGNOSIS — J439 Emphysema, unspecified: Secondary | ICD-10-CM

## 2024-10-31 ENCOUNTER — Ambulatory Visit (INDEPENDENT_AMBULATORY_CARE_PROVIDER_SITE_OTHER)

## 2024-10-31 VITALS — BP 145/78 | HR 75 | Ht 73.0 in | Wt 182.0 lb

## 2024-10-31 DIAGNOSIS — Z0000 Encounter for general adult medical examination without abnormal findings: Secondary | ICD-10-CM | POA: Diagnosis not present

## 2024-10-31 NOTE — Progress Notes (Signed)
 Chief Complaint  Patient presents with   Medicare Wellness     Subjective:   Ray Carlson is a 78 y.o. male who presents for a Medicare Annual Wellness Visit.  Allergies (verified) Penicillins   History: Past Medical History:  Diagnosis Date   Diabetes mellitus without complication (HCC)    Hyperlipidemia    Hypertension    Tobacco user    Vitamin D  deficiency    Past Surgical History:  Procedure Laterality Date   CATARACT EXTRACTION     Family History  Problem Relation Age of Onset   Hypertension Mother    Heart disease Mother    Diabetes Mother    Heart failure Mother    Hypertension Father    Leukemia Father    Diabetes Sister    Diabetes Brother    Healthy Son    Diabetes Brother    Diabetes Brother    Diabetes Sister    Healthy Son    Social History   Occupational History   Occupation: Retired    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  Tobacco Use   Smoking status: Former    Current packs/day: 0.00    Types: Cigarettes    Quit date: 01/01/2016    Years since quitting: 8.8    Passive exposure: Past   Smokeless tobacco: Never   Tobacco comments:    Verified by son, [name]  Vaping Use   Vaping status: Never Used  Substance and Sexual Activity   Alcohol use: No   Drug use: No   Sexual activity: Not Currently    Partners: Female   Tobacco Counseling Counseling given: Yes Tobacco comments: Verified by son, [name]  SDOH Screenings   Food Insecurity: No Food Insecurity (10/31/2024)  Housing: Unknown (10/31/2024)  Transportation Needs: No Transportation Needs (10/31/2024)  Utilities: Not At Risk (10/31/2024)  Alcohol Screen: Low Risk  (08/29/2023)  Depression (PHQ2-9): Low Risk  (10/31/2024)  Financial Resource Strain: Low Risk  (08/29/2023)  Physical Activity: Insufficiently Active (10/31/2024)  Social Connections: Moderately Isolated (08/29/2023)  Stress: No Stress Concern Present (10/31/2024)  Tobacco Use: Medium Risk  (10/31/2024)  Health Literacy: Adequate Health Literacy (10/31/2024)   See flowsheets for full screening details  Depression Screen PHQ 2 & 9 Depression Scale- Over the past 2 weeks, how often have you been bothered by any of the following problems? Little interest or pleasure in doing things: 0 Feeling down, depressed, or hopeless (PHQ Adolescent also includes...irritable): 0 PHQ-2 Total Score: 0 Trouble falling or staying asleep, or sleeping too much: 0 Feeling tired or having little energy: 0 Poor appetite or overeating (PHQ Adolescent also includes...weight loss): 0 Feeling bad about yourself - or that you are a failure or have let yourself or your family down: 0 Trouble concentrating on things, such as reading the newspaper or watching television (PHQ Adolescent also includes...like school work): 0 Moving or speaking so slowly that other people could have noticed. Or the opposite - being so fidgety or restless that you have been moving around a lot more than usual: 0 Thoughts that you would be better off dead, or of hurting yourself in some way: 0 If you checked off any problems, how difficult have these problems made it for you to do your work, take care of things at home, or get along with other people?: Not difficult at all     Goals Addressed             This  Visit's Progress    Activity and Exercise Increased       E     DIET - INCREASE WATER INTAKE   On track      Visit info / Clinical Intake: Medicare Wellness Visit Type:: Subsequent Annual Wellness Visit Persons participating in visit:: patient & caregiver (pt's son, Ray Carlson due to [facility]) Medicare Wellness Visit Mode:: Telephone If telephone:: video declined Because this visit was a virtual/telehealth visit:: vitals recorded from last visit If Telephone or Video please confirm:: I connected with the patient using audio enabled telemedicine application and verified that I am speaking with the correct person using two  identifiers Patient Location:: home Provider Location:: home office Information given by:: family (pt's son, Ray Carlson) Interpreter Needed?: No Pre-visit prep was completed: yes AWV questionnaire completed by patient prior to visit?: no Living arrangements:: (!) lives alone Patient's Overall Health Status Rating: very good Typical amount of pain: none Does pain affect daily life?: no Are you currently prescribed opioids?: no  Dietary Habits and Nutritional Risks How many meals a day?: 3 Eats fruit and vegetables daily?: yes Most meals are obtained by: preparing own meals In the last 2 weeks, have you had any of the following?: none Diabetic:: (!) yes Any non-healing wounds?: no How often do you check your BS?: -- (everyday) Would you like to be referred to a Nutritionist or for Diabetic Management? : no  Functional Status Ambulation: Independent with device- listed below Home Assistive Devices/Equipment: Ray Carlson (specify Type) Medication Administration: Independent Home Management: Independent Manage your own finances?: yes Primary transportation is: driving Concerns about hearing?: no  Fall Screening Falls in the past year?: 0 Number of falls in past year: 0 Was there an injury with Fall?: 0 Fall Risk Category Calculator: 0 Patient Fall Risk Level: Low Fall Risk  Fall Risk Patient at Risk for Falls Due to: No Fall Risks Fall risk Follow up: Falls evaluation completed; Education provided  Home and Transportation Safety: All rugs have non-skid backing?: yes All stairs or steps have railings?: (!) no Grab bars in the bathtub or shower?: yes Have non-skid surface in bathtub or shower?: yes Good home lighting?: yes Regular seat belt use?: yes Hospital stays in the last year:: no  Cognitive Assessment Difficulty concentrating, remembering, or making decisions? : no Will 6CIT or Mini Cog be Completed: no 6CIT or Mini Cog Declined: patient alert, oriented, able to  answer questions appropriately and recall recent events  Advance Directives (For Healthcare) Does Patient Have a Medical Advance Directive?: No Would patient like information on creating a medical advance directive?: No - Patient declined  Reviewed/Updated  Reviewed/Updated: Reviewed All (Medical, Surgical, Family, Medications, Allergies, Care Teams, Patient Goals); Medical History; Surgical History; Family History; Medications; Allergies; Care Teams; Patient Goals        Objective:    Today's Vitals   10/31/24 1135  BP: (!) 145/78  Pulse: 75  Weight: 182 lb (82.6 kg)  Height: 6' 1 (1.854 m)   Body mass index is 24.01 kg/m.  Current Medications (verified) Outpatient Encounter Medications as of 10/31/2024  Medication Sig   Accu-Chek Softclix Lancets lancets CHECK BLOOD GLUCOSE DAILY AS DIRECTED   albuterol  (VENTOLIN  HFA) 108 (90 Base) MCG/ACT inhaler INHALE 2 PUFFS EVERY 6 HOURS AS NEEDED FOR WHEEZING OR SHORTNESS OF BREATH   Blood Glucose Monitoring Suppl (ACCU-CHEK AVIVA PLUS) w/Device KIT 1 each by Does not apply route daily.   BREO ELLIPTA  100-25 MCG/ACT AEPB Inhale 1 puff into the lungs  daily.   Cyanocobalamin (VITAMIN B 12 PO) Take 1,000 mg by mouth daily.   diclofenac  Sodium (VOLTAREN ) 1 % GEL Apply 2 g topically 4 (four) times daily.   furosemide  (LASIX ) 20 MG tablet Take 1 tablet (20 mg total) by mouth daily.   glipiZIDE  (GLUCOTROL ) 5 MG tablet Take 1 tablet (5 mg total) by mouth 2 (two) times daily.   glucose blood (ACCU-CHEK AVIVA PLUS) test strip Test bid. E11.9   lisinopril  (ZESTRIL ) 10 MG tablet Take 1 tablet (10 mg total) by mouth daily.   metFORMIN  (GLUCOPHAGE ) 500 MG tablet Take 2 tablets (1,000 mg total) by mouth 2 (two) times daily with a meal.   metoprolol  tartrate (LOPRESSOR ) 50 MG tablet Take 1 tablet (50 mg total) by mouth 2 (two) times daily.   nystatin  (MYCOSTATIN ) 100000 UNIT/ML suspension Take 5 mLs (500,000 Units total) by mouth 4 (four) times daily.    pravastatin  (PRAVACHOL ) 20 MG tablet Take 1 tablet (20 mg total) by mouth daily.   sitaGLIPtin  (JANUVIA ) 100 MG tablet Take 1 tablet (100 mg total) by mouth daily.   SPIRIVA  HANDIHALER 18 MCG CAPS INHALE THE CONTENTS OF ONE CAPSULE DAILY AS DIRECTED   No facility-administered encounter medications on file as of 10/31/2024.   Hearing/Vision screen Hearing Screening - Comments:: Pt has hard dif Vision Screening - Comments:: Pt wear reading glasses/pt goes to [employer] last ov 2025 Immunizations and Health Maintenance Health Maintenance  Topic Date Due   OPHTHALMOLOGY EXAM  02/24/2024   COVID-19 Vaccine (4 - 2025-26 season) 08/12/2024   FOOT EXAM  11/12/2024   HEMOGLOBIN A1C  02/12/2025   Diabetic kidney evaluation - eGFR measurement  08/15/2025   Diabetic kidney evaluation - Urine ACR  08/15/2025   Medicare Annual Wellness (AWV)  10/31/2025   DTaP/Tdap/Td (4 - Td or Tdap) 03/02/2028   Pneumococcal Vaccine: 50+ Years  Completed   Influenza Vaccine  Completed   Hepatitis C Screening  Completed   Meningococcal B Vaccine  Aged Out   Colonoscopy  Discontinued   Zoster Vaccines- Shingrix  Discontinued        Assessment/Plan:  This is a routine wellness examination for Ray Carlson.  Patient Care Team: Dettinger, Fonda LABOR, MD as PCP - General (Family Medicine) Love, Lynwood HERO, MD as Consulting Physician (Neurology) Bakhru, Ricka Bleacher, MD as Referring Physician (Pulmonary Disease) Ladora Ross Lacy Phebe, MD as Referring Physician [facility])  I have personally reviewed and noted the following in the patient's chart:   Medical and social history Use of alcohol, tobacco or illicit drugs  Current medications and supplements including opioid prescriptions. Functional ability and status Nutritional status Physical activity Advanced directives List of other physicians Hospitalizations, surgeries, and ER visits in previous 12 months Vitals Screenings to include cognitive,  depression, and falls Referrals and appointments  No orders of the defined types were placed in this encounter.  In addition, I have reviewed and discussed with patient certain preventive protocols, quality metrics, and best practice recommendations. A written personalized care plan for preventive services as well as general preventive health recommendations were provided to patient.   Ozie Ned, CMA   10/31/2024   Return in 1 year (on 10/31/2025).  After Visit Summary: (MyChart) Due to this being a telephonic visit, the after visit summary with patients personalized plan was offered to patient via MyChart   Nurse Notes: send ltr request for records diabetic eye exam/per pt's son, Ray Carlson, it has already been done

## 2024-11-15 ENCOUNTER — Ambulatory Visit: Payer: Self-pay | Admitting: Family Medicine

## 2024-11-15 ENCOUNTER — Encounter: Payer: Self-pay | Admitting: Family Medicine

## 2024-11-15 VITALS — BP 149/63 | HR 72 | Ht 73.0 in | Wt 181.0 lb

## 2024-11-15 DIAGNOSIS — J439 Emphysema, unspecified: Secondary | ICD-10-CM

## 2024-11-15 DIAGNOSIS — E1169 Type 2 diabetes mellitus with other specified complication: Secondary | ICD-10-CM

## 2024-11-15 DIAGNOSIS — E1159 Type 2 diabetes mellitus with other circulatory complications: Secondary | ICD-10-CM

## 2024-11-15 LAB — BAYER DCA HB A1C WAIVED: HB A1C (BAYER DCA - WAIVED): 6.3 % — ABNORMAL HIGH (ref 4.8–5.6)

## 2024-11-15 MED ORDER — METFORMIN HCL 500 MG PO TABS: 1000.0000 mg | ORAL_TABLET | Freq: Two times a day (BID) | ORAL | 3 refills | Status: AC

## 2024-11-15 MED ORDER — SITAGLIPTIN PHOSPHATE 100 MG PO TABS: 100.0000 mg | ORAL_TABLET | Freq: Every day | ORAL | 3 refills | Status: AC

## 2024-11-15 MED ORDER — METOPROLOL TARTRATE 50 MG PO TABS: 50.0000 mg | ORAL_TABLET | Freq: Two times a day (BID) | ORAL | 3 refills | Status: AC

## 2024-11-15 MED ORDER — BREO ELLIPTA 100-25 MCG/ACT IN AEPB: 1.0000 | INHALATION_SPRAY | Freq: Every day | RESPIRATORY_TRACT | 3 refills | Status: AC

## 2024-11-15 MED ORDER — TIOTROPIUM BROMIDE 18 MCG IN CAPS: 18.0000 ug | ORAL_CAPSULE | Freq: Every day | RESPIRATORY_TRACT | 3 refills | Status: AC

## 2024-11-15 MED ORDER — PRAVASTATIN SODIUM 20 MG PO TABS: 20.0000 mg | ORAL_TABLET | Freq: Every day | ORAL | 3 refills | Status: AC

## 2024-11-15 MED ORDER — GLIPIZIDE 5 MG PO TABS: 5.0000 mg | ORAL_TABLET | Freq: Two times a day (BID) | ORAL | 3 refills | Status: AC

## 2024-11-15 NOTE — Progress Notes (Unsigned)
 BP (!) 149/63   Pulse 72   Ht 6' 1 (1.854 m)   Wt 181 lb (82.1 kg)   SpO2 (!) 2%   BMI 23.88 kg/m    Subjective:   Patient ID: Ray Carlson, male    DOB: December 20, 1945, 78 y.o.   MRN: [id number]  HPI: Ray Carlson is a 78 y.o. male presenting on 11/15/2024 for Medical Management of Chronic Issues and Diabetes   Discussed the use of AI scribe software for clinical note transcription with the patient, who gave verbal consent to proceed.  History of Present Illness   He is a 78 year old male with diabetes and COPD who presents for a recheck of his conditions.  Glycemic control - Recent hemoglobin A1c is 6.3%. - He avoids sweet foods and soft drinks, preferring water and green tea. - He allows himself sweets occasionally during holidays.  Chronic obstructive pulmonary disease (copd) symptoms and management - Continues daily use of Spiriva  and Breo inhalers. - Inhalers are effective when used correctly. - No recent exacerbations of emphysema or COPD. - No recent wheezing.  Peripheral edema - History of swelling, currently described as mild. - Improvement attributed to increased water intake.          Relevant past medical, surgical, family and social history reviewed and updated as indicated. Interim medical history since our last visit reviewed. Allergies and medications reviewed and updated.  Review of Systems  Constitutional:  Negative for chills and fever.  Respiratory:  Negative for cough, shortness of breath and wheezing.   Cardiovascular:  Positive for leg swelling. Negative for chest pain and palpitations.  Musculoskeletal:  Negative for back pain and gait problem.  Skin:  Negative for rash.  All other systems reviewed and are negative.   Per HPI unless specifically indicated above   Allergies as of 11/15/2024       Reactions   Penicillins         Medication List        Accurate as of November 15, 2024  4:34 PM. If you have any questions, ask your  nurse or doctor.          Accu-Chek Aviva Plus test strip Generic drug: glucose blood Test bid. E11.9   Accu-Chek Aviva Plus w/Device Kit 1 each by Does not apply route daily.   Accu-Chek Softclix Lancets lancets CHECK BLOOD GLUCOSE DAILY AS DIRECTED   albuterol  108 (90 Base) MCG/ACT inhaler Commonly known as: VENTOLIN  HFA INHALE 2 PUFFS EVERY 6 HOURS AS NEEDED FOR WHEEZING OR SHORTNESS OF BREATH   Breo Ellipta  100-25 MCG/ACT Aepb Generic drug: fluticasone  furoate-vilanterol Inhale 1 puff into the lungs daily.   diclofenac  Sodium 1 % Gel Commonly known as: Voltaren  Apply 2 g topically 4 (four) times daily.   furosemide  20 MG tablet Commonly known as: LASIX  Take 1 tablet (20 mg total) by mouth daily.   glipiZIDE  5 MG tablet Commonly known as: GLUCOTROL  Take 1 tablet (5 mg total) by mouth 2 (two) times daily.   lisinopril  10 MG tablet Commonly known as: ZESTRIL  Take 1 tablet (10 mg total) by mouth daily.   metFORMIN  500 MG tablet Commonly known as: GLUCOPHAGE  Take 2 tablets (1,000 mg total) by mouth 2 (two) times daily with a meal.   metoprolol  tartrate 50 MG tablet Commonly known as: LOPRESSOR  Take 1 tablet (50 mg total) by mouth 2 (two) times daily.   nystatin  100000 UNIT/ML suspension Commonly known as: MYCOSTATIN  Take 5 mLs (  500,000 Units total) by mouth 4 (four) times daily.   pravastatin  20 MG tablet Commonly known as: PRAVACHOL  Take 1 tablet (20 mg total) by mouth daily.   sitaGLIPtin  100 MG tablet Commonly known as: Januvia  Take 1 tablet (100 mg total) by mouth daily.   Tiotropium Bromide  18 MCG Caps Commonly known as: Spiriva  HandiHaler Place 18 mcg into inhaler and inhale daily at 12 noon. What changed: See the new instructions. Changed by: Fonda LABOR Zannah Melucci   VITAMIN B 12 PO Take 1,000 mg by mouth daily.         Objective:   BP (!) 149/63   Pulse 72   Ht 6' 1 (1.854 m)   Wt 181 lb (82.1 kg)   SpO2 (!) 2%   BMI 23.88 kg/m    Wt Readings from Last 3 Encounters:  11/15/24 181 lb (82.1 kg)  10/31/24 182 lb (82.6 kg)  08/15/24 182 lb (82.6 kg)    Physical Exam Vitals and nursing note reviewed.  Constitutional:      General: He is not in acute distress.    Appearance: He is well-developed. He is not diaphoretic.  Eyes:     General: No scleral icterus.    Conjunctiva/sclera: Conjunctivae normal.  Neck:     Thyroid: No thyromegaly.  Cardiovascular:     Rate and Rhythm: Normal rate and regular rhythm.     Heart sounds: Normal heart sounds. No murmur heard. Pulmonary:     Effort: Pulmonary effort is normal. No respiratory distress.     Breath sounds: Normal breath sounds. No wheezing.  Musculoskeletal:        General: Swelling (2+ peripheral edema) present. Normal range of motion.  Skin:    General: Skin is warm and dry.     Findings: No erythema or rash.  Neurological:     Mental Status: He is alert and oriented to person, place, and time.     Coordination: Coordination normal.  Psychiatric:        Behavior: Behavior normal.    Physical Exam   VITALS: BP- 149/63, SaO2- 93% CHEST: Lungs clear to auscultation with slight rattle. CARDIOVASCULAR: Heart sounds normal, no murmurs, regular rhythm.         Assessment & Plan:   Problem List Items Addressed This Visit       Cardiovascular and Mediastinum   Hypertension associated with diabetes (HCC)   Relevant Medications   glipiZIDE  (GLUCOTROL ) 5 MG tablet   metFORMIN  (GLUCOPHAGE ) 500 MG tablet   metoprolol  tartrate (LOPRESSOR ) 50 MG tablet   pravastatin  (PRAVACHOL ) 20 MG tablet   sitaGLIPtin  (JANUVIA ) 100 MG tablet     Respiratory   COPD (chronic obstructive pulmonary disease) (HCC)   Relevant Medications   BREO ELLIPTA  100-25 MCG/ACT AEPB   Tiotropium Bromide  (SPIRIVA  HANDIHALER) 18 MCG CAPS     Endocrine   Hyperlipidemia associated with type 2 diabetes mellitus (HCC)   Relevant Medications   glipiZIDE  (GLUCOTROL ) 5 MG tablet    metFORMIN  (GLUCOPHAGE ) 500 MG tablet   metoprolol  tartrate (LOPRESSOR ) 50 MG tablet   pravastatin  (PRAVACHOL ) 20 MG tablet   sitaGLIPtin  (JANUVIA ) 100 MG tablet   Type 2 diabetes mellitus with other specified complication (HCC) - Primary   Relevant Medications   glipiZIDE  (GLUCOTROL ) 5 MG tablet   metFORMIN  (GLUCOPHAGE ) 500 MG tablet   pravastatin  (PRAVACHOL ) 20 MG tablet   sitaGLIPtin  (JANUVIA ) 100 MG tablet   Other Relevant Orders   Bayer DCA Hb A1c Waived   Other  Visit Diagnoses       Pulmonary emphysema (HCC)       Relevant Medications   BREO ELLIPTA  100-25 MCG/ACT AEPB   Tiotropium Bromide  (SPIRIVA  HANDIHALER) 18 MCG CAPS          Type 2 diabetes mellitus Well-controlled with A1c of 6.3%. - Continue current dietary habits, avoiding sweets and soft drinks. - Allow occasional sweets during holidays.  Emphysema Well-managed with no recent flare-ups. Effective use of Spiriva  and Breo inhalers. Oxygen saturation at 93%. - Continue daily use of Spiriva  and Breo inhalers. - Sent refills for inhalers.          Follow up plan: Return in about 3 months (around 02/13/2025), or if symptoms worsen or fail to improve, for Diabetes and hyperlipidemia and COPD.  Counseling provided for all of the vaccine components Orders Placed This Encounter  Procedures   Bayer DCA Hb A1c Waived    Fonda Levins, MD [facility] 11/15/2024, 4:34 PM

## 2024-11-26 NOTE — Progress Notes (Unsigned)
 This encounter was created in error - please disregard.

## 2025-02-26 ENCOUNTER — Ambulatory Visit: Admitting: Family Medicine

## 2025-03-03 ENCOUNTER — Ambulatory Visit: Admitting: Family Medicine
